# Patient Record
Sex: Male | Born: 1990 | Race: White | Hispanic: No | Marital: Single
Health system: Southern US, Community
[De-identification: ages and names within clinical notes are randomized; demographics above are authoritative.]

## PROBLEM LIST (undated history)

## (undated) DIAGNOSIS — Z915 Personal history of self-harm: Secondary | ICD-10-CM

## (undated) DIAGNOSIS — Z9151 Personal history of suicidal behavior: Secondary | ICD-10-CM

## (undated) DIAGNOSIS — F419 Anxiety disorder, unspecified: Secondary | ICD-10-CM

## (undated) DIAGNOSIS — F329 Major depressive disorder, single episode, unspecified: Secondary | ICD-10-CM

## (undated) DIAGNOSIS — F1910 Other psychoactive substance abuse, uncomplicated: Secondary | ICD-10-CM

## (undated) DIAGNOSIS — F32A Depression, unspecified: Secondary | ICD-10-CM

## (undated) DIAGNOSIS — Q860 Fetal alcohol syndrome (dysmorphic): Secondary | ICD-10-CM

---

## 2018-06-13 ENCOUNTER — Other Ambulatory Visit: Payer: Self-pay

## 2018-06-13 ENCOUNTER — Emergency Department (HOSPITAL_COMMUNITY): Payer: BLUE CROSS/BLUE SHIELD

## 2018-06-13 ENCOUNTER — Inpatient Hospital Stay (HOSPITAL_COMMUNITY): Payer: BLUE CROSS/BLUE SHIELD

## 2018-06-13 ENCOUNTER — Inpatient Hospital Stay (HOSPITAL_COMMUNITY)
Admission: EM | Admit: 2018-06-13 | Discharge: 2018-07-14 | DRG: 004 | Disposition: A | Discharge status: Other Acute Inpatient Hospital | Payer: BLUE CROSS/BLUE SHIELD | Attending: Family Medicine | Admitting: Family Medicine

## 2018-06-13 DIAGNOSIS — T50904A Poisoning by unspecified drugs, medicaments and biological substances, undetermined, initial encounter: Secondary | ICD-10-CM | POA: Diagnosis not present

## 2018-06-13 DIAGNOSIS — J150 Pneumonia due to Klebsiella pneumoniae: Secondary | ICD-10-CM | POA: Diagnosis present

## 2018-06-13 DIAGNOSIS — G931 Anoxic brain damage, not elsewhere classified: Secondary | ICD-10-CM | POA: Diagnosis present

## 2018-06-13 DIAGNOSIS — Q860 Fetal alcohol syndrome (dysmorphic): Secondary | ICD-10-CM

## 2018-06-13 DIAGNOSIS — Z7951 Long term (current) use of inhaled steroids: Secondary | ICD-10-CM

## 2018-06-13 DIAGNOSIS — F1010 Alcohol abuse, uncomplicated: Secondary | ICD-10-CM | POA: Diagnosis present

## 2018-06-13 DIAGNOSIS — Y95 Nosocomial condition: Secondary | ICD-10-CM | POA: Diagnosis not present

## 2018-06-13 DIAGNOSIS — F909 Attention-deficit hyperactivity disorder, unspecified type: Secondary | ICD-10-CM | POA: Diagnosis present

## 2018-06-13 DIAGNOSIS — Z79899 Other long term (current) drug therapy: Secondary | ICD-10-CM

## 2018-06-13 DIAGNOSIS — E46 Unspecified protein-calorie malnutrition: Secondary | ICD-10-CM | POA: Diagnosis not present

## 2018-06-13 DIAGNOSIS — Z930 Tracheostomy status: Secondary | ICD-10-CM

## 2018-06-13 DIAGNOSIS — E875 Hyperkalemia: Secondary | ICD-10-CM | POA: Diagnosis present

## 2018-06-13 DIAGNOSIS — F329 Major depressive disorder, single episode, unspecified: Secondary | ICD-10-CM | POA: Diagnosis present

## 2018-06-13 DIAGNOSIS — F1910 Other psychoactive substance abuse, uncomplicated: Secondary | ICD-10-CM | POA: Diagnosis present

## 2018-06-13 DIAGNOSIS — J9811 Atelectasis: Secondary | ICD-10-CM | POA: Diagnosis not present

## 2018-06-13 DIAGNOSIS — Z681 Body mass index (BMI) 19 or less, adult: Secondary | ICD-10-CM

## 2018-06-13 DIAGNOSIS — T5891XA Toxic effect of carbon monoxide from unspecified source, accidental (unintentional), initial encounter: Secondary | ICD-10-CM | POA: Diagnosis present

## 2018-06-13 DIAGNOSIS — E861 Hypovolemia: Secondary | ICD-10-CM | POA: Diagnosis present

## 2018-06-13 DIAGNOSIS — G92 Toxic encephalopathy: Secondary | ICD-10-CM | POA: Diagnosis present

## 2018-06-13 DIAGNOSIS — Z888 Allergy status to other drugs, medicaments and biological substances status: Secondary | ICD-10-CM

## 2018-06-13 DIAGNOSIS — A4159 Other Gram-negative sepsis: Secondary | ICD-10-CM | POA: Diagnosis not present

## 2018-06-13 DIAGNOSIS — M6282 Rhabdomyolysis: Secondary | ICD-10-CM | POA: Diagnosis present

## 2018-06-13 DIAGNOSIS — R569 Unspecified convulsions: Secondary | ICD-10-CM | POA: Diagnosis present

## 2018-06-13 DIAGNOSIS — E876 Hypokalemia: Secondary | ICD-10-CM | POA: Diagnosis not present

## 2018-06-13 DIAGNOSIS — R748 Abnormal levels of other serum enzymes: Secondary | ICD-10-CM | POA: Diagnosis not present

## 2018-06-13 DIAGNOSIS — Z7189 Other specified counseling: Secondary | ICD-10-CM

## 2018-06-13 DIAGNOSIS — F1210 Cannabis abuse, uncomplicated: Secondary | ICD-10-CM | POA: Diagnosis present

## 2018-06-13 DIAGNOSIS — R4182 Altered mental status, unspecified: Secondary | ICD-10-CM | POA: Diagnosis present

## 2018-06-13 DIAGNOSIS — Z9911 Dependence on respirator [ventilator] status: Secondary | ICD-10-CM

## 2018-06-13 DIAGNOSIS — J9621 Acute and chronic respiratory failure with hypoxia: Secondary | ICD-10-CM

## 2018-06-13 DIAGNOSIS — J9602 Acute respiratory failure with hypercapnia: Secondary | ICD-10-CM | POA: Diagnosis present

## 2018-06-13 DIAGNOSIS — F1920 Other psychoactive substance dependence, uncomplicated: Secondary | ICD-10-CM | POA: Diagnosis not present

## 2018-06-13 DIAGNOSIS — Z931 Gastrostomy status: Secondary | ICD-10-CM

## 2018-06-13 DIAGNOSIS — K7200 Acute and subacute hepatic failure without coma: Secondary | ICD-10-CM | POA: Diagnosis present

## 2018-06-13 DIAGNOSIS — N179 Acute kidney failure, unspecified: Secondary | ICD-10-CM | POA: Diagnosis present

## 2018-06-13 DIAGNOSIS — J189 Pneumonia, unspecified organism: Secondary | ICD-10-CM

## 2018-06-13 DIAGNOSIS — Z791 Long term (current) use of non-steroidal anti-inflammatories (NSAID): Secondary | ICD-10-CM

## 2018-06-13 DIAGNOSIS — Z7982 Long term (current) use of aspirin: Secondary | ICD-10-CM

## 2018-06-13 DIAGNOSIS — L8990 Pressure ulcer of unspecified site, unspecified stage: Secondary | ICD-10-CM

## 2018-06-13 DIAGNOSIS — T50902A Poisoning by unspecified drugs, medicaments and biological substances, intentional self-harm, initial encounter: Secondary | ICD-10-CM | POA: Diagnosis not present

## 2018-06-13 DIAGNOSIS — F419 Anxiety disorder, unspecified: Secondary | ICD-10-CM | POA: Diagnosis present

## 2018-06-13 DIAGNOSIS — R579 Shock, unspecified: Secondary | ICD-10-CM | POA: Diagnosis present

## 2018-06-13 DIAGNOSIS — R1319 Other dysphagia: Secondary | ICD-10-CM

## 2018-06-13 DIAGNOSIS — R0682 Tachypnea, not elsewhere classified: Secondary | ICD-10-CM

## 2018-06-13 DIAGNOSIS — R4020 Unspecified coma: Secondary | ICD-10-CM | POA: Diagnosis not present

## 2018-06-13 DIAGNOSIS — Z1159 Encounter for screening for other viral diseases: Secondary | ICD-10-CM

## 2018-06-13 DIAGNOSIS — J690 Pneumonitis due to inhalation of food and vomit: Secondary | ICD-10-CM | POA: Diagnosis present

## 2018-06-13 DIAGNOSIS — T50901A Poisoning by unspecified drugs, medicaments and biological substances, accidental (unintentional), initial encounter: Secondary | ICD-10-CM | POA: Diagnosis present

## 2018-06-13 DIAGNOSIS — J9601 Acute respiratory failure with hypoxia: Secondary | ICD-10-CM | POA: Diagnosis not present

## 2018-06-13 DIAGNOSIS — R339 Retention of urine, unspecified: Secondary | ICD-10-CM | POA: Diagnosis not present

## 2018-06-13 DIAGNOSIS — Z515 Encounter for palliative care: Secondary | ICD-10-CM

## 2018-06-13 DIAGNOSIS — R402431 Glasgow coma scale score 3-8, in the field [EMT or ambulance]: Secondary | ICD-10-CM | POA: Diagnosis not present

## 2018-06-13 DIAGNOSIS — F1410 Cocaine abuse, uncomplicated: Secondary | ICD-10-CM | POA: Diagnosis present

## 2018-06-13 DIAGNOSIS — Z915 Personal history of self-harm: Secondary | ICD-10-CM

## 2018-06-13 DIAGNOSIS — R0902 Hypoxemia: Secondary | ICD-10-CM

## 2018-06-13 DIAGNOSIS — R4702 Dysphasia: Secondary | ICD-10-CM | POA: Diagnosis not present

## 2018-06-13 DIAGNOSIS — R1310 Dysphagia, unspecified: Secondary | ICD-10-CM

## 2018-06-13 DIAGNOSIS — J9690 Respiratory failure, unspecified, unspecified whether with hypoxia or hypercapnia: Secondary | ICD-10-CM

## 2018-06-13 DIAGNOSIS — F1120 Opioid dependence, uncomplicated: Secondary | ICD-10-CM | POA: Diagnosis present

## 2018-06-13 DIAGNOSIS — Z9289 Personal history of other medical treatment: Secondary | ICD-10-CM

## 2018-06-13 DIAGNOSIS — D62 Acute posthemorrhagic anemia: Secondary | ICD-10-CM | POA: Diagnosis not present

## 2018-06-13 DIAGNOSIS — Z720 Tobacco use: Secondary | ICD-10-CM

## 2018-06-13 DIAGNOSIS — Z66 Do not resuscitate: Secondary | ICD-10-CM | POA: Diagnosis not present

## 2018-06-13 HISTORY — DX: Anxiety disorder, unspecified: F41.9

## 2018-06-13 HISTORY — DX: Fetal alcohol syndrome (dysmorphic): Q86.0

## 2018-06-13 HISTORY — DX: Major depressive disorder, single episode, unspecified: F32.9

## 2018-06-13 HISTORY — DX: Personal history of self-harm: Z91.5

## 2018-06-13 HISTORY — DX: Depression, unspecified: F32.A

## 2018-06-13 HISTORY — DX: Other psychoactive substance abuse, uncomplicated: F19.10

## 2018-06-13 HISTORY — DX: Personal history of suicidal behavior: Z91.51

## 2018-06-13 LAB — CBC WITH DIFFERENTIAL/PLATELET
Abs Immature Granulocytes: 0.9 10*3/uL — ABNORMAL HIGH (ref 0.00–0.07)
Basophils Absolute: 0 10*3/uL (ref 0.0–0.1)
Basophils Relative: 0 %
Eosinophils Absolute: 0 10*3/uL (ref 0.0–0.5)
Eosinophils Relative: 0 %
HCT: 43.9 % (ref 39.0–52.0)
Hemoglobin: 14.3 g/dL (ref 13.0–17.0)
Lymphocytes Relative: 3 %
Lymphs Abs: 0.9 10*3/uL (ref 0.7–4.0)
MCH: 30.8 pg (ref 26.0–34.0)
MCHC: 32.6 g/dL (ref 30.0–36.0)
MCV: 94.4 fL (ref 80.0–100.0)
Monocytes Absolute: 3.9 10*3/uL — ABNORMAL HIGH (ref 0.1–1.0)
Monocytes Relative: 13 %
Myelocytes: 1 %
Neutro Abs: 24.2 10*3/uL — ABNORMAL HIGH (ref 1.7–7.7)
Neutrophils Relative %: 81 %
Platelets: 264 10*3/uL (ref 150–400)
Promyelocytes Relative: 2 %
RBC: 4.65 MIL/uL (ref 4.22–5.81)
RDW: 12.5 % (ref 11.5–15.5)
WBC: 29.9 10*3/uL — ABNORMAL HIGH (ref 4.0–10.5)
nRBC: 0 % (ref 0.0–0.2)
nRBC: 0 /100 WBC

## 2018-06-13 LAB — POCT I-STAT 7, (LYTES, BLD GAS, ICA,H+H)
Acid-base deficit: 3 mmol/L — ABNORMAL HIGH (ref 0.0–2.0)
Bicarbonate: 22 mmol/L (ref 20.0–28.0)
Calcium, Ion: 1.12 mmol/L — ABNORMAL LOW (ref 1.15–1.40)
HCT: 33 % — ABNORMAL LOW (ref 39.0–52.0)
Hemoglobin: 11.2 g/dL — ABNORMAL LOW (ref 13.0–17.0)
O2 Saturation: 100 %
Potassium: 4.5 mmol/L (ref 3.5–5.1)
Sodium: 142 mmol/L (ref 135–145)
TCO2: 23 mmol/L (ref 22–32)
pCO2 arterial: 39.7 mmHg (ref 32.0–48.0)
pH, Arterial: 7.352 (ref 7.350–7.450)
pO2, Arterial: 261 mmHg — ABNORMAL HIGH (ref 83.0–108.0)

## 2018-06-13 LAB — COMPREHENSIVE METABOLIC PANEL
ALT: 1201 U/L — ABNORMAL HIGH (ref 0–44)
AST: 940 U/L — ABNORMAL HIGH (ref 15–41)
Albumin: 4 g/dL (ref 3.5–5.0)
Alkaline Phosphatase: 66 U/L (ref 38–126)
Anion gap: 14 (ref 5–15)
BUN: 18 mg/dL (ref 6–20)
CO2: 22 mmol/L (ref 22–32)
Calcium: 9.2 mg/dL (ref 8.9–10.3)
Chloride: 106 mmol/L (ref 98–111)
Creatinine, Ser: 1.81 mg/dL — ABNORMAL HIGH (ref 0.61–1.24)
GFR calc Af Amer: 59 mL/min — ABNORMAL LOW (ref >60)
GFR calc non Af Amer: 51 mL/min — ABNORMAL LOW (ref >60)
Glucose, Bld: 124 mg/dL — ABNORMAL HIGH (ref 70–99)
Potassium: 5.5 mmol/L — ABNORMAL HIGH (ref 3.5–5.1)
Sodium: 142 mmol/L (ref 135–145)
Total Bilirubin: 0.4 mg/dL (ref 0.3–1.2)
Total Protein: 7 g/dL (ref 6.5–8.1)

## 2018-06-13 LAB — RAPID URINE DRUG SCREEN, HOSP PERFORMED
Amphetamines: POSITIVE — AB
Barbiturates: NOT DETECTED
Benzodiazepines: POSITIVE — AB
Cocaine: POSITIVE — AB
Opiates: NOT DETECTED
Tetrahydrocannabinol: NOT DETECTED

## 2018-06-13 LAB — URINALYSIS, ROUTINE W REFLEX MICROSCOPIC
Bilirubin Urine: NEGATIVE
Glucose, UA: NEGATIVE mg/dL
Ketones, ur: NEGATIVE mg/dL
Leukocytes,Ua: NEGATIVE
Nitrite: NEGATIVE
Protein, ur: 100 mg/dL — AB
Specific Gravity, Urine: 1.011 (ref 1.005–1.030)
pH: 6 (ref 5.0–8.0)

## 2018-06-13 LAB — PROTIME-INR
INR: 1.2 (ref 0.8–1.2)
Prothrombin Time: 15.4 seconds — ABNORMAL HIGH (ref 11.4–15.2)

## 2018-06-13 LAB — SALICYLATE LEVEL: Salicylate Lvl: <7 mg/dL (ref 2.8–30.0)

## 2018-06-13 LAB — COOXEMETRY PANEL
Carboxyhemoglobin: 1.8 % — ABNORMAL HIGH (ref 0.5–1.5)
Methemoglobin: 1.8 % — ABNORMAL HIGH (ref 0.0–1.5)
O2 Saturation: 42.8 %
Total hemoglobin: 9.6 g/dL — ABNORMAL LOW (ref 12.0–16.0)

## 2018-06-13 LAB — PHOSPHORUS: Phosphorus: 4.8 mg/dL — ABNORMAL HIGH (ref 2.5–4.6)

## 2018-06-13 LAB — ACETAMINOPHEN LEVEL: Acetaminophen (Tylenol), Serum: <10 ug/mL — ABNORMAL LOW (ref 10–30)

## 2018-06-13 LAB — CK: Total CK: 511 U/L — ABNORMAL HIGH (ref 49–397)

## 2018-06-13 LAB — ETHANOL: Alcohol, Ethyl (B): <10 mg/dL (ref <10)

## 2018-06-13 LAB — PROCALCITONIN: Procalcitonin: 21.28 ng/mL

## 2018-06-13 LAB — LACTIC ACID, PLASMA
Lactic Acid, Venous: 7.2 mmol/L (ref 0.5–1.9)
Lactic Acid, Venous: 4.2 mmol/L (ref 0.5–1.9)

## 2018-06-13 LAB — MAGNESIUM: Magnesium: 1.8 mg/dL (ref 1.7–2.4)

## 2018-06-13 LAB — MRSA PCR SCREENING: MRSA by PCR: NEGATIVE

## 2018-06-13 LAB — SARS CORONAVIRUS 2 BY RT PCR (HOSPITAL ORDER, PERFORMED IN ~~LOC~~ HOSPITAL LAB): SARS Coronavirus 2: NEGATIVE

## 2018-06-13 MED ORDER — SODIUM CHLORIDE 0.9 % IV SOLN
INTRAVENOUS | Status: AC | PRN
  Administered 2018-06-13: 1000 mL via INTRAVENOUS

## 2018-06-13 MED ORDER — FENTANYL CITRATE (PF) 100 MCG/2ML IJ SOLN
50.0000 ug | INTRAMUSCULAR | Status: DC | PRN
  Administered 2018-06-13: 20:00:00 50 ug via INTRAVENOUS

## 2018-06-13 MED ORDER — SODIUM CHLORIDE 0.9% FLUSH
3.0000 mL | Freq: Once | INTRAVENOUS | Status: AC
  Administered 2018-06-13: 19:00:00 3 mL via INTRAVENOUS

## 2018-06-13 MED ORDER — ALBUTEROL SULFATE (2.5 MG/3ML) 0.083% IN NEBU: 2.5000 mg | INHALATION_SOLUTION | RESPIRATORY_TRACT | Status: DC | PRN

## 2018-06-13 MED ORDER — FENTANYL CITRATE (PF) 100 MCG/2ML IJ SOLN
INTRAMUSCULAR | Status: AC
  Filled 2018-06-13: qty 2

## 2018-06-13 MED ORDER — CHLORHEXIDINE GLUCONATE 0.12% ORAL RINSE (MEDLINE KIT)
15.0000 mL | Freq: Two times a day (BID) | OROMUCOSAL | Status: DC
  Administered 2018-06-13 – 2018-07-01 (×36): 15 mL via OROMUCOSAL
  Filled 2018-06-13: qty 15

## 2018-06-13 MED ORDER — PROPOFOL 1000 MG/100ML IV EMUL
5.0000 ug/kg/min | INTRAVENOUS | Status: DC
  Administered 2018-06-13: 5 ug/kg/min via INTRAVENOUS

## 2018-06-13 MED ORDER — BISACODYL 10 MG RE SUPP: 10.0000 mg | Freq: Every day | RECTAL | Status: DC | PRN

## 2018-06-13 MED ORDER — ONDANSETRON HCL 4 MG/2ML IJ SOLN: 4.0000 mg | Freq: Four times a day (QID) | INTRAMUSCULAR | Status: DC | PRN

## 2018-06-13 MED ORDER — MIDAZOLAM HCL 2 MG/2ML IJ SOLN
2.0000 mg | INTRAMUSCULAR | Status: DC | PRN
  Administered 2018-06-14: 03:00:00 2 mg via INTRAVENOUS
  Filled 2018-06-13: qty 2

## 2018-06-13 MED ORDER — SODIUM CHLORIDE 0.9 % IV SOLN
250.0000 mL | INTRAVENOUS | Status: DC
  Administered 2018-06-17 – 2018-07-06 (×5): 250 mL via INTRAVENOUS

## 2018-06-13 MED ORDER — PANTOPRAZOLE SODIUM 40 MG PO PACK
40.0000 mg | PACK | Freq: Every day | ORAL | Status: DC
  Administered 2018-06-13 – 2018-06-25 (×12): 40 mg
  Filled 2018-06-13 (×11): qty 20

## 2018-06-13 MED ORDER — LACTATED RINGERS IV BOLUS
1000.0000 mL | Freq: Once | INTRAVENOUS | Status: AC
  Administered 2018-06-13: 19:00:00 1000 mL via INTRAVENOUS

## 2018-06-13 MED ORDER — PHENYLEPHRINE 40 MCG/ML (10ML) SYRINGE FOR IV PUSH (FOR BLOOD PRESSURE SUPPORT)
PREFILLED_SYRINGE | INTRAVENOUS | Status: AC
  Filled 2018-06-13: qty 10

## 2018-06-13 MED ORDER — ETOMIDATE 2 MG/ML IV SOLN
INTRAVENOUS | Status: AC | PRN
  Administered 2018-06-13: 10 mg via INTRAVENOUS

## 2018-06-13 MED ORDER — LORAZEPAM 2 MG/ML IJ SOLN
INTRAMUSCULAR | Status: AC
  Filled 2018-06-13: qty 1

## 2018-06-13 MED ORDER — SODIUM CHLORIDE 0.9 % IV BOLUS
1000.0000 mL | Freq: Once | INTRAVENOUS | Status: AC
  Administered 2018-06-13: 16:00:00 1000 mL via INTRAVENOUS

## 2018-06-13 MED ORDER — LORAZEPAM 2 MG/ML IJ SOLN
2.0000 mg | Freq: Once | INTRAMUSCULAR | Status: AC
  Administered 2018-06-13: 16:00:00 2 mg via INTRAVENOUS

## 2018-06-13 MED ORDER — SODIUM CHLORIDE 0.9 % IV SOLN
2000.0000 mg | Freq: Once | INTRAVENOUS | Status: AC
  Administered 2018-06-13: 17:00:00 2000 mg via INTRAVENOUS
  Filled 2018-06-13: qty 20

## 2018-06-13 MED ORDER — SODIUM BICARBONATE 8.4 % IV SOLN
50.0000 meq | Freq: Once | INTRAVENOUS | Status: AC
  Administered 2018-06-13: 50 meq via INTRAVENOUS
  Filled 2018-06-13: qty 50

## 2018-06-13 MED ORDER — MIDAZOLAM HCL 2 MG/2ML IJ SOLN
2.0000 mg | Freq: Once | INTRAMUSCULAR | Status: AC
  Administered 2018-06-13: 20:00:00 2 mg via INTRAVENOUS

## 2018-06-13 MED ORDER — ROCURONIUM BROMIDE 50 MG/5ML IV SOLN
INTRAVENOUS | Status: AC | PRN
  Administered 2018-06-13: 80 mg via INTRAVENOUS

## 2018-06-13 MED ORDER — MIDAZOLAM HCL 2 MG/2ML IJ SOLN
INTRAMUSCULAR | Status: AC
  Filled 2018-06-13: qty 8

## 2018-06-13 MED ORDER — NOREPINEPHRINE 4 MG/250ML-% IV SOLN
0.0000 ug/min | INTRAVENOUS | Status: DC
  Filled 2018-06-13: qty 250

## 2018-06-13 MED ORDER — PANTOPRAZOLE SODIUM 40 MG IV SOLR
40.0000 mg | Freq: Every day | INTRAVENOUS | Status: DC
  Filled 2018-06-13: qty 40

## 2018-06-13 MED ORDER — MIDAZOLAM HCL 2 MG/2ML IJ SOLN: 2.0000 mg | INTRAMUSCULAR | Status: DC | PRN

## 2018-06-13 MED ORDER — DOCUSATE SODIUM 50 MG/5ML PO LIQD
100.0000 mg | Freq: Two times a day (BID) | ORAL | Status: DC | PRN
  Filled 2018-06-13: qty 10

## 2018-06-13 MED ORDER — ORAL CARE MOUTH RINSE
15.0000 mL | OROMUCOSAL | Status: DC
  Administered 2018-06-13 – 2018-07-09 (×240): 15 mL via OROMUCOSAL

## 2018-06-13 MED ORDER — FENTANYL CITRATE (PF) 100 MCG/2ML IJ SOLN: 50.0000 ug | INTRAMUSCULAR | Status: DC | PRN

## 2018-06-13 MED ORDER — NOREPINEPHRINE 4 MG/250ML-% IV SOLN
2.0000 ug/min | INTRAVENOUS | Status: DC
  Administered 2018-06-13: 18:00:00 4 ug/min via INTRAVENOUS

## 2018-06-13 NOTE — ED Notes (Signed)
Pt attempting to reach for tube with L hand.

## 2018-06-13 NOTE — Consult Note (Signed)
Neurology Consultation  Reason for Consult: found in car with AMS Referring Physician: Pilar PlateBero  CC: AMS  History is obtained from: Chart  HPI: Jesse [name] is a 28 y.o. male with no history in chart. HE was found in a parking lot of a hotel in his car, with engine running [facility], chills.  Person who worked at hotel he was noted at 7 am with lights and car running. When police arrived he was completely unresponsive.  EMS called - found white powder on arm rest and lots of pill medications in a bag.  [facility], and intubated.  Exhibited upward gaze and extensor posturing of both extremities along with shivering. Question of seizure versus drug OD.  Received Narcan with no improvement. Received Versed in the field and benzos in the emergency room also with no improvement in clinical status.  ED course: Unable to protect airway hence intubation, CT head   ROS:  Unable to obtain due to altered mental status.   No past medical history on file and unable to get from patient  No family history on file to review-patient unable to provide due to mentation  Social History: no history in file - patient unable to provide  Medications  Current Facility-Administered Medications:  .  LORazepam (ATIVAN) 2 MG/ML injection, , , ,  .  propofol (DIPRIVAN) 1000 MG/100ML infusion, 5-80 mcg/kg/min, Intravenous, Continuous, Sabas SousBero, Udell M, MD, 5 mcg/kg/min at 06/13/18 1623 .  sodium chloride flush (NS) 0.9 % injection 3 mL, 3 mL, Intravenous, Once, Bero, Elmer SowMichael M, MD No current outpatient medications on file.   Exam: Current vital signs: Ht 6' (1.829 m)   SpO2 100%  Vital signs in last 24 hours: SpO2:  [100 %] 100 % (04/20 1621) FiO2 (%):  [100 %] 100 % (04/20 1557) SBP 90s Intubated saturating at 100% Physical Exam  Constitutional: Appears well-developed and well-nourished.  Eyes: No scleral injection HENT: [place] AT intubated Cardiovascular: Normal  rate and regular rhythm.  Respiratory: Effort normal, non-labored breathing GI: Soft.  No distension. There is no tenderness.  Skin: WDI Neuro: Mental Status: Patient does not respond to verbal stimuli.   To nox stim, he near about localizes more with left than compared to right upper ext. To nox stim, on lower ext - he tries withdrawing followed by almost extensor posturing.  Cranial Nerves: II: patient does not respond confrontation bilaterally,  III,IV,VI: doll's response absent bilaterally. pupils right 3 mm, left 3 mm,and reactive bilaterally V,VII: corneal reflex present bilaterally  VIII: patient does not respond to verbal stimuli IX,X: gag reflex present, XI: trapezius strength unable to test bilaterally XII: tongue strength unable to test Motor: Extremities flaccid throughout.  No spontaneous movement noted.  No purposeful movements noted. Sensory: Does not respond to noxious stimuli in any extremity. Deep Tendon Reflexes:  Absent throughout. Plantars: upgoing bilaterally Cerebellar: Unable to perform  Labs I have reviewed labs in epic and the results pertinent to this consultation are: WBC 29.9, lactate 7.2, CK 511, creatinine 1.81, potassium 5.5, AST 940, ALT 1201, GFR 51, glucose 124, CBC    Component Value Date/Time   WBC 29.9 (H) 06/13/2018 1609   RBC 4.65 06/13/2018 1609   HGB 14.3 06/13/2018 1609   HCT 43.9 06/13/2018 1609   PLT 264 06/13/2018 1609   MCV 94.4 06/13/2018 1609   MCH 30.8 06/13/2018 1609   MCHC 32.6 06/13/2018 1609   RDW 12.5 06/13/2018 1609  LYMPHSABS 0.9 06/13/2018 1609   MONOABS 3.9 (H) 06/13/2018 1609   EOSABS 0.0 06/13/2018 1609   BASOSABS 0.0 06/13/2018 1609   CMP     Component Value Date/Time   NA 142 06/13/2018 1609   K 5.5 (H) 06/13/2018 1609   CL 106 06/13/2018 1609   CO2 22 06/13/2018 1609   GLUCOSE 124 (H) 06/13/2018 1609   BUN 18 06/13/2018 1609   CREATININE 1.81 (H) 06/13/2018 1609   CALCIUM 9.2 06/13/2018 1609    PROT 7.0 06/13/2018 1609   ALBUMIN 4.0 06/13/2018 1609   AST 940 (H) 06/13/2018 1609   ALT 1,201 (H) 06/13/2018 1609   ALKPHOS 66 06/13/2018 1609   BILITOT 0.4 06/13/2018 1609   GFRNONAA 51 (L) 06/13/2018 1609   GFRAA 59 (L) 06/13/2018 1609   Imaging I have reviewed the images obtained: CT-scan of the brain--no bleed. Possible hypodensity in bilateral basal ganglia. Official read negative for acute process.   M-F  (9:00 am- 5:00 PM)  06/13/2018, 5:14 PM   Attending neuro hospitalist addendum I have seen and examined the patient. I have independently reviewed imaging-my impression above. History has been obtained from the chart and the [facility] by his room familiar with his case. Unresponsiveness in the car with running engine and running [facility] for unknown duration of time with suspicious-looking powder found on the armrest and significant number of pills for medications found in the car. Intubated in the ED for airway protection. My examination that I performed has been documented above by me.  Has intact brainstem reflexes.  Some purposeful localization with left upper extremity and extensor posturing on both lower extremities. Patient was in isolation due to no history available and possible normal coronavirus and examination was done with PPE.   Assessment: 28 year old man with no history available and no past medical history available, found in a parking lot in his car unresponsive with running engine and running [facility] with unknown downtime.  No report of cardiac arrest. Brought in unable to protect his airway and had to be intubated. There is question of seizure activity with upward eye deviation versus whole body shivering. Neurological consultation for rule out status epilepticus. Noncontrast CT of the head is suspicious for either anoxic injury or carbon monoxide poisoning due to the nature of bilateral symmetric basal ganglia hypodensities. Further  imaging and electrographic studies would be helpful along with further lab studies.  Impression: Toxic metabolic encephalopathy Evaluate for carbon monoxide poisoning Evaluate for drug overdose  Recommendations: MRI of the brain stat when possible Stat EEG-technician notified Urinary toxicology screen Test blood for carbon monoxide - cooximetry panel Loaded with Keppra 2g IV in ER Will decide on AEDs based on clinical course, imaging and EEG results.  -- Milon Dikes, MD [facility] Pager: [phone] If 7pm to 7am, please call on call as listed on AMION.  CRITICAL CARE ATTESTATION Performed by: Milon Dikes, MD Total critical care time:  60 minutes Critical care time was exclusive of separately billable procedures and treating other patients and/or supervising APPs/Residents/Students Critical care was necessary to treat or prevent imminent or life-threatening deterioration due to toxic matabolic encephalopathy, possibly hypoxic/anoxic brain injury, possible drug overdose. This patient is critically ill and at significant risk for neurological worsening and/or death and care requires constant monitoring. Critical care was time spent personally by me on the following activities: development of treatment plan with patient and/or surrogate as well as nursing, discussions with consultants, evaluation of patient's response to  treatment, examination of patient, obtaining history from patient or surrogate, ordering and performing treatments and interventions, ordering and review of laboratory studies, ordering and review of radiographic studies, pulse oximetry, re-evaluation of patient's condition, participation in multidisciplinary rounds and medical decision making of high complexity in the care of this patient.    Addendum That EEG performed reviewed.  Formal read pending.  Preliminary review-EEG not concerning for seizures or status epilepticus. No need for  antiepileptics Obtain MRI of the brain and MRA of the head to rule out any basilar pathology although drug screen has come back positive now and is positive for cocaine and amphetamines along with benzodiazepines which might have been the benzos given in the field by EMS. Suspect toxic/anoxic/hypoxic ischemic encephalopathy due to the drugs as well as possible car monoxide exposure due to running car engine. Pulmonary critical care admitting the patient.  Had a detailed discussion with the PCCM providers at bedside. We will continue to follow with you.   -- Milon Dikes, MD [facility] Pager: [phone] If 7pm to 7am, please call on call as listed on AMION.

## 2018-06-13 NOTE — H&P (Signed)
NAME:  Jesse [name], MRN:  [id number], DOB:  July 28, 1990, LOS: 0 ADMISSION DATE:  06/13/2018, CONSULTATION DATE: June 13, 2018 REFERRING MD: Dr. Pilar Plate EDP, CHIEF COMPLAINT: Altered mental status  Brief History   28 year old male found down in car with suspected drug overdose versus status epilepticus.  Intubated in the emergency department.  History of present illness   28 year old male with no known medical history.  He was found to be unresponsive in his parked car in a hotel parking lot by an employee there.  Upon police arrival the patient was unresponsive in the car with a white powder on the armrest in the bag with several unmarked pills.  He was given Narcan at the scene which did not elicit a response.  He was given Versed in hopes to abate any potential seizure activity, which also did not elicit a response.  He was transported to the emergency department where he was intubated for airway protection.  CT scan of the head was done and revealed bilateral basal ganglia hypodensities.  Neurology was consulted and was concern for carbon monoxide poisoning or some other hypoxemic injury based on CT findings.  PCCM was called for admission.  Past Medical History  unknown  Significant Hospital Events   4/20 admit > intubated.   Consults:  Neurology 4/20 >  Procedures:  ETT 4/20 >  Significant Diagnostic Tests:  CT head 4/20 > official read as non-acute. Neurology feels there are bilateral basal ganglia hypodensities.   Micro Data:  Blood 4/20 > Trach aspirate 4/20 >  Antimicrobials:   Interim history/subjective:    Objective   Height 6' (1.829 m), SpO2 100 %.    Vent Mode: PRVC FiO2 (%):  [100 %] 100 % Vt Set:  [620 mL] 620 mL PEEP:  [5 cmH20] 5 cmH20 Plateau Pressure:  [13 cmH20] 13 cmH20   Intake/Output Summary (Last 24 hours) at 06/13/2018 1755 Last data filed at 06/13/2018 1645 Gross per 24 hour  Intake 112.64 ml  Output -  Net 112.64 ml   There were no  vitals filed for this visit.  Examination: General: Young adult male on vent HENT: Pupils 50mm and sluggish response to light.  Lungs: Clear bilateral breath sounds. Synchronous with vent.  Cardiovascular: RRR, no MRG Abdomen: Soft, non-distended Extremities: NO acute deformity. No edema.  Neuro: Localizes to pain.   Resolved Hospital Problem list     Assessment & Plan:   Acute encephalopathy: Felt to be secondary to metabolic derangement or toxic ingestion. He was loaded with Keppra, but EEG done in ED and read by Neurohospitalist felt to not represent seizure, but diffuse slowing. UDS on admit positive for amphetamines, cocaine, and benzos (versed given in field) - Admit to ICU  - MRIMRA en route to ICU - Minimize sedation after MRI done.  - Neurology following - DC Keppra  Circulatory shock: etiology unclear. No clear septic source. Has been adequately volume resuscitated with 3L in ED. No cardiac history, but is cocaine positive so cannot exclude cardiogenic cause. Possibly neurogenic given presentation and CT changes.  - Telemetry monitoring - Levophed to keep MAP > - Echo  - 1L LR bolus now - Ensure lactic clearing (7.2)  Inability to protect airway - Full vent support - VAP bundle - ABG reviewed and settings adjusted - CXR in AM  SIRS: Leukocytosis and hypothermia. No clear infectious focus.  - Monitor off ABX - Cultures pending - PCT - Low threshold to add ABX  AKI: secondary to hypovolemia and rhabdomyolysis (CK 511) Hyperkalemia - Hydrate (4L in ED) - Trend CK - Follow BMP   Elevated LFT - Repeat LFT   Best practice:  Diet: NPO Pain/Anxiety/Delirium protocol (if indicated): PRN sedation VAP protocol (if indicated): Per protocol DVT prophylaxis: SCDs GI prophylaxis: Protonix Glucose control: N/a Mobility: BR Code Status: Full Family Communication: No family known Disposition: ICU  Labs   CBC: Recent Labs  Lab 06/13/18 1609  WBC 29.9*   NEUTROABS 24.2*  HGB 14.3  HCT 43.9  MCV 94.4  PLT 264    Basic Metabolic Panel: Recent Labs  Lab 06/13/18 1609  NA 142  K 5.5*  CL 106  CO2 22  GLUCOSE 124*  BUN 18  CREATININE 1.81*  CALCIUM 9.2   GFR: CrCl cannot be calculated (Unknown ideal weight.). Recent Labs  Lab 06/13/18 1609  WBC 29.9*  LATICACIDVEN 7.2*    Liver Function Tests: Recent Labs  Lab 06/13/18 1609  AST 940*  ALT 1,201*  ALKPHOS 66  BILITOT 0.4  PROT 7.0  ALBUMIN 4.0   No results for input(s): LIPASE, AMYLASE in the last 168 hours. No results for input(s): AMMONIA in the last 168 hours.  ABG No results found for: PHART, PCO2ART, PO2ART, HCO3, TCO2, ACIDBASEDEF, O2SAT   Coagulation Profile: Recent Labs  Lab 06/13/18 1609  INR 1.2    Cardiac Enzymes: Recent Labs  Lab 06/13/18 1609  CKTOTAL 511*    HbA1C: No results found for: HGBA1C  CBG: No results for input(s): GLUCAP in the last 168 hours.  Review of Systems:   Unable as patient is encephalopathic and intubated  Past Medical History  He,  has no past medical history on file.   Surgical History     Social History      Family History   His family history is not on file.   Allergies Allergies not on file   Home Medications  Prior to Admission medications   Not on File     Critical care time: 45 mins     Joneen RoachPaul Josalynn Johndrow, AGACNP-BC [facility] Pager [phone] or [phone]  06/13/2018 6:39 PM

## 2018-06-13 NOTE — ED Triage Notes (Signed)
Pt here via [facility] after being found in his car, unresponsive, with car running the air conditioner on.  Given narcan x 1 with no response.  Given 1L NS for bp in 80's, hr 50's, placed on nrb for sats of 88%, rr 12.  Given 2.5 mg versed for seizure-like activity.

## 2018-06-13 NOTE — Progress Notes (Signed)
EEG Complete  Results Pending 

## 2018-06-13 NOTE — Progress Notes (Signed)
Transported patient to MRI while patient was on the vent. Patient remained stable during the transport.

## 2018-06-13 NOTE — ED Notes (Signed)
In MRI

## 2018-06-13 NOTE — ED Provider Notes (Signed)
[facility] Emergency Department Provider Note MRN:  [id number]  Arrival date & time: 06/13/18     Chief Complaint   Altered mental status History of Present Illness   Jesse [name] is a 28 y.o. year-old male with unknown past medical history presenting to the ED with chief complaint of altered mental status.  Patient was found in his car with the car running unresponsive.  Was parked outside in the parking lot of a hotel.  Noted to be rigid with EMS.  Given 2-1/2 mg IV midazolam without effect.  Review of Systems  Positive for altered mental status.  Patient's Health History   No past medical history on file.    No family history on file.  Social History   Socioeconomic History  . Marital status: Single    Spouse name: Not on file  . Number of children: Not on file  . Years of education: Not on file  . Highest education level: Not on file  Occupational History  . Not on file  Social Needs  . Financial resource strain: Not on file  . Food insecurity:    Worry: Not on file    Inability: Not on file  . Transportation needs:    Medical: Not on file    Non-medical: Not on file  Tobacco Use  . Smoking status: Not on file  Substance and Sexual Activity  . Alcohol use: Not on file  . Drug use: Not on file  . Sexual activity: Not on file  Lifestyle  . Physical activity:    Days per week: Not on file    Minutes per session: Not on file  . Stress: Not on file  Relationships  . Social connections:    Talks on phone: Not on file    Gets together: Not on file    Attends religious service: Not on file    Active member of club or organization: Not on file    Attends meetings of clubs or organizations: Not on file    Relationship status: Not on file  . Intimate partner violence:    Fear of current or ex partner: Not on file    Emotionally abused: Not on file    Physically abused: Not on file    Forced sexual activity: Not on file  Other Topics  Concern  . Not on file  Social History Narrative  . Not on file     Physical Exam  Vital Signs and Nursing Notes reviewed Vitals:   06/13/18 1827 06/13/18 1830  BP: (!) 148/87 (!) 146/88  Pulse: 75 76  Resp: 18 15  Temp: (!) 96 F (35.6 C) (!) 96.1 F (35.6 C)  SpO2: 99% 99%    CONSTITUTIONAL: Ill-appearing, rigid, cold extremities NEURO: Somnolent and unresponsive, increased tone, extensor posturing of the lower extremities, eye deviation upward EYES:  eyes equal and reactive ENT/NECK:  no LAD, no JVD CARDIO: Tachycardic rate, normal S1 and S2 PULM:  CTAB no wheezing or rhonchi GI/GU:  normal bowel sounds, non-distended, non-tender MSK/SPINE:  No gross deformities, no edema SKIN:  no rash, atraumatic PSYCH: Unable to assess  Diagnostic and Interventional Summary    EKG Interpretation  Date/Time:  Monday June 13 2018 16:10:08 EDT Ventricular Rate:  105 PR Interval:    QRS Duration: 116 QT Interval:  384 QTC Calculation: 508 R Axis:   74 Text Interpretation:  Sinus tachycardia Nonspecific intraventricular conduction delay no prior Confirmed by Kennis Carina [phone]) on 06/13/2018  4:24:14 PM      Labs Reviewed  COMPREHENSIVE METABOLIC PANEL - Abnormal; Notable for the following components:      Result Value   Potassium 5.5 (*)    Glucose, Bld 124 (*)    Creatinine, Ser 1.81 (*)    AST 940 (*)    ALT 1,201 (*)    GFR calc non Af Amer 51 (*)    GFR calc Af Amer 59 (*)    All other components within normal limits  LACTIC ACID, PLASMA - Abnormal; Notable for the following components:   Lactic Acid, Venous 7.2 (*)    All other components within normal limits  CBC WITH DIFFERENTIAL/PLATELET - Abnormal; Notable for the following components:   WBC 29.9 (*)    Neutro Abs 24.2 (*)    Monocytes Absolute 3.9 (*)    Abs Immature Granulocytes 0.90 (*)    All other components within normal limits  PROTIME-INR - Abnormal; Notable for the following components:    Prothrombin Time 15.4 (*)    All other components within normal limits  URINALYSIS, ROUTINE W REFLEX MICROSCOPIC - Abnormal; Notable for the following components:   Hgb urine dipstick LARGE (*)    Protein, ur 100 (*)    Bacteria, UA RARE (*)    All other components within normal limits  RAPID URINE DRUG SCREEN, HOSP PERFORMED - Abnormal; Notable for the following components:   Cocaine POSITIVE (*)    Benzodiazepines POSITIVE (*)    Amphetamines POSITIVE (*)    All other components within normal limits  CK - Abnormal; Notable for the following components:   Total CK 511 (*)    All other components within normal limits  ACETAMINOPHEN LEVEL - Abnormal; Notable for the following components:   Acetaminophen (Tylenol), Serum <10 (*)    All other components within normal limits  COOXEMETRY PANEL - Abnormal; Notable for the following components:   Total hemoglobin 9.6 (*)    Carboxyhemoglobin 1.8 (*)    Methemoglobin 1.8 (*)    All other components within normal limits  POCT I-STAT 7, (LYTES, BLD GAS, ICA,H+H) - Abnormal; Notable for the following components:   pO2, Arterial 261.0 (*)    Acid-base deficit 3.0 (*)    Calcium, Ion 1.12 (*)    HCT 33.0 (*)    Hemoglobin 11.2 (*)    All other components within normal limits  SARS CORONAVIRUS 2 (HOSPITAL ORDER, PERFORMED IN [facility])  CULTURE, BLOOD (ROUTINE X 2)  CULTURE, BLOOD (ROUTINE X 2)  URINE CULTURE  CULTURE, RESPIRATORY  ETHANOL  SALICYLATE LEVEL  BLOOD GAS, ARTERIAL  HIV ANTIBODY (ROUTINE TESTING W REFLEX)  CARBOXYHEMOGLOBIN  CBC  MAGNESIUM  PHOSPHORUS  CARBON MONOXIDE, BLOOD (PERFORMED AT REF LAB)  PROCALCITONIN  PROCALCITONIN  LACTIC ACID, PLASMA  LACTIC ACID, PLASMA  CK  COMPREHENSIVE METABOLIC PANEL  MAGNESIUM  PHOSPHORUS    CT Head Wo Contrast  Final Result    DG Chest Portable 1 View  Final Result    MR BRAIN WO CONTRAST    (Results Pending)  DG Chest Port 1 View    (Results Pending)  MR  MRA HEAD WO CONTRAST    (Results Pending)    Medications  LORazepam (ATIVAN) 2 MG/ML injection (has no administration in time range)  sodium chloride flush (NS) 0.9 % injection 3 mL (has no administration in time range)  pantoprazole (PROTONIX) injection 40 mg (has no administration in time range)  ondansetron (ZOFRAN) injection 4  mg (has no administration in time range)  albuterol (PROVENTIL) (2.5 MG/3ML) 0.083% nebulizer solution 2.5 mg (has no administration in time range)  fentaNYL (SUBLIMAZE) injection 50 mcg (has no administration in time range)  fentaNYL (SUBLIMAZE) injection 50-200 mcg (has no administration in time range)  midazolam (VERSED) injection 2 mg (has no administration in time range)  midazolam (VERSED) injection 2 mg (has no administration in time range)  docusate (COLACE) 50 MG/5ML liquid 100 mg (has no administration in time range)  bisacodyl (DULCOLAX) suppository 10 mg (has no administration in time range)  0.9 %  sodium chloride infusion (has no administration in time range)  norepinephrine (LEVOPHED)  in premix infusion (20 mcg/min Intravenous Rate/Dose Change 06/13/18 1815)  phenylephrine 0.4-0.9 MG/10ML-% injection (has no administration in time range)  0.9 %  sodium chloride infusion (1,000 mLs Intravenous New Bag/Given 06/13/18 1828)  lactated ringers bolus 1,000 mL (has no administration in time range)  midazolam (VERSED) injection 2 mg (has no administration in time range)  LORazepam (ATIVAN) injection 2 mg (2 mg Intravenous Given 06/13/18 1551)  etomidate (AMIDATE) injection (10 mg Intravenous Given 06/13/18 1554)  rocuronium (ZEMURON) injection (80 mg Intravenous Given 06/13/18 1555)  levETIRAcetam (KEPPRA) 2,000 mg in sodium chloride 0.9 % 100 mL IVPB (0 mg Intravenous Stopped 06/13/18 1645)  sodium chloride 0.9 % bolus 1,000 mL (1,000 mLs Intravenous New Bag/Given 06/13/18 1600)  sodium bicarbonate injection 50 mEq (50 mEq Intravenous Given 06/13/18 1625)   0.9 %  sodium chloride infusion (1,000 mLs Intravenous New Bag/Given 06/13/18 1730)     Procedure Name: Intubation Date/Time: 06/13/2018 4:13 PM Performed by: Sabas Sous, MD Pre-anesthesia Checklist: Patient identified, Emergency Drugs available, Suction available and Patient being monitored Oxygen Delivery Method: Non-rebreather mask Preoxygenation: Pre-oxygenation with 100% oxygen Induction Type: IV induction and Rapid sequence Laryngoscope Size: Glidescope and 3 Grade View: Grade I Tube size: 7.5 mm Number of attempts: 1 Placement Confirmation: ETT inserted through vocal cords under direct vision,  Positive ETCO2 and Breath sounds checked- equal and bilateral Secured at: 23 cm Comments: Indication for intubation: Status epilepticus RSI with 10 mg times a day and 80 mg rocuronium      Critical Care Critical Care Documentation Critical care time provided by me (excluding procedures): 44 minutes  Condition necessitating critical care: Concern for acute drug overdose, concern for status epilepticus, inadequate airway protection  Components of critical care management: reviewing of prior records, laboratory and imaging interpretation, frequent re-examination and reassessment of vital signs, administration of IV Ativan, ventilatory management, discussion with consulting services    ED Course and Medical Decision Making  I have reviewed the triage vital signs and the nursing notes.  Pertinent labs & imaging results that were available during my care of the patient were reviewed by me and considered in my medical decision making (see below for details).  Concern for status epilepticus in this 28 year old male, unknown past medical history but upon chart review no mention of seizure history.  Patient was given midazolam as well as Narcan in the field without effect.  Given additional 2 mg Ativan here in the emergency department without effect, continued sonorous breathing,  clenched jaw, increased tone to the entire body and lower extremities, eye deviation upward.  Concern for continued status epilepticus, need for imaging, need for airway protection.  Intubated as described above.  Will provide with propofol for sedation, will load with Keppra, obtain CT head, consult neurology.  Clinical Course as of Jun 13 1847  Mon Jun 13, 2018  1621 No prior EKG but today's EKG with nonspecific conduction delay, prolonged QRS, prolonged QT, raising some concern for overdose.  Provided with 1 amp sodium bicarb empirically and will monitor closely.   [MB]    Clinical Course User Index [MB] Sabas SousBero, Sirr M, MD     Evaluated by neurology with EEG monitoring that does not reveal seizure activity.  Neurology favoring more of an anoxic state causing his presentation in the setting of drug overdose.  Patient began exhibiting hypotension with systolics in the 50s to 60s requiring norepinephrine drip.  Suspect underlying benzodiazepine overdose.  Admitted to the intensivist service for further care.  Elmer SowMichael M. Pilar PlateBero, MD [facility] [email or web address]  Final Clinical Impressions(s) / ED Diagnoses     ICD-10-CM   1. Drug overdose, undetermined intent, initial encounter T50.904A   2. Respiratory failure (HCC) J96.90 [facility]    DG Chest Port 1 View  3. Shock (HCC) R57.9   4. Altered mental status, unspecified altered mental status type R41.82     ED Discharge Orders    None         Sabas SousBero, Iktan M, MD 06/13/18 [phone]

## 2018-06-13 NOTE — Progress Notes (Addendum)
CRITICAL VALUE ALERT  Critical Value: Lactic Acid 4.2   Date & Time Notied:  06/13/2018 & 2300   Provider Notified: Pola Corn RN - Jody   Orders Received/Actions taken: No new orders

## 2018-06-13 NOTE — ED Notes (Signed)
Pressures dropping.  Propofol stopped, bolus started.

## 2018-06-14 ENCOUNTER — Inpatient Hospital Stay (HOSPITAL_COMMUNITY): Payer: BLUE CROSS/BLUE SHIELD

## 2018-06-14 DIAGNOSIS — J9601 Acute respiratory failure with hypoxia: Secondary | ICD-10-CM

## 2018-06-14 LAB — CK: Total CK: 13371 U/L — ABNORMAL HIGH (ref 49–397)

## 2018-06-14 LAB — GLUCOSE, CAPILLARY
Glucose-Capillary: 101 mg/dL — ABNORMAL HIGH (ref 70–99)
Glucose-Capillary: 110 mg/dL — ABNORMAL HIGH (ref 70–99)
Glucose-Capillary: 123 mg/dL — ABNORMAL HIGH (ref 70–99)
Glucose-Capillary: 142 mg/dL — ABNORMAL HIGH (ref 70–99)

## 2018-06-14 LAB — CBC
HCT: 42.1 % (ref 39.0–52.0)
Hemoglobin: 14.8 g/dL (ref 13.0–17.0)
MCH: 31.1 pg (ref 26.0–34.0)
MCHC: 35.2 g/dL (ref 30.0–36.0)
MCV: 88.4 fL (ref 80.0–100.0)
Platelets: 226 10*3/uL (ref 150–400)
RBC: 4.76 MIL/uL (ref 4.22–5.81)
RDW: 12.4 % (ref 11.5–15.5)
WBC: 16.8 10*3/uL — ABNORMAL HIGH (ref 4.0–10.5)
nRBC: 0 % (ref 0.0–0.2)

## 2018-06-14 LAB — HIV ANTIBODY (ROUTINE TESTING W REFLEX): HIV Screen 4th Generation wRfx: NONREACTIVE

## 2018-06-14 LAB — ECHOCARDIOGRAM LIMITED
Height: 72 in
Weight: 2857.16 oz

## 2018-06-14 LAB — TRIGLYCERIDES: Triglycerides: 69 mg/dL (ref <150)

## 2018-06-14 LAB — PHOSPHORUS: Phosphorus: 3.5 mg/dL (ref 2.5–4.6)

## 2018-06-14 LAB — MAGNESIUM: Magnesium: 1.5 mg/dL — ABNORMAL LOW (ref 1.7–2.4)

## 2018-06-14 LAB — COMPREHENSIVE METABOLIC PANEL
ALT: 1382 U/L — ABNORMAL HIGH (ref 0–44)
AST: 1516 U/L — ABNORMAL HIGH (ref 15–41)
Albumin: 3.6 g/dL (ref 3.5–5.0)
Alkaline Phosphatase: 63 U/L (ref 38–126)
Anion gap: 10 (ref 5–15)
BUN: 17 mg/dL (ref 6–20)
CO2: 24 mmol/L (ref 22–32)
Calcium: 9 mg/dL (ref 8.9–10.3)
Chloride: 107 mmol/L (ref 98–111)
Creatinine, Ser: 1.09 mg/dL (ref 0.61–1.24)
GFR calc Af Amer: >60 mL/min (ref >60)
GFR calc non Af Amer: >60 mL/min (ref >60)
Glucose, Bld: 100 mg/dL — ABNORMAL HIGH (ref 70–99)
Potassium: 4 mmol/L (ref 3.5–5.1)
Sodium: 141 mmol/L (ref 135–145)
Total Bilirubin: 0.8 mg/dL (ref 0.3–1.2)
Total Protein: 6.2 g/dL — ABNORMAL LOW (ref 6.5–8.1)

## 2018-06-14 LAB — PROCALCITONIN: Procalcitonin: 24.42 ng/mL

## 2018-06-14 MED ORDER — PRO-STAT SUGAR FREE PO LIQD
30.0000 mL | Freq: Every day | ORAL | Status: DC
  Administered 2018-06-15 – 2018-07-12 (×27): 30 mL
  Filled 2018-06-14 (×27): qty 30

## 2018-06-14 MED ORDER — SODIUM CHLORIDE 0.9 % IV SOLN
3.0000 g | Freq: Four times a day (QID) | INTRAVENOUS | Status: AC
  Administered 2018-06-14 – 2018-06-20 (×26): 3 g via INTRAVENOUS
  Filled 2018-06-14 (×26): qty 3

## 2018-06-14 MED ORDER — PRO-STAT SUGAR FREE PO LIQD
30.0000 mL | Freq: Two times a day (BID) | ORAL | Status: DC
  Administered 2018-06-14: 11:00:00 30 mL
  Filled 2018-06-14: qty 30

## 2018-06-14 MED ORDER — PROPOFOL 1000 MG/100ML IV EMUL
5.0000 ug/kg/min | INTRAVENOUS | Status: DC
  Administered 2018-06-14: 5 ug/kg/min via INTRAVENOUS
  Filled 2018-06-14 (×2): qty 100

## 2018-06-14 MED ORDER — MIDAZOLAM HCL 2 MG/2ML IJ SOLN
1.0000 mg | INTRAMUSCULAR | Status: DC | PRN
  Administered 2018-06-18 – 2018-06-23 (×7): 1 mg via INTRAVENOUS
  Filled 2018-06-14 (×8): qty 2

## 2018-06-14 MED ORDER — ACETAMINOPHEN 160 MG/5ML PO SOLN
650.0000 mg | Freq: Four times a day (QID) | ORAL | Status: DC | PRN
  Administered 2018-06-14 – 2018-07-12 (×22): 650 mg
  Filled 2018-06-14 (×21): qty 20.3

## 2018-06-14 MED ORDER — SODIUM CHLORIDE 0.9 % IV SOLN
INTRAVENOUS | Status: DC
  Administered 2018-06-14 – 2018-06-19 (×12): via INTRAVENOUS

## 2018-06-14 MED ORDER — VITAL HIGH PROTEIN PO LIQD: 1000.0000 mL | ORAL | Status: DC

## 2018-06-14 MED ORDER — VITAL 1.5 CAL PO LIQD
1000.0000 mL | ORAL | Status: DC
  Administered 2018-06-15 – 2018-07-01 (×17): 1000 mL
  Filled 2018-06-14 (×31): qty 1000

## 2018-06-14 MED ORDER — MAGNESIUM SULFATE 2 GM/50ML IV SOLN
2.0000 g | Freq: Once | INTRAVENOUS | Status: AC
  Administered 2018-06-14: 2 g via INTRAVENOUS
  Filled 2018-06-14: qty 50

## 2018-06-14 MED ORDER — MAGNESIUM SULFATE 2 GM/50ML IV SOLN: 2.0000 g | Freq: Once | INTRAVENOUS | Status: DC

## 2018-06-14 MED ORDER — FENTANYL CITRATE (PF) 100 MCG/2ML IJ SOLN
50.0000 ug | INTRAMUSCULAR | Status: DC | PRN
  Administered 2018-06-14 – 2018-07-07 (×45): 100 ug via INTRAVENOUS
  Filled 2018-06-14 (×47): qty 2

## 2018-06-14 MED ORDER — VITAL HIGH PROTEIN PO LIQD: 1000.0000 mL | ORAL | Status: AC

## 2018-06-14 MED ORDER — VITAL HIGH PROTEIN PO LIQD
1000.0000 mL | ORAL | Status: DC
  Administered 2018-06-14: 11:00:00 1000 mL

## 2018-06-14 NOTE — Procedures (Signed)
ELECTROENCEPHALOGRAM REPORT   Patient: Jesse [name]       Room #: 5Y09X EEG No. ID: [id number] Age: 28 y.o.        Sex: male Referring Physician: Agarwala Report Date:  06/14/2018        Interpreting Physician: Thana Farr  History: Jesse [name] is an 28 y.o. male found unresponsive in car with seizure-like activity  Medications:  Magnesium sulfate, Diprovan, Protonix  Conditions of Recording:  This is a 21 channel routine scalp EEG performed with bipolar and monopolar montages arranged in accordance to the international 10/20 system of electrode placement. One channel was dedicated to EKG recording.  The patient is in the intubated and sedated state.  Description:  The background activity is dominated by low voltage, beta activity that is continuous and diffusely distributed.  This beta activity is superimposed on an underlying background that consists of a low voltage polymorphic delta rhythm that is continuous and diffusely distributed as well. No epileptiform activity is noted.   Hyperventilation and intermittent photic stimulation were not performed.  IMPRESSION: This is an abnormal electroencephalogram with general background slowing and superimposed beta activity.  This finding is consistent with current medications.  No epileptiform activity is noted.     Thana Farr, MD Neurology [phone] 06/14/2018, 9:39 AM

## 2018-06-14 NOTE — Progress Notes (Signed)
Due to no emergency contacts, family had yet to be updated about his condition and whereabouts. Patient's phone was charged and opened to find next of kin. Mother's phone number was found and spoke to [facility] nurse and physician. I was instructed to notify mother of patient's location and that a physician could contact about patient's medical condition.  Mother was upset but realived of whereabouts, she was wanting to know more of status - referred to physician to answer those questions.   Sherrie George, RN BSN

## 2018-06-14 NOTE — Consult Note (Signed)
Neurology Progress Note   S:// Few episodes of tachycardia overnight with some posturing movements. MRI completed overnight and reviewed by me personally and discussed with neuroradiology. See interpretation below.    O:// Current vital signs: BP (!) 144/93   Pulse (!) 118   Temp 99.5 F (37.5 C) (Axillary)   Resp 20   Ht 6' (1.829 m)   Wt 81 kg   SpO2 97%   BMI 24.22 kg/m  Vital signs in last 24 hours: Temp:  [93.3 F (34.1 C)-99.5 F (37.5 C)] 99.5 F (37.5 C) (04/21 0400) Pulse Rate:  [60-143] 118 (04/21 0700) Resp:  [0-21] 20 (04/21 0700) BP: (62-163)/(36-110) 144/93 (04/21 0700) SpO2:  [95 %-100 %] 97 % (04/21 0700) FiO2 (%):  [50 %-100 %] 50 % (04/21 0341) Weight:  [80 kg-81 kg] 81 kg (04/21 0415) General: Sedated on propofol, intubated HEENT: Normocephalic atraumatic intubated Cardiovascular: Regular rate rhythm, normal sounds Respiratory: Vented, no distress Abdomen: Soft nondistended Neurological exam He does not respond to verbal stimuli or open eyes. Is sedated and intubated Pupils are 3 mm equal round reactive to light. Corneal reflexes are present Breathing over the ventilator Cough and gag are present Cranial nerves: Pupils 3 mm round reactive light, no forced gaze, difficult ascertain facial symmetry, does not blink to threat from either side. Motor exam: No spontaneous movement noted during this encounter.  On noxious stimulation has quick flexion followed by extension of the upper extremities and triple flexion in the lower extremities. Sensory exam: As above Coordination cannot be assessed. Medications  Current Facility-Administered Medications:  .  0.9 %  sodium chloride infusion, 250 mL, Intravenous, Continuous, Bowser, Grace E, NP .  albuterol (PROVENTIL) (2.5 MG/3ML) 0.083% nebulizer solution 2.5 mg, 2.5 mg, Nebulization, Q2H PRN, Bowser, Laurel Dimmer, NP .  bisacodyl (DULCOLAX) suppository 10 mg, 10 mg, Rectal, Daily PRN, Bowser, Laurel Dimmer, NP .   chlorhexidine gluconate (MEDLINE KIT) (PERIDEX) 0.12 % solution 15 mL, 15 mL, Mouth Rinse, BID, Agarwala, Ravi, MD, 15 mL at 06/13/18 2219 .  docusate (COLACE) 50 MG/5ML liquid 100 mg, 100 mg, Per Tube, BID PRN, Bowser, Laurel Dimmer, NP .  fentaNYL (SUBLIMAZE) injection 50 mcg, 50 mcg, Intravenous, Q15 min PRN, Bowser, Laurel Dimmer, NP, 50 mcg at 06/13/18 2008 .  fentaNYL (SUBLIMAZE) injection 50-200 mcg, 50-200 mcg, Intravenous, Q30 min PRN, Bowser, Grace E, NP .  MEDLINE mouth rinse, 15 mL, Mouth Rinse, 10 times per day, Kipp Brood, MD, 15 mL at 06/14/18 0536 .  midazolam (VERSED) injection 2 mg, 2 mg, Intravenous, Q15 min PRN, Bowser, Grace E, NP .  midazolam (VERSED) injection 2 mg, 2 mg, Intravenous, Q2H PRN, Bowser, Grace E, NP, 2 mg at 06/14/18 0245 .  norepinephrine (LEVOPHED) '4mg'$  in 274m premix infusion, 2-10 mcg/min, Intravenous, Titrated, Bowser, GLaurel Dimmer NP, Stopped at 06/14/18 0400 .  ondansetron (ZOFRAN) injection 4 mg, 4 mg, Intravenous, Q6H PRN, Bowser, Grace E, NP .  pantoprazole sodium (PROTONIX) 40 mg/20 mL oral suspension 40 mg, 40 mg, Per Tube, Daily, Agarwala, Ravi, MD, 40 mg at 06/13/18 2219 .  propofol (DIPRIVAN) 1000 MG/100ML infusion, 5-80 mcg/kg/min, Intravenous, Titrated, Aventura, Emily T, MD, Last Rate: 2.4 mL/hr at 06/14/18 0700, 5 mcg/kg/min at 06/14/18 0700 Labs CBC    Component Value Date/Time   WBC 16.8 (H) 06/14/2018 0408   RBC 4.76 06/14/2018 0408   HGB 14.8 06/14/2018 0408   HCT 42.1 06/14/2018 0408   PLT 226 06/14/2018 0408   MCV 88.4 06/14/2018  0408   MCH 31.1 06/14/2018 0408   MCHC 35.2 06/14/2018 0408   RDW 12.4 06/14/2018 0408   LYMPHSABS 0.9 06/13/2018 1609   MONOABS 3.9 (H) 06/13/2018 1609   EOSABS 0.0 06/13/2018 1609   BASOSABS 0.0 06/13/2018 1609    CMP     Component Value Date/Time   NA 141 06/14/2018 0408   K 4.0 06/14/2018 0408   CL 107 06/14/2018 0408   CO2 24 06/14/2018 0408   GLUCOSE 100 (H) 06/14/2018 0408   BUN 17 06/14/2018 0408    CREATININE 1.09 06/14/2018 0408   CALCIUM 9.0 06/14/2018 0408   PROT 6.2 (L) 06/14/2018 0408   ALBUMIN 3.6 06/14/2018 0408   AST 1,516 (H) 06/14/2018 0408   ALT 1,382 (H) 06/14/2018 0408   ALKPHOS 63 06/14/2018 0408   BILITOT 0.8 06/14/2018 0408   GFRNONAA >60 06/14/2018 0408   GFRAA >60 06/14/2018 0408   Carboxyhemoglobin hemoglobin are elevated above normal range.  Imaging I have reviewed images in epic and the results pertinent to this consultation are: CT scan of the brain with hypodensity in the lentiform nuclei bilaterally. MRI of the brain shows multiple areas of restricted diffusion, predominantly in the cerebellar hemispheres which might be representative of hypoxic/anoxic injury but specifically in bilateral globus pallidus and lentiform nuclei on the right indicating possible carbon monoxide poisoning as etiology.  There are also other small areas of restricted diffusion in a pattern that appears possibly watershed looking as well.  Unifying Link Snuffer is it is a combination of car monoxide poisoning, hypoxia/anoxia as well as hypotension leading to some watershed damage.  There is also some petechial hemorrhage on the susceptibility weighted imaging in the deep gray matter nuclei which could be related to either car monoxide or hypoxia. Intracranial MRA was negative for any acute occlusion.        Assessment: 28 year old man with no past medical history, found in a parking lot in his car unresponsive with his car running in [facility] running with unknown downtime with no reported cardiac arrest, with reported upward gaze and some generalized body twitching.  Neurological consultation obtained for concern for seizure. Lab findings were concerning for hypoxia and acidosis on arrival. Urinary toxicology screen later came positive for cocaine and amphetamines. Imaging finding in the form of CT done on presentation were concerning for bilateral basal ganglia hypodensities raising concern  for car monoxide poisoning.  MRI of the brain followed, officially read as hypoxic anoxic injury but I had a detailed discussion with the neuroradiologist over the phone who agrees that there might be a component of a mixture of hypoxic/anoxic damage, watershed damage as well as current monoxide poisoning due to selective involvement of the bilateral globus pallidus. His exam is essentially unchanged from yesterday. I would imagine that the changes related to current monoxide poisoning, with supportive care would show improvement over time. Given the fact that there was more than just car monoxide poisoning might complicate his clinical picture. He remains in critical condition at this time with a guarded prognosis.  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  Recommendations: He was loaded with Keppra in the emergency room.  No need to continue antiepileptics. EEG was negative for any seizure-like activity. Supportive care and management per primary team. We will continue to follow him along clinically with you. I do not see any indication for any further imaging at this time.  --  Amie Portland, MD [facility] Pager: [phone] If 7pm to 7am, please call on call as listed on AMION.  CRITICAL CARE ATTESTATION Performed by: Amie Portland, MD Total critical care time: 35 minutes Critical care time was exclusive of separately billable procedures and treating other patients and/or supervising APPs/Residents/Students Critical care was necessary to treat or prevent imminent or life-threatening deterioration due to toxic metabolic encephalopathy, hypoxic anoxic brain injury, carbon monoxide poisoning and drug overdose. This patient is critically ill and at significant risk for neurological worsening and/or death and care requires constant monitoring.  Critical care was time spent personally by me on the following activities: development of treatment plan with patient and/or surrogate as well as nursing, discussions with consultants, evaluation of patient's response to treatment, examination of patient, obtaining history from patient or surrogate, ordering and performing treatments and interventions, ordering and review of laboratory studies, ordering and review of radiographic studies, pulse oximetry, re-evaluation of patient's condition, participation in multidisciplinary rounds and medical decision making of high complexity in the care of this patient.

## 2018-06-14 NOTE — Progress Notes (Signed)
  Echocardiogram 2D Echocardiogram has been performed.  Jesse [name] 06/14/2018, 11:22 AM

## 2018-06-14 NOTE — Progress Notes (Signed)
Assisted tele visit to patient with family member.  Mordechai Matuszak P, RN  

## 2018-06-14 NOTE — Progress Notes (Signed)
Initial Nutrition Assessment  DOCUMENTATION CODES:   Not applicable  INTERVENTION:   D/C Vital High Protein after liter is finished  Initiate Vital 1.5 @ 65 ml/hr via OG tube 30 ml Prostat daily  Provides: 2440 kcal, 120 grams protein, and 1191 ml free water.    NUTRITION DIAGNOSIS:   Inadequate oral intake related to inability to eat as evidenced by NPO status.  GOAL:   Patient will meet greater than or equal to 90% of their needs  MONITOR:   Vent status, TF tolerance  REASON FOR ASSESSMENT:   Consult, Ventilator Enteral/tube feeding initiation and management  ASSESSMENT:   Pt with PMH of substance abuse and prior suicide attempt admitted 4/20 with suspected drug overdose (UDS positive on admission for amphetamines, cocaine, and benzos), suspected RLL aspiration PNA, and AKI after being found down in his car outside of a hotel.    Patient is currently intubated on ventilator support MV: 17.9 L/min Temp (24hrs), Avg:97 F (36.1 C), Min:93.3 F (34.1 C), Max:101.4 F (38.6 C)   Medications reviewed and include: 2 g mag sulfate x 1 Labs reviewed    NUTRITION - FOCUSED PHYSICAL EXAM:  Deferred   Diet Order:   Diet Order            Diet NPO time specified  Diet effective now              EDUCATION NEEDS:   No education needs have been identified at this time  Skin:  Skin Assessment: Reviewed RN Assessment  Last BM:  unknown  Height:   Ht Readings from Last 1 Encounters:  06/13/18 6' (1.829 m)    Weight:   Wt Readings from Last 1 Encounters:  06/14/18 81 kg    Ideal Body Weight:  80.9 kg  BMI:  Body mass index is 24.22 kg/m.  Estimated Nutritional Needs:   Kcal:  2550  Protein:  110-120 grams  Fluid:  > 2 L/day  Kendell Bane RD, LDN, CNSC [phone] Pager [phone] After Hours Pager

## 2018-06-14 NOTE — Progress Notes (Signed)
Called Dr. Violet Baldy d/t increased heart rate and increased posturing - HR in 130-140's. Attempted Versed without success.  New orders for propofol. Will continue to monitor.   Sherrie George, RN BSN

## 2018-06-14 NOTE — Progress Notes (Signed)
eLink Physician-Brief Progress Note Patient Name: Jesse [name] DOB: [birth year] MRN: [id number]   Date of Service  06/14/2018  HPI/Events of Note  Patient noted to have more myoclonus and tachycardia on stimulation. Versed pushes given. Family now aware of patient's location.  eICU Interventions  Will start propofol drip to avoid giving longer acting sedatives so as to be able to assess neurologic status for prognostication.     Intervention Category Minor Interventions: Agitation / anxiety - evaluation and management  Darl Pikes 06/14/2018, 4:01 AM

## 2018-06-14 NOTE — Progress Notes (Signed)
Pharmacy Antibiotic Note  Jesse [name] is a 28 y.o. male admitted on 06/13/2018 after found down in parked car.  Pharmacy has been consulted for Unasyn dosing for aspiration pneumonia coverage.  Cultures sent, temp to 101.4.  Plan:  Unasyn 3 gm IV q6hrs.  Follow culture data, clinical progress.  Height: 6' (182.9 cm) Weight: 178 lb 9.2 oz (81 kg) IBW/kg (Calculated) : 77.6  Temp (24hrs), Avg:96.7 F (35.9 C), Min:93.3 F (34.1 C), Max:101.4 F (38.6 C)  Recent Labs  Lab 06/13/18 1609 06/13/18 2148 06/14/18 0408  WBC 29.9*  --  16.8*  CREATININE 1.81*  --  1.09  LATICACIDVEN 7.2* 4.2*  --     Estimated Creatinine Clearance: 113.7 mL/min (by C-G formula based on SCr of 1.09 mg/dL).    No Known Allergies  Antimicrobials this admission:  Unasyn 4/21>>  Dose adjustments this admission:  n/a  Microbiology results: 4/20 COVID - negative 4/20 MRSA PCR - negative 4/20 blood x 2 - 4/20 urine - 4/20 trach aspirate -  Thank you for allowing pharmacy to be a part of this patient's care.  Dennie Fetters, [place] Pager: [phone] or phone: [phone] 06/14/2018 10:15 AM

## 2018-06-14 NOTE — Progress Notes (Addendum)
NAME:  Jesse [name], MRN:  [id number], DOB:  06/17/1990, LOS: 1 ADMISSION DATE:  06/13/2018, CONSULTATION DATE: June 13, 2018 REFERRING MD: Dr. Pilar PlateBero EDP, CHIEF COMPLAINT: Altered mental status  Brief History   28 year old male found down in car with suspected drug overdose versus status epilepticus.  Intubated in the emergency department.  CT Head with concern by Neurology for bilateral basal ganglia hypodensities. Admitted to ICU on vent.    Past Medical History  Substance abuse Prior suicide attempt  Significant Hospital Events   4/20 Admit with AMS, intubated.   Consults:  Neurology 4/20 >  Procedures:  ETT 4/20 >  Significant Diagnostic Tests:  CT head 4/20 > official read as non-acute. Neurology feels there are bilateral basal ganglia hypodensities.   Micro Data:  Blood 4/20 > Trach aspirate 4/20 > HIV 4/20 >  Antimicrobials:  Unasyn 4/21 >>  Interim history/subjective:  RN reports parents notified of admit overnight.  Pt febrile to 101.4, tachycardic.  Propofol started for tachycardia per RN overnight. I/O - UOP 1.2L in last 24 hours / 2.7L positive for last 24.    Mother reports car was found at [employer] parking lot, found by an employee of the hotel.  They think car had been there most of the day.  Car was running when found.  [place] before she woke.  Car was found around 2 pm.  PMH of addiction / substance abuse > [age] went through active addiction phase, has attempted suicide during that time.  [facility] and was clean since then > few episodes of ETOH but no drug abuse.  Was in good spirits this weekend with Mom > she had no indication that he was in a bad place.  She thinks something happened Sunday night.  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  Recently on Vyvanse, med for RLS.   Objective    Blood pressure 138/88, pulse (!) 105, temperature (!) 101.4 F (38.6 C), temperature source Axillary, resp. rate 16, height 6' (1.829 m), weight 81 kg, SpO2 100 %.    Vent Mode: PRVC FiO2 (%):  [40 %-100 %] 40 % Set Rate:  [16 bmp] 16 bmp Vt Set:  [620 mL] 620 mL PEEP:  [5 cmH20] 5 cmH20 Plateau Pressure:  [13 cmH20-17 cmH20] 14 cmH20   Intake/Output Summary (Last 24 hours) at 06/14/2018 0849 Last data filed at 06/14/2018 0800 Gross per 24 hour  Intake 3974.34 ml  Output 1450 ml  Net 2524.34 ml   Filed Weights   06/13/18 1844 06/14/18 0415  Weight: 80 kg 81 kg    Examination: General: young adult male lying in bed on vent, critically ill appearing  HEENT: MM pink/moist, ETT, thick drainage from nares Neuro: sedate on vent, pupils 3mm sluggish  CV: s1s2 rrr, no m/r/g PULM: even/non-labored, lungs bilaterally coarse WU:JWJXGI:soft, non-tender, bsx4 active  Extremities: warm/dry, no edema  Skin: no rashes or lesions  Resolved Hospital Problem list   Circulatory shock   Assessment & Plan:   Acute Encephalopathy -DDx includes metabolic derangement, hypoxic or toxic ingestion. He was loaded with Keppra, but EEG done in ED and read by Neurohospitalist felt to not represent seizure, but diffuse slowing. UDS on admit positive for amphetamines, cocaine, and benzos (versed given in field) P: Supportive care  Minimize sedation as  able  Neurology following Follow frequent neuro exam   Acute Respiratory Insufficiency  -in setting of AMS P: PRVC 8cc/kg  Wean PEEP / fiO2 for sats >90% VAP prevention measures  SBT/WUA daily  Follow intermittent CXR   RLL Airspace Disease / Suspected Aspiration  -in setting of AMS P: Add unasyn  Follow RLL on CXR  SIRS -leukocytosis and hypothermia on admit, suspected RLL aspiration PNA  P: ABX as above  Follow cultures  Trend PCT   AKI -secondary to hypovolemia and rhabdomyolysis (CK 511) Rhabdomyolysis  Hyperkalemia P: Increase IVF  to 179ml/hr  Trend CK  Trend BMP / urinary output Replace electrolytes as indicated Avoid nephrotoxic agents, ensure adequate renal perfusion  Shock Liver  P: Trend LFT's   At Risk Malnutrition  P: Begin TF per Nutrition  Best practice:  Diet: NPO, begin TF Pain/Anxiety/Delirium protocol (if indicated): PRN sedation VAP protocol (if indicated): Per protocol DVT prophylaxis: SCDs GI prophylaxis: Protonix Glucose control: N/a Mobility: BR Code Status: Full Family Communication:   Disposition: ICU  Labs   CBC: Recent Labs  Lab 06/13/18 1609 06/13/18 1811 06/14/18 0408  WBC 29.9*  --  16.8*  NEUTROABS 24.2*  --   --   HGB 14.3 11.2* 14.8  HCT 43.9 33.0* 42.1  MCV 94.4  --  88.4  PLT 264  --  226    Basic Metabolic Panel: Recent Labs  Lab 06/13/18 1609 06/13/18 1811 06/13/18 2148 06/14/18 0408  NA 142 142  --  141  K 5.5* 4.5  --  4.0  CL 106  --   --  107  CO2 22  --   --  24  GLUCOSE 124*  --   --  100*  BUN 18  --   --  17  CREATININE 1.81*  --   --  1.09  CALCIUM 9.2  --   --  9.0  MG  --   --  1.8 1.5*  PHOS  --   --  4.8* 3.5   GFR: Estimated Creatinine Clearance: 113.7 mL/min (by C-G formula based on SCr of 1.09 mg/dL). Recent Labs  Lab 06/13/18 1609 06/13/18 2148 06/14/18 0408  PROCALCITON  --  21.28 24.42  WBC 29.9*  --  16.8*  LATICACIDVEN 7.2* 4.2*  --     Liver Function Tests: Recent Labs  Lab 06/13/18 1609 06/14/18 0408  AST 940* 1,516*  ALT 1,201* 1,382*  ALKPHOS 66 63  BILITOT 0.4 0.8  PROT 7.0 6.2*  ALBUMIN 4.0 3.6   No results for input(s): LIPASE, AMYLASE in the last 168 hours. No results for input(s): AMMONIA in the last 168 hours.  ABG    Component Value Date/Time   PHART 7.352 06/13/2018 1811   PCO2ART 39.7 06/13/2018 1811   PO2ART 261.0 (H) 06/13/2018 1811   HCO3 22.0 06/13/2018 1811   TCO2 23 06/13/2018 1811   ACIDBASEDEF 3.0 (H) 06/13/2018 1811   O2SAT 100.0 06/13/2018 1811     Coagulation Profile:  Recent Labs  Lab 06/13/18 1609  INR 1.2    Cardiac Enzymes: Recent Labs  Lab 06/13/18 1609 06/14/18 0408  CKTOTAL 511* 13,371*    HbA1C: No results found for: HGBA1C  CBG: No results for input(s): GLUCAP in the last 168 hours.   Critical care time: 35 minutes    Canary Brim, NP-C [facility] Pgr: [phone] or if no answer [phone] 06/14/2018, 8:49 AM

## 2018-06-15 ENCOUNTER — Inpatient Hospital Stay (HOSPITAL_COMMUNITY): Payer: BLUE CROSS/BLUE SHIELD

## 2018-06-15 DIAGNOSIS — R4182 Altered mental status, unspecified: Secondary | ICD-10-CM

## 2018-06-15 DIAGNOSIS — R569 Unspecified convulsions: Secondary | ICD-10-CM

## 2018-06-15 LAB — CBC
HCT: 41.8 % (ref 39.0–52.0)
Hemoglobin: 14.6 g/dL (ref 13.0–17.0)
MCH: 31.3 pg (ref 26.0–34.0)
MCHC: 34.9 g/dL (ref 30.0–36.0)
MCV: 89.7 fL (ref 80.0–100.0)
Platelets: 163 10*3/uL (ref 150–400)
RBC: 4.66 MIL/uL (ref 4.22–5.81)
RDW: 12.7 % (ref 11.5–15.5)
WBC: 21.1 10*3/uL — ABNORMAL HIGH (ref 4.0–10.5)
nRBC: 0 % (ref 0.0–0.2)

## 2018-06-15 LAB — CK
Total CK: 7844 U/L — ABNORMAL HIGH (ref 49–397)
Total CK: 5159 U/L — ABNORMAL HIGH (ref 49–397)

## 2018-06-15 LAB — PROCALCITONIN: Procalcitonin: 11.91 ng/mL

## 2018-06-15 LAB — GLUCOSE, CAPILLARY
Glucose-Capillary: 130 mg/dL — ABNORMAL HIGH (ref 70–99)
Glucose-Capillary: 116 mg/dL — ABNORMAL HIGH (ref 70–99)
Glucose-Capillary: 100 mg/dL — ABNORMAL HIGH (ref 70–99)
Glucose-Capillary: 112 mg/dL — ABNORMAL HIGH (ref 70–99)
Glucose-Capillary: 148 mg/dL — ABNORMAL HIGH (ref 70–99)
Glucose-Capillary: 68 mg/dL — ABNORMAL LOW (ref 70–99)

## 2018-06-15 LAB — COMPREHENSIVE METABOLIC PANEL WITH GFR
ALT: 940 U/L — ABNORMAL HIGH (ref 0–44)
AST: 569 U/L — ABNORMAL HIGH (ref 15–41)
Albumin: 2.8 g/dL — ABNORMAL LOW (ref 3.5–5.0)
Alkaline Phosphatase: 59 U/L (ref 38–126)
Anion gap: 10 (ref 5–15)
BUN: 15 mg/dL (ref 6–20)
CO2: 25 mmol/L (ref 22–32)
Calcium: 8.6 mg/dL — ABNORMAL LOW (ref 8.9–10.3)
Chloride: 105 mmol/L (ref 98–111)
Creatinine, Ser: 0.86 mg/dL (ref 0.61–1.24)
GFR calc Af Amer: >60 mL/min
GFR calc non Af Amer: >60 mL/min
Glucose, Bld: 119 mg/dL — ABNORMAL HIGH (ref 70–99)
Potassium: 3.6 mmol/L (ref 3.5–5.1)
Sodium: 140 mmol/L (ref 135–145)
Total Bilirubin: 0.9 mg/dL (ref 0.3–1.2)
Total Protein: 5.7 g/dL — ABNORMAL LOW (ref 6.5–8.1)

## 2018-06-15 LAB — URINE CULTURE: Culture: NO GROWTH

## 2018-06-15 LAB — CARBON MONOXIDE, BLOOD (PERFORMED AT REF LAB): Carbon Monoxide, Blood: 4.6 % — ABNORMAL HIGH (ref 0.0–3.6)

## 2018-06-15 MED ORDER — LEVETIRACETAM 500 MG PO TABS: 500.0000 mg | ORAL_TABLET | Freq: Two times a day (BID) | ORAL | Status: DC

## 2018-06-15 MED ORDER — DEXTROSE 50 % IV SOLN
12.5000 g | Freq: Once | INTRAVENOUS | Status: AC
  Administered 2018-06-15: 12.5 g via INTRAVENOUS
  Filled 2018-06-15: qty 50

## 2018-06-15 MED ORDER — VITAL HIGH PROTEIN PO LIQD: 1000.0000 mL | ORAL | Status: DC

## 2018-06-15 MED ORDER — SODIUM CHLORIDE 0.9 % IV SOLN
1500.0000 mg | Freq: Once | INTRAVENOUS | Status: AC
  Administered 2018-06-15: 1500 mg via INTRAVENOUS
  Filled 2018-06-15: qty 30

## 2018-06-15 MED ORDER — LORAZEPAM 2 MG/ML IJ SOLN
INTRAMUSCULAR | Status: AC
  Filled 2018-06-15: qty 1

## 2018-06-15 MED ORDER — LORAZEPAM 2 MG/ML IJ SOLN
INTRAMUSCULAR | Status: AC
  Administered 2018-06-15: 4 mg
  Filled 2018-06-15: qty 1

## 2018-06-15 MED ORDER — LEVETIRACETAM IN NACL 500 MG/100ML IV SOLN
500.0000 mg | Freq: Two times a day (BID) | INTRAVENOUS | Status: DC
  Administered 2018-06-15 – 2018-06-20 (×11): 500 mg via INTRAVENOUS
  Filled 2018-06-15 (×11): qty 100

## 2018-06-15 MED ORDER — PROPOFOL 1000 MG/100ML IV EMUL
0.0000 ug/kg/min | INTRAVENOUS | Status: DC
  Administered 2018-06-15 (×2): 20 ug/kg/min via INTRAVENOUS
  Administered 2018-06-16: 16:00:00 15 ug/kg/min via INTRAVENOUS
  Administered 2018-06-16: 03:00:00 20 ug/kg/min via INTRAVENOUS
  Administered 2018-06-17: 04:00:00 15 ug/kg/min via INTRAVENOUS
  Filled 2018-06-15 (×5): qty 100

## 2018-06-15 MED ORDER — PROPOFOL BOLUS VIA INFUSION
1.0000 mg/kg | Freq: Once | INTRAVENOUS | Status: AC
  Administered 2018-06-15: 89.8 mg via INTRAVENOUS
  Filled 2018-06-15: qty 90

## 2018-06-15 NOTE — Evaluation (Signed)
Physical Therapy Evaluation Patient Details Name: Jesse [name] MRN: [id number] DOB: [birth year] Today's Date: 06/15/2018   History of Present Illness  28 year old male found down in car with suspected drug overdose versus status epilepticus. Intubated 4/20. MRI showing multiple areas of restricted diffusion, predominantly in the cerebellar hemispheres which might be representative of hypoxic/anoxic injury but specifically in bilateral globus pallidus and lentiform nuclei on the right. No significant medical past history but history of alcohol and drug abuse.   Clinical Impression  Orders received for PT evaluation. PT evaluated patient for ROM, coma stim, and positioning. Patient tolerated ROM session well with VSS. Then assisted nsg with bathing and positioning of patient, when concluding bathing, patient with urinary incontinence and breath stacking and bite reflex followed by subsequent desaturation with rapid decline from 100% to 30s%. Nsg emergently to room to assist. HR elevation to 120s.     Follow Up Recommendations (tbd)    Equipment Recommendations  (tbd)    Recommendations for Other Services (tbd)     Precautions / Restrictions Precautions Precautions: Fall;Other (comment)(Decreased arousal)      Mobility  Bed Mobility               General bed mobility comments: Defered  Transfers                 General transfer comment: Defered             Pertinent Vitals/Pain Pain Assessment: Faces Faces Pain Scale: No hurt Pain Intervention(s): Monitored during session    Home Living Family/patient expects to be discharged to:: Private residence Living Arrangements: Spouse/significant other Available Help at Discharge: Family Type of Home: House Home Access: Stairs to enter   Secretary/administrator of Steps: 2-3 Home Layout: One level Home Equipment: None Additional Comments: Called mom to collect home information    Prior Function Level of  Independence: Independent         Comments: RN reporting that pt was independent, 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      Hand Dominance   Dominant Hand: Left(eats and writes  with L; plays hockey with R)    Extremity/Trunk Assessment   Upper Extremity Assessment Upper Extremity Assessment: Defer to OT evaluation RUE Deficits / Details: Active movement to flex fingers into fist. No active extenion of fingers. No AROM for wrist, elbow, or shoulder. WFL for PROM RUE Coordination: decreased fine motor;decreased gross motor LUE Deficits / Details: Active movement to flex fingers into fist. No active extenion of fingers. No AROM for wrist, elbow, or shoulder. WFL for PROM LUE Coordination: decreased fine motor;decreased gross motor    Lower Extremity Assessment Lower Extremity Assessment: RLE deficits/detail;LLE deficits/detail RLE Deficits / Details: withdrawl to pain, Active movement/posturing noted with noxious stimuli. PROM WFL but noted increased tone bilaterally LLE Deficits / Details: withdrawl to pain, Active movement/posturing noted with noxious stimuli. PROM WFL but noted increased tone bilaterally       Communication   Communication: Other (comment)(Intubated)  Cognition Arousal/Alertness: Lethargic Behavior During Therapy: Flat affect Overall Cognitive Status: Difficult to assess       General Comments: Withdrawls to pain all extremities, Eyes open x2 during session but with eyes partially rolled back, no specific gaze indication noted. Posturing on ocassion      General Comments General comments (skin integrity, edema, etc.): VSS during ROM, subsequently PT assisting nsg with hygiene and pericare/patient with urination/teeth clenching, and desaturation (emergent response by nursing)  Exercises General Exercises - Upper Extremity Shoulder Flexion: PROM;Both;10 reps;Supine Shoulder Extension: PROM;Both;10 reps;Supine Elbow  Flexion: PROM;Both;10 reps;Supine Elbow Extension: PROM;Both;10 reps;Supine Wrist Flexion: PROM;Both;10 reps;Supine Wrist Extension: PROM;Both;10 reps;Supine Digit Composite Flexion: (Automatic flexion of fingers) Composite Extension: PROM;Both;10 reps;Supine Other Exercises Other Exercises: PROM bilateral LEs Other Exercises: Heel cord stretch Bilateral LE x5 10 second holds Other Exercises: Knee flexion (with tone inhibition) bilaterally x5 Other Exercises: Hip flexion/knee flexion/IR-ER x5 with tone management   Assessment/Plan    PT Assessment Patient needs continued PT services  PT Problem List Decreased activity tolerance;Decreased mobility;Cardiopulmonary status limiting activity       PT Treatment Interventions Therapeutic activities;Functional mobility training;Therapeutic exercise;Balance training;DME instruction;Neuromuscular re-education;Cognitive remediation;Patient/family education;Modalities;Manual techniques    PT Goals (Current goals can be found in the Care Plan section)  Acute Rehab PT Goals Patient Stated Goal: Unstated    Frequency Min 2X/week(trial PT for ROM/Coma Stim and positioning)   Barriers to discharge           AM-PAC PT "6 Clicks" Mobility  Outcome Measure Help needed turning from your back to your side while in a flat bed without using bedrails?: Total Help needed moving from lying on your back to sitting on the side of a flat bed without using bedrails?: Total Help needed moving to and from a bed to a chair (including a wheelchair)?: Total Help needed standing up from a chair using your arms (e.g., wheelchair or bedside chair)?: Total Help needed to walk in hospital room?: Total Help needed climbing 3-5 steps with a railing? : Total 6 Click Score: 6    End of Session         PT Visit Diagnosis: Other symptoms and signs involving the nervous system (R29.898)    Time: 1610-9604: 1104-1128 PT Time Calculation (min) (ACUTE ONLY): 24  min   Charges:   PT Evaluation $PT Eval High Complexity: 1 High          Charlotte Crumbevon Oney Folz, PT DPT  Board Certified Neurologic Specialist Acute Rehabilitation Services Pager [phone] Office [phone]   Fabio AsaDevon J Ridge Lafond 06/15/2018, 11:50 AM

## 2018-06-15 NOTE — Progress Notes (Signed)
Per RN, pt desat to 33% during seizure episode, and pt was reported to be biting on ETT, an oral airway was placed by RN &  pt was placed on 100% fio2 during event.  Sat slowly came up to 97%.  Sat now between 90-95% on 70% fio2.  RN admin meds.  MD was at bedside during event.

## 2018-06-15 NOTE — Progress Notes (Signed)
STAT EEG completed, results pending, Dr Wilford Corner notified.

## 2018-06-15 NOTE — Evaluation (Signed)
Occupational Therapy Evaluation Patient Details Name: Jesse [name] MRN: [id number] DOB: 1990/09/24 Today's Date: 06/15/2018    History of Present Illness 28 year old male found down in car with suspected drug overdose versus status epilepticus. Intubated 4/20. MRI showing multiple areas of restricted diffusion, predominantly in the cerebellar hemispheres which might be representative of hypoxic/anoxic injury but specifically in bilateral globus pallidus and lentiform nuclei on the right. No significant medical past history but history of alcohol and drug abuse.      Clinical Impression   Upon arrival, pt supine in bed with decreased arousal. Pt currently requiring Total A for ADLs. WFL for PROM of BUEs. Noting pt with active flexion of digits (bilaterally) and preference for fisted position of hands. Discussed with RN use of resting hand splints for during day as well as PROM at bed level. 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 Will continue to follow acutely to monitor splint and facilitate increased occupational performance and participation. Pending pt progress, recommend post-acute rehab to optimize return to PLOF and decrease caregiver burden.     Follow Up Recommendations  Other (comment)(Pending pt progress)    Equipment Recommendations  Other (comment)(Defer to next venue)    Recommendations for Other Services PT consult     Precautions / Restrictions Precautions Precautions: Fall;Other (comment)(Decreased arousal)      Mobility Bed Mobility               General bed mobility comments: Defered  Transfers                 General transfer comment: Defered    Balance                                           ADL either performed or assessed with clinical judgement   ADL Overall ADL's : Needs assistance/impaired                                        General ADL Comments: Total A for ADLs. Educating RN on splint wear schedule and ROM.      Vision         Perception     Praxis      Pertinent Vitals/Pain Pain Assessment: Faces Faces Pain Scale: No hurt Pain Intervention(s): Monitored during session     Hand Dominance Left(eats and writes  with L; plays hockey with R)   Extremity/Trunk Assessment Upper Extremity Assessment Upper Extremity Assessment: RUE deficits/detail;LUE deficits/detail RUE Deficits / Details: Active movement to flex fingers into fist. No active extenion of fingers. No AROM for wrist, elbow, or shoulder. WFL for PROM RUE Coordination: decreased fine motor;decreased gross motor LUE Deficits / Details: Active movement to flex fingers into fist. No active extenion of fingers. No AROM for wrist, elbow, or shoulder. WFL for PROM LUE Coordination: decreased fine motor;decreased gross motor   Lower Extremity Assessment Lower Extremity Assessment: Defer to PT evaluation       Communication Communication Communication: Other (comment)(Intubated)   Cognition Arousal/Alertness: Lethargic Behavior During Therapy: Flat affect(Keeping eyes closed) Overall Cognitive Status: Difficult to assess  General Comments: Pt keeping his eye closed. Only responding to turning of neck for painful stimuli   General Comments  VSS during ROM    Exercises Exercises: General Upper Extremity General Exercises - Upper Extremity Shoulder Flexion: PROM;Both;10 reps;Supine Shoulder Extension: PROM;Both;10 reps;Supine Elbow Flexion: PROM;Both;10 reps;Supine Elbow Extension: PROM;Both;10 reps;Supine Wrist Flexion: PROM;Both;10 reps;Supine Wrist Extension: PROM;Both;10 reps;Supine Digit Composite Flexion: AROM;Both;10 reps;Supine(Automatic flexion of fingers) Composite Extension: PROM;Both;10 reps;Supine   Shoulder Instructions      Home Living Family/patient expects  to be discharged to:: Private residence Living Arrangements: Spouse/significant other Available Help at Discharge: Family Type of Home: House Home Access: Stairs to enter Secretary/administratorntrance Stairs-Number of Steps: 2-3   Home Layout: One level     Bathroom Shower/Tub: Chief Strategy OfficerTub/shower unit   Bathroom Toilet: Standard     Home Equipment: None   Additional Comments: Called mom to collect home information      Prior Functioning/Environment Level of Independence: Independent        Comments: RN reporting that pt was independent, 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         OT Problem List: Decreased strength;Decreased range of motion;Decreased activity tolerance;Impaired balance (sitting and/or standing);Decreased knowledge of use of DME or AE;Decreased knowledge of precautions;Impaired UE functional use      OT Treatment/Interventions: Self-care/ADL training;Therapeutic exercise;Energy conservation;DME and/or AE instruction;Splinting;Therapeutic activities;Cognitive remediation/compensation;Patient/family education    OT Goals(Current goals can be found in the care plan section) Acute Rehab OT Goals Patient Stated Goal: Unstated OT Goal Formulation: Patient unable to participate in goal setting Time For Goal Achievement: 06/29/18 Potential to Achieve Goals: Good  OT Frequency: Min 3X/week   Barriers to D/C:            Co-evaluation              AM-PAC OT "6 Clicks" Daily Activity     Outcome Measure Help from another person eating meals?: Total Help from another person taking care of personal grooming?: Total Help from another person toileting, which includes using toliet, bedpan, or urinal?: Total Help from another person bathing (including washing, rinsing, drying)?: Total Help from another person to put on and taking off regular upper body clothing?: Total Help from another person to put on and taking off regular lower body clothing?:  Total 6 Click Score: 6   End of Session Nurse Communication: Mobility status;Other (comment)(Splint wear and ROM)  Activity Tolerance: Patient limited by fatigue;Patient limited by lethargy Patient left: in bed;with call bell/phone within reach  OT Visit Diagnosis: Muscle weakness (generalized) (M62.81);Other symptoms and signs involving cognitive function                Time: [id number] OT Time Calculation (min): 9 min Charges:  OT General Charges $OT Visit: 1 Visit OT Evaluation $OT Eval High Complexity: 1 High  Valyncia Wiens MSOT, OTR/L Acute Rehab Pager: [phone] Office: [phone]  Theodoro GristCharis M Amaree Leeper 06/15/2018, 9:41 AM

## 2018-06-15 NOTE — Progress Notes (Signed)
RN notified me of possible neuro storm currently.  Sat 90%, HR 130s.  Increased fio2 to 100%.

## 2018-06-15 NOTE — Progress Notes (Signed)
OT NOTE  RN STAFF  Please check bilateral resting hand splint every 4 hours during day shift (remove splint, remove stockinette/ dressing present) to assess for: * pain * redness *swelling  If any symptoms above present remove splint for 15 minutes. If symptoms continue - keep the splint removed and notify OT staff [phone] immediately.   Keep the UE elevated at all times on pillows / towels.  Splint can be cleaned with warm soapy water and alcohol swab. Splint should not be placed in heat of any kind because the splint with mold into a new shape.

## 2018-06-15 NOTE — Procedures (Signed)
ELECTROENCEPHALOGRAM REPORT   Patient: Jesse [name]       Room #: 6O03O EEG No. ID: [id number] Age: 28 y.o.        Sex: male Referring Physician: Wynona Neat Report Date:  06/15/2018        Interpreting Physician: Thana Farr  History: Duel Guderian is an 28 y.o. male found unresponsive in car with seizure-like activity  Medications:  Diprovan, Unasyn, Protonix  Conditions of Recording:  This is a 21 channel routine scalp EEG performed with bipolar and monopolar montages arranged in accordance to the international 10/20 system of electrode placement. One channel was dedicated to EKG recording.  The patient is in the intubated and sedated state.  Description:  The background activity is slow and poorly organized.  It consists of a low voltage, slow, polymorphic delta activity.  This activity is diffusely distributed and continuous.  No epileptiform activity is noted.   Hyperventilation and intermittent photic stimulation were not performed.  IMPRESSION: This is an abnormal EEG secondary to general background slowing.  This finding may be seen with a diffuse disturbance that is etiologically nonspecific, but may include a metabolic encephalopathy or medication effect, among other possibilities.  No epileptiform activity was noted.     Thana Farr, MD Neurology [phone] 06/15/2018, 12:19 PM

## 2018-06-15 NOTE — Progress Notes (Signed)
Assisted tele visit to patient with girlfriend.   Analiya Porco P, RN   

## 2018-06-15 NOTE — Progress Notes (Signed)
Hypoglycemic Event  CBG: 68  Treatment: 12.5g D50  Symptoms: None   Follow-up CBG: Time: 2026 CBG Result:148  Possible Reasons for Event: No intake until today via tube feedings   Comments/MD notified: Paged with new results   Sherrie George, RN BSN

## 2018-06-15 NOTE — Progress Notes (Signed)
Called to bedside > patient experiencing active seizure.  On arrival, Dr. Wynona Neat at bedside.  Patient experiencing tonic seizure.  Transient desaturation. 4mg  IV ativan given, oral airway placed.  Saturations improved.  Propofol gtt started. EEG pending.  Neurology at bedside during episode.   Mother called and updated on patients status.   Canary Brim, NP-C [facility] Pgr: [phone] or if no answer [phone] 06/15/2018, 11:30 AM

## 2018-06-15 NOTE — Progress Notes (Signed)
Assisted tele visit to patient with mother.  Jesse Bribiesca Anderson, RN   

## 2018-06-15 NOTE — Progress Notes (Signed)
NAME:  Jesse [name], MRN:  [id number], DOB:  Jul 02, 1990, LOS: 2 ADMISSION DATE:  06/13/2018, CONSULTATION DATE: June 13, 2018 REFERRING MD: Dr. Pilar PlateBero EDP, CHIEF COMPLAINT: Altered mental status  Brief History   28 year old male found down in car with suspected drug overdose versus status epilepticus.  Intubated in the emergency department.  CT Head with concern by Neurology for bilateral basal ganglia hypodensities. Admitted to ICU on vent.    Past Medical History  Substance abuse Prior suicide attempt  Significant Hospital Events   4/20 Admit with AMS, intubated.   Consults:  Neurology 4/20 >  Procedures:  ETT 4/20 >  Significant Diagnostic Tests:  CT head 4/20 > official read as non-acute. Neurology feels there are bilateral basal ganglia hypodensities.   Micro Data:  Blood 4/20 > Trach aspirate 4/20 > HIV 4/20 > non reactive  Antimicrobials:  Unasyn 4/21 >>  Interim history/subjective:  Received fentanyl x1 overnight due to tachypnea.  Tmax 100.   Objective   Blood pressure 137/70, pulse (!) 103, temperature 100 F (37.8 C), resp. rate (!) 21, height 6' (1.829 m), weight 89.8 kg, SpO2 97 %.    Vent Mode: PSV;CPAP FiO2 (%):  [40 %] 40 % Set Rate:  [16 bmp] 16 bmp Vt Set:  [620 mL] 620 mL PEEP:  [5 cmH20] 5 cmH20 Pressure Support:  [5 cmH20] 5 cmH20 Plateau Pressure:  [13 cmH20-15 cmH20] 15 cmH20   Intake/Output Summary (Last 24 hours) at 06/15/2018 0934 Last data filed at 06/15/2018 0744 Gross per 24 hour  Intake 3631.8 ml  Output 1375 ml  Net 2256.8 ml   Filed Weights   06/13/18 1844 06/14/18 0415 06/15/18 0500  Weight: 80 kg 81 kg 89.8 kg    Examination: General: young adult male lying in bed, critically ill appearing HEENT: MM pink/moist, ETT Neuro: no response to verbal, pupils 613mm/R, on PSV, pt pulled away from examiner when eyes were opened CV: s1s2 rrr, no m/r/g PULM: even/non-labored, lungs bilaterally clear WU:JWJXGI:soft, non-tender, bsx4  active  Extremities: warm/dry, noedema  Skin: no rashes or lesions  Resolved Hospital Problem list   Circulatory shock   Assessment & Plan:   Acute Encephalopathy -DDx includes metabolic derangement, hypoxic or toxic ingestion. He was loaded with Keppra, but EEG done in ED and read by Neurohospitalist felt to not represent seizure, but diffuse slowing. UDS on admit positive for amphetamines, cocaine, and benzos (versed given in field) P: Supportive care Minimize sedation to allow for neuro exam  Appreciate Neurology Follow frequent neuro exams PT / OT efforts   Acute Respiratory Insufficiency  -in setting of AMS P: PRVC 8cc/kg as rest mode  Wean PEEP/O2 for sats >90% VAP Prevention measures Daily PSV as tolerated   RLL Aspiration PNA -in setting of AMS P: Continue unasyn  Follow CXR   SIRS -leukocytosis and hypothermia on admit, suspected RLL aspiration PNA  P: ABX as above  Follow cultures  Trend PCT  AKI -secondary to hypovolemia and rhabdomyolysis (CK 511) Rhabdomyolysis  Hyperkalemia P: Continue IVF to 17225ml/hr  Follow CK Trend BMP / urinary output Replace electrolytes as indicated Avoid nephrotoxic agents, ensure adequate renal perfusion  Shock Liver  P: Trend LFT's   At Risk Malnutrition  P: TF per Nutrition   Best practice:  Diet: NPO, begin TF Pain/Anxiety/Delirium protocol (if indicated): PRN sedation VAP protocol (if indicated): Per protocol DVT prophylaxis: SCDs GI prophylaxis: Protonix Glucose control: N/a Mobility: BR Code Status: Full Family Communication: Will call  parents 4/23.  Mother updated 4/22 via phone.  Disposition: ICU  Labs   CBC: Recent Labs  Lab 06/13/18 1609 06/13/18 1811 06/14/18 0408 06/15/18 0143  WBC 29.9*  --  16.8* 21.1*  NEUTROABS 24.2*  --   --   --   HGB 14.3 11.2* 14.8 14.6  HCT 43.9 33.0* 42.1 41.8  MCV 94.4  --  88.4 89.7  PLT 264  --  226 163    Basic Metabolic Panel: Recent Labs  Lab  06/13/18 1609 06/13/18 1811 06/13/18 2148 06/14/18 0408 06/15/18 0143  NA 142 142  --  141 140  K 5.5* 4.5  --  4.0 3.6  CL 106  --   --  107 105  CO2 22  --   --  24 25  GLUCOSE 124*  --   --  100* 119*  BUN 18  --   --  17 15  CREATININE 1.81*  --   --  1.09 0.86  CALCIUM 9.2  --   --  9.0 8.6*  MG  --   --  1.8 1.5*  --   PHOS  --   --  4.8* 3.5  --    GFR: Estimated Creatinine Clearance: 144.1 mL/min (by C-G formula based on SCr of 0.86 mg/dL). Recent Labs  Lab 06/13/18 1609 06/13/18 2148 06/14/18 0408 06/15/18 0143  PROCALCITON  --  21.28 24.42 11.91  WBC 29.9*  --  16.8* 21.1*  LATICACIDVEN 7.2* 4.2*  --   --     Liver Function Tests: Recent Labs  Lab 06/13/18 1609 06/14/18 0408 06/15/18 0143  AST 940* 1,516* 569*  ALT 1,201* 1,382* 940*  ALKPHOS 66 63 59  BILITOT 0.4 0.8 0.9  PROT 7.0 6.2* 5.7*  ALBUMIN 4.0 3.6 2.8*   No results for input(s): LIPASE, AMYLASE in the last 168 hours. No results for input(s): AMMONIA in the last 168 hours.  ABG    Component Value Date/Time   PHART 7.352 06/13/2018 1811   PCO2ART 39.7 06/13/2018 1811   PO2ART 261.0 (H) 06/13/2018 1811   HCO3 22.0 06/13/2018 1811   TCO2 23 06/13/2018 1811   ACIDBASEDEF 3.0 (H) 06/13/2018 1811   O2SAT 100.0 06/13/2018 1811     Coagulation Profile: Recent Labs  Lab 06/13/18 1609  INR 1.2    Cardiac Enzymes: Recent Labs  Lab 06/13/18 1609 06/14/18 0408 06/15/18 0143  CKTOTAL 511* 13,371* 7,844*    HbA1C: No results found for: HGBA1C  CBG: Recent Labs  Lab 06/14/18 1529 06/14/18 1952 06/14/18 2336 06/15/18 0333 06/15/18 0822  GLUCAP 142* 101* 123* 130* 116*     Critical care time: 36 minutes    Canary Brim, NP-C [facility] Pgr: [phone] or if no answer [phone] 06/15/2018, 9:34 AM

## 2018-06-15 NOTE — Progress Notes (Signed)
Neurology Progress Note   S:// No acute changes overnight. Not much change in terms of his examination for the RN.  O:// Current vital signs: BP 136/78   Pulse 100   Temp (!) 100.4 F (38 C)   Resp 16   Ht 6' (1.829 m)   Wt 89.8 kg   SpO2 97%   BMI 26.85 kg/m  Vital signs in last 24 hours: Temp:  [100.4 F (38 C)-101.8 F (38.8 C)] 100.4 F (38 C) (04/22 0600) Pulse Rate:  [95-159] 100 (04/22 0600) Resp:  [12-53] 16 (04/22 0600) BP: (125-152)/(69-106) 136/78 (04/22 0600) SpO2:  [94 %-100 %] 97 % (04/22 0600) FiO2 (%):  [40 %] 40 % (04/22 0420) Weight:  [89.8 kg] 89.8 kg (04/22 0500) General: Intubated, off propofol. HEENT: Normocephalic atraumatic intubated Cardiovascular: Regular rate rhythm normal sounds Respiratory: Vented, no distress Abdomen: Soft nondistended Neurological examination Does not open eyes to voice. He is not sedated today. He remains to be intubated Pupils are 3 mm round reactive to light Corneal reflexes are present He is breathing over the ventilator Cough and gag reflexes are present Cranial nerves: Pupils 3 mm round reactive to light, no forced gaze, he did attempt to shut his eyes closed when I try to open them to examine, difficult ascertain facial symmetry due to the endotracheal tube, does not blink from either side to threat. Motor exam: No spontaneous movements noted.  Normal for stimulation to the upper and lower extremity-to the upper extremity noxious stimulation he has nearly extensor posturing, to noxious stimulation on the lower extremity he adducts his hips bilaterally and extends on his knees and plantar flexes on the ankles. Sensory exam: As above Gait and coordination cannot be tested at this time due to mentation. Medications  Current Facility-Administered Medications:  .  0.9 %  sodium chloride infusion, 250 mL, Intravenous, Continuous, Bowser, Grace E, NP .  0.9 %  sodium chloride infusion, , Intravenous, Continuous, Ollis,  Brandi L, NP, Last Rate: 125 mL/hr at 06/15/18 0236 .  acetaminophen (TYLENOL) solution 650 mg, 650 mg, Per Tube, Q6H PRN, Brand Males, MD, 650 mg at 06/14/18 2120 .  albuterol (PROVENTIL) (2.5 MG/3ML) 0.083% nebulizer solution 2.5 mg, 2.5 mg, Nebulization, Q2H PRN, Bowser, Laurel Dimmer, NP .  Ampicillin-Sulbactam (UNASYN) 3 g in sodium chloride 0.9 % 100 mL IVPB, 3 g, Intravenous, Q6H, Skeet Simmer, RPH, Last Rate: 200 mL/hr at 06/15/18 0530, 3 g at 06/15/18 0530 .  bisacodyl (DULCOLAX) suppository 10 mg, 10 mg, Rectal, Daily PRN, Bowser, Laurel Dimmer, NP .  chlorhexidine gluconate (MEDLINE KIT) (PERIDEX) 0.12 % solution 15 mL, 15 mL, Mouth Rinse, BID, Agarwala, Ravi, MD, 15 mL at 06/14/18 2004 .  docusate (COLACE) 50 MG/5ML liquid 100 mg, 100 mg, Per Tube, BID PRN, Bowser, Laurel Dimmer, NP .  feeding supplement (PRO-STAT SUGAR FREE 64) liquid 30 mL, 30 mL, Per Tube, Daily, Ollis, Brandi L, NP .  feeding supplement (VITAL 1.5 CAL) liquid 1,000 mL, 1,000 mL, Per Tube, Continuous, Ramaswamy, Murali, MD, Last Rate: 65 mL/hr at 06/15/18 0100, 1,000 mL at 06/15/18 0100 .  fentaNYL (SUBLIMAZE) injection 50-100 mcg, 50-100 mcg, Intravenous, Q30 min PRN, Ollis, Brandi L, NP, 100 mcg at 06/15/18 0418 .  MEDLINE mouth rinse, 15 mL, Mouth Rinse, 10 times per day, Agarwala, Einar Grad, MD, 15 mL at 06/15/18 0616 .  midazolam (VERSED) injection 1 mg, 1 mg, Intravenous, Q2H PRN, Ollis, Brandi L, NP .  ondansetron (ZOFRAN) injection 4 mg, 4  mg, Intravenous, Q6H PRN, Bowser, Laurel Dimmer, NP .  pantoprazole sodium (PROTONIX) 40 mg/20 mL oral suspension 40 mg, 40 mg, Per Tube, Daily, Agarwala, Ravi, MD, 40 mg at 06/14/18 2126 .  propofol (DIPRIVAN) 1000 MG/100ML infusion, 5-80 mcg/kg/min, Intravenous, Titrated, Judd Lien, MD, Stopped at 06/14/18 0830 Labs CBC    Component Value Date/Time   WBC 21.1 (H) 06/15/2018 0143   RBC 4.66 06/15/2018 0143   HGB 14.6 06/15/2018 0143   HCT 41.8 06/15/2018 0143   PLT 163 06/15/2018  0143   MCV 89.7 06/15/2018 0143   MCH 31.3 06/15/2018 0143   MCHC 34.9 06/15/2018 0143   RDW 12.7 06/15/2018 0143   LYMPHSABS 0.9 06/13/2018 1609   MONOABS 3.9 (H) 06/13/2018 1609   EOSABS 0.0 06/13/2018 1609   BASOSABS 0.0 06/13/2018 1609    CMP     Component Value Date/Time   NA 140 06/15/2018 0143   K 3.6 06/15/2018 0143   CL 105 06/15/2018 0143   CO2 25 06/15/2018 0143   GLUCOSE 119 (H) 06/15/2018 0143   BUN 15 06/15/2018 0143   CREATININE 0.86 06/15/2018 0143   CALCIUM 8.6 (L) 06/15/2018 0143   PROT 5.7 (L) 06/15/2018 0143   ALBUMIN 2.8 (L) 06/15/2018 0143   AST 569 (H) 06/15/2018 0143   ALT 940 (H) 06/15/2018 0143   ALKPHOS 59 06/15/2018 0143   BILITOT 0.9 06/15/2018 0143   GFRNONAA >60 06/15/2018 0143   GFRAA >60 06/15/2018 0143    glycosylated hemoglobin  Lipid Panel     Component Value Date/Time   TRIG 69 06/14/2018 0408     Imaging I have reviewed images in epic and the results pertinent to this consultation are: No new brain imaging.   Prior review: CT scan of the brain with hypodensity in the lentiform nuclei bilaterally. MRI of the brain shows multiple areas of restricted diffusion, predominantly in the cerebellar hemispheres which might be representative of hypoxic/anoxic injury but specifically in bilateral globus pallidus and lentiform nuclei on the right indicating possible carbon monoxide poisoning as etiology.  There are also other small areas of restricted diffusion in a pattern that appears possibly watershed looking as well.  Unifying Link Snuffer is it is a combination of car monoxide poisoning, hypoxia/anoxia as well as hypotension leading to some watershed damage.  There is also some petechial hemorrhage on the susceptibility weighted imaging in the deep gray matter nuclei which could be related to either car monoxide or hypoxia. Intracranial MRA was negative for any acute occlusion.  Chest imaging reveals a right lower lobe infiltrate-likely  aspiration pneumonia  Assessment: 28 year old man with no significant medical past history but history of alcohol and drug abuse as found later upon information by family, found unresponsive in his car in a motel parking lot with the car engine and [employer] running, unknown downtime, brought in with concern for unresponsiveness and possible twitching and upward gaze. Laboratory finding on arrival were concerning for hypoxia and acidosis.  Urine toxicology screen positive for cocaine and amphetamines-although possibly has prescribed Vyvanse per new information from family. Intubated because of depressed mental status and inability to protect airway. Head CT concerning for bilateral basal ganglia hypodensities, followed by the MRI that confirmed multiple areas of restricted diffusion in cerebellar hemispheres as well as levels palate is a 95 nuclei bilaterally indicating hypoxic/anoxic injury as well as possibly car monoxide poisoning and potentially some watershed infarction due to hypoxia around the time after intubation. His exam remains unchanged although he  has not been agitated and not required sedation overnight and is off of sedation right now.   Impression: Toxic metabolic encephalopathy in the setting of hypoxic/anoxic brain injury, possible carbon monoxide poisoning and drug overdose along with possible bilateral watershed infarcts from hypotension.  Recommendations: -I do not see a need for keeping him on antiepileptics at this time as his EEG was negative for any seizure-like activity or focality. -Supportive care per primary team. -He remains critically ill,-continue frequent neurochecks. -If the cause of injury is monoxide poisoning or drug overdose, as he metabolizes the drugs and gets oxygenated well, we should expect to see some improvement in his examination. At this time, I am unable to clearly layout any prognostication's- he would need time prior to any long-term prognostication can be  made.  -- Amie Portland, MD [facility] Pager: [phone] If 7pm to 7am, please call on call as listed on AMION.  CRITICAL CARE ATTESTATION Performed by: Amie Portland, MD Total critical care time: 30 minutes Critical care time was exclusive of separately billable procedures and treating other patients and/or supervising APPs/Residents/Students Critical care was necessary to treat or prevent imminent or life-threatening deterioration due to Toxic metabolic encephalopathy in the setting of hypoxic/anoxic brain injury, possible carbon monoxide poisoning and drug overdose along with possible bilateral watershed infarcts from hypotension. This patient is critically ill and at significant risk for neurological worsening and/or death and care requires constant monitoring. Critical care was time spent personally by me on the following activities: development of treatment plan with patient and/or surrogate as well as nursing, discussions with consultants, evaluation of patient's response to treatment, examination of patient, obtaining history from patient or surrogate, ordering and performing treatments and interventions, ordering and review of laboratory studies, ordering and review of radiographic studies, pulse oximetry, re-evaluation of patient's condition, participation in multidisciplinary rounds and medical decision making of high complexity in the care of this patient.

## 2018-06-15 NOTE — Progress Notes (Signed)
Neurology recalled to evaluate the patient for possible seizure. Brief episode of eye deviation upwards, full body stiffness and possible shaking that broke with 4 mg of Ativan. Stat EEG ordered At the time of this episode, patient completely unresponsive to any noxious stimulation. Stat EEG preliminary review-slowing only-no seizures. Loaded with fosphenytoin 20 mg/kg We will start Keppra 500 twice daily although not entirely sure if this was a seizure versus abnormal movement secondary to bilateral basal ganglia involvement.  We will continue to follow with you  -- Milon Dikes, MD [facility] Pager: [phone] If 7pm to 7am, please call on call as listed on AMION.

## 2018-06-16 DIAGNOSIS — J690 Pneumonitis due to inhalation of food and vomit: Secondary | ICD-10-CM

## 2018-06-16 LAB — GLUCOSE, CAPILLARY
Glucose-Capillary: 103 mg/dL — ABNORMAL HIGH (ref 70–99)
Glucose-Capillary: 112 mg/dL — ABNORMAL HIGH (ref 70–99)
Glucose-Capillary: 117 mg/dL — ABNORMAL HIGH (ref 70–99)
Glucose-Capillary: 138 mg/dL — ABNORMAL HIGH (ref 70–99)
Glucose-Capillary: 97 mg/dL (ref 70–99)
Glucose-Capillary: 97 mg/dL (ref 70–99)
Glucose-Capillary: 98 mg/dL (ref 70–99)

## 2018-06-16 LAB — COMPREHENSIVE METABOLIC PANEL
ALT: 518 U/L — ABNORMAL HIGH (ref 0–44)
AST: 280 U/L — ABNORMAL HIGH (ref 15–41)
Albumin: 2.4 g/dL — ABNORMAL LOW (ref 3.5–5.0)
Alkaline Phosphatase: 51 U/L (ref 38–126)
Anion gap: 8 (ref 5–15)
BUN: 10 mg/dL (ref 6–20)
CO2: 24 mmol/L (ref 22–32)
Calcium: 8.1 mg/dL — ABNORMAL LOW (ref 8.9–10.3)
Chloride: 110 mmol/L (ref 98–111)
Creatinine, Ser: 0.73 mg/dL (ref 0.61–1.24)
GFR calc Af Amer: >60 mL/min (ref >60)
GFR calc non Af Amer: >60 mL/min (ref >60)
Glucose, Bld: 151 mg/dL — ABNORMAL HIGH (ref 70–99)
Potassium: 3.6 mmol/L (ref 3.5–5.1)
Sodium: 142 mmol/L (ref 135–145)
Total Bilirubin: 0.8 mg/dL (ref 0.3–1.2)
Total Protein: 5.2 g/dL — ABNORMAL LOW (ref 6.5–8.1)

## 2018-06-16 LAB — CBC
HCT: 35.1 % — ABNORMAL LOW (ref 39.0–52.0)
Hemoglobin: 12 g/dL — ABNORMAL LOW (ref 13.0–17.0)
MCH: 30.9 pg (ref 26.0–34.0)
MCHC: 34.2 g/dL (ref 30.0–36.0)
MCV: 90.5 fL (ref 80.0–100.0)
Platelets: 136 10*3/uL — ABNORMAL LOW (ref 150–400)
RBC: 3.88 MIL/uL — ABNORMAL LOW (ref 4.22–5.81)
RDW: 12.6 % (ref 11.5–15.5)
WBC: 15.4 10*3/uL — ABNORMAL HIGH (ref 4.0–10.5)
nRBC: 0 % (ref 0.0–0.2)

## 2018-06-16 LAB — PHENYTOIN LEVEL, TOTAL: Phenytoin Lvl: 8.7 ug/mL — ABNORMAL LOW (ref 10.0–20.0)

## 2018-06-16 NOTE — Progress Notes (Addendum)
Occupational Therapy Progress Note  Splints removed with redness noted bil. Palms that dissipated within 10 mins.   He appears to be tolerating the current splint schedule.  PROM performed bil. LEs.    06/16/18 1800  OT Visit Information  Last OT Received On 06/16/18  Assistance Needed +2  History of Present Illness 28 year old male found down in car with suspected drug overdose versus status epilepticus. Intubated 4/20. MRI showing multiple areas of restricted diffusion, predominantly in the cerebellar hemispheres which might be representative of hypoxic/anoxic injury but specifically in bilateral globus pallidus and lentiform nuclei on the right. No significant medical past history but history of alcohol and drug abuse.   Precautions  Precautions Fall;Other (comment)  Pain Assessment  Pain Assessment Faces  Faces Pain Scale 0  Pain Intervention(s) Monitored during session  Cognition  Arousal/Alertness Lethargic  Behavior During Therapy Flat affect  Overall Cognitive Status Impaired/Different from baseline  General Comments Pt unresponsive, pt with decorticate posturing with stimulation   Upper Extremity Assessment  RUE Deficits / Details Pt with decererbrate posturing, but able to achieve full PROM bil. UEs   RUE Coordination decreased gross motor;decreased fine motor  LUE Deficits / Details Pt with decererbrate posturing, but able to achieve full PROM bil. UEs  LUE Coordination decreased fine motor;decreased gross motor  Lower Extremity Assessment  RLE Deficits / Details decerebrate posturing noted intermittently   LLE Deficits / Details decereberate posturing noted intermittently   ADL  General ADL Comments Total A for ADLs. Educating RN on splint wear schedule and ROM.   Exercises  Exercises General Lower Extremity  General Exercises - Lower Extremity  Ankle Circles/Pumps PROM;Left;Right;5 reps;Supine  Heel Slides PROM;Right;Left;5 reps;Supine  Hip ABduction/ADduction  PROM;Right;Left;5 reps;Supine  Hip Flexion/Marching PROM;Right;Left;5 reps;Supine  Other Exercises  Other Exercises splints removed.  Redness noted bil. palm, but blanches easily and dissipated within 10 mins.  PROM performed bil. LEs.  Spoke with RN to continue established splint schedule   Other Exercises Pt positioned in dorsiflexion with folded towels at bottom of bed.  Instructed RN to monitor and change position in 30-60 mins   OT - End of Session  Activity Tolerance Patient limited by lethargy  Patient left in bed  OT Assessment/Plan  OT Plan Discharge plan remains appropriate  OT Visit Diagnosis Muscle weakness (generalized) (M62.81);Other symptoms and signs involving cognitive function  OT Frequency (ACUTE ONLY) Min 3X/week  Follow Up Recommendations Other (comment) (to be determined )  OT Equipment None recommended by OT  AM-PAC OT "6 Clicks" Daily Activity Outcome Measure (Version 2)  Help from another person eating meals? 1  Help from another person taking care of personal grooming? 1  Help from another person toileting, which includes using toliet, bedpan, or urinal? 1  Help from another person bathing (including washing, rinsing, drying)? 1  Help from another person to put on and taking off regular upper body clothing? 1  Help from another person to put on and taking off regular lower body clothing? 1  6 Click Score 6  OT Goal Progression  Progress towards OT goals Progressing toward goals  OT Time Calculation  OT Start Time (ACUTE ONLY) 1426  OT Stop Time (ACUTE ONLY) 1436  OT Time Calculation (min) 10 min  OT General Charges  $OT Visit 1 Visit  OT Treatments  $Therapeutic Activity 8-22 mins  [name], OTR/L [facility] Pager [phone] Office [phone]

## 2018-06-16 NOTE — Progress Notes (Signed)
Assisted tele visit to patient with family member.  Jesse [name] P, RN  

## 2018-06-16 NOTE — Progress Notes (Addendum)
Neurology Progress Note   S:// Patient had a concerning seizure-like spell yesterday.  Unclear if that was a true seizure.  Stat EEG was negative for any acute process of focal findings. Continues to have posturing on stimulation and no other activities noted overnight.  O:// Current vital signs: BP (!) 145/76   Pulse (!) 107   Temp (!) 100.4 F (38 C) (Axillary)   Resp 19   Ht 6' (1.829 m)   Wt 91 kg   SpO2 98%   BMI 27.21 kg/m  Vital signs in last 24 hours: Temp:  [99.5 F (37.5 C)-100.5 F (38.1 C)] 100.4 F (38 C) (04/23 0800) Pulse Rate:  [86-138] 107 (04/23 0900) Resp:  [9-30] 19 (04/23 0900) BP: (96-146)/(62-80) 145/76 (04/23 0900) SpO2:  [21 %-100 %] 98 % (04/23 0900) FiO2 (%):  [40 %-100 %] 50 % (04/23 0835) Weight:  [91 kg] 91 kg (04/23 0500) Exam remains unchanged General: Intubated, off propofol. HEENT: Normocephalic atraumatic intubated Cardiovascular: Regular rate rhythm normal sounds Respiratory: Vented, no distress Abdomen: Soft nondistended Neurological examination Does not open eyes to voice. He is not sedated today. He remains to be intubated Pupils are 3 mm round reactive to light Corneal reflexes are present He is breathing over the ventilator Cough and gag reflexes are present Cranial nerves: Pupils 3 mm round reactive to light, no forced gaze, he did attempt to shut his eyes closed when I try to open them to examine, difficult ascertain facial symmetry due to the endotracheal tube, does not blink from either side to threat. Motor exam: No spontaneous movements noted.  Normal for stimulation to the upper and lower extremity-to the upper extremity noxious stimulation he has nearly extensor posturing, to noxious stimulation on the lower extremity he adducts his hips bilaterally and extends on his knees and plantar flexes on the ankles. Sensory exam: As above Gait and coordination cannot be tested at this time due to  mentation.  Medications  Current Facility-Administered Medications:  .  0.9 %  sodium chloride infusion, 250 mL, Intravenous, Continuous, Bowser, Grace E, NP .  0.9 %  sodium chloride infusion, , Intravenous, Continuous, Ollis, Brandi L, NP, Last Rate: 125 mL/hr at 06/16/18 0900 .  acetaminophen (TYLENOL) solution 650 mg, 650 mg, Per Tube, Q6H PRN, Brand Males, MD, 650 mg at 06/16/18 0525 .  albuterol (PROVENTIL) (2.5 MG/3ML) 0.083% nebulizer solution 2.5 mg, 2.5 mg, Nebulization, Q2H PRN, Bowser, Laurel Dimmer, NP .  Ampicillin-Sulbactam (UNASYN) 3 g in sodium chloride 0.9 % 100 mL IVPB, 3 g, Intravenous, Q6H, Skeet Simmer, RPH, Stopped at 06/16/18 0600 .  bisacodyl (DULCOLAX) suppository 10 mg, 10 mg, Rectal, Daily PRN, Bowser, Laurel Dimmer, NP .  chlorhexidine gluconate (MEDLINE KIT) (PERIDEX) 0.12 % solution 15 mL, 15 mL, Mouth Rinse, BID, Agarwala, Ravi, MD, 15 mL at 06/16/18 0800 .  docusate (COLACE) 50 MG/5ML liquid 100 mg, 100 mg, Per Tube, BID PRN, Bowser, Laurel Dimmer, NP .  feeding supplement (PRO-STAT SUGAR FREE 64) liquid 30 mL, 30 mL, Per Tube, Daily, Ollis, Brandi L, NP, 30 mL at 06/16/18 0947 .  feeding supplement (VITAL 1.5 CAL) liquid 1,000 mL, 1,000 mL, Per Tube, Continuous, Ramaswamy, Murali, MD, Last Rate: 65 mL/hr at 06/15/18 2245, 1,000 mL at 06/15/18 2245 .  fentaNYL (SUBLIMAZE) injection 50-100 mcg, 50-100 mcg, Intravenous, Q30 min PRN, Ollis, Brandi L, NP, 100 mcg at 06/16/18 0451 .  levETIRAcetam (KEPPRA) IVPB 500 mg/100 mL premix, 500 mg, Intravenous, Q12H, Amie Portland, MD,  Last Rate: 400 mL/hr at 06/16/18 0947, 500 mg at 06/16/18 0947 .  MEDLINE mouth rinse, 15 mL, Mouth Rinse, 10 times per day, Kipp Brood, MD, 15 mL at 06/16/18 0947 .  midazolam (VERSED) injection 1 mg, 1 mg, Intravenous, Q2H PRN, Ollis, Brandi L, NP .  ondansetron (ZOFRAN) injection 4 mg, 4 mg, Intravenous, Q6H PRN, Bowser, Grace E, NP .  pantoprazole sodium (PROTONIX) 40 mg/20 mL oral suspension 40  mg, 40 mg, Per Tube, Daily, Agarwala, Ravi, MD, 40 mg at 06/15/18 2150 .  [COMPLETED] propofol (DIPRIVAN) bolus via infusion 89.8 mg, 1 mg/kg, Intravenous, Once, 89.8 mg at 06/15/18 1133 **AND** propofol (DIPRIVAN) 1000 MG/100ML infusion, 0-20 mcg/kg/min, Intravenous, Continuous, Amie Portland, MD, Last Rate: 8.08 mL/hr at 06/16/18 0900, 15 mcg/kg/min at 06/16/18 0900 Labs CBC    Component Value Date/Time   WBC 15.4 (H) 06/16/2018 0239   RBC 3.88 (L) 06/16/2018 0239   HGB 12.0 (L) 06/16/2018 0239   HCT 35.1 (L) 06/16/2018 0239   PLT 136 (L) 06/16/2018 0239   MCV 90.5 06/16/2018 0239   MCH 30.9 06/16/2018 0239   MCHC 34.2 06/16/2018 0239   RDW 12.6 06/16/2018 0239   LYMPHSABS 0.9 06/13/2018 1609   MONOABS 3.9 (H) 06/13/2018 1609   EOSABS 0.0 06/13/2018 1609   BASOSABS 0.0 06/13/2018 1609    CMP     Component Value Date/Time   NA 142 06/16/2018 0239   K 3.6 06/16/2018 0239   CL 110 06/16/2018 0239   CO2 24 06/16/2018 0239   GLUCOSE 151 (H) 06/16/2018 0239   BUN 10 06/16/2018 0239   CREATININE 0.73 06/16/2018 0239   CALCIUM 8.1 (L) 06/16/2018 0239   PROT 5.2 (L) 06/16/2018 0239   ALBUMIN 2.4 (L) 06/16/2018 0239   AST 280 (H) 06/16/2018 0239   ALT 518 (H) 06/16/2018 0239   ALKPHOS 51 06/16/2018 0239   BILITOT 0.8 06/16/2018 0239   GFRNONAA >60 06/16/2018 0239   GFRAA >60 06/16/2018 0239   Imaging No new imaging to review.  EEG with slowing only Repeat EEG yesterday, when there was a clinical concern for seizure, also only slowing-no focality.  Assessment: 28 year old man with no significant past medical history other than of drug and alcohol abuse, found down in his car in a parking lot unknown downtime, concern for seizures, intubated for airway protection with imaging concerning for hypoxic/anoxic injury as well as possible carbon monoxide toxicity. Yesterday had an episode of questionable jerking/stiffening of his body which was concerning for seizures but the EEG done  stat did not reveal any underlying electrographic correlation. 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 I have started him on an antiepileptic and would continue that although I am not thoroughly convinced that he does have a seizure disorder but he does have a high risk of developing them.  Exam remains positive for all brainstem reflexes being intact but no purposeful cortical function is elicitable yet.  Impression: Toxic metabolic encephalopathy -secondary to drug overdose as well as possible bilateral watershed infarcts. 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  Recommendations: I would continue Keppra for now Supportive treatment per primary team as you are I will continue to follow him with frequent neurological exams with the hope that given his young age, he would  turn relief and start showing some signs of recovery in terms of higher cortical function, which still date has not happened. His prognosis remains very guarded at this time.  -- Amie Portland, MD [facility] Pager: [phone] If 7pm to 7am, please call on call as listed on [facility].  CRITICAL CARE ATTESTATION Performed by: Amie Portland, MD Total critical care time: 30 minutes Critical care time was exclusive of separately billable procedures and treating other patients and/or supervising APPs/Residents/Students Critical care was necessary to treat or prevent imminent or life-threatening deterioration due to hypoxic anoxic brain injury This patient is critically ill and at significant risk for neurological worsening and/or death and care requires constant monitoring. Critical care was time spent personally by me on the following  activities: development of treatment plan with patient and/or surrogate as well as nursing, discussions with consultants, evaluation of patient's response to treatment, examination of patient, obtaining history from patient or surrogate, ordering and performing treatments and interventions, ordering and review of laboratory studies, ordering and review of radiographic studies, pulse oximetry, re-evaluation of patient's condition, participation in multidisciplinary rounds and medical decision making of high complexity in the care of this patient.

## 2018-06-16 NOTE — Progress Notes (Signed)
NAME:  Jesse [name], MRN:  [id number], DOB:  10-Jan-1991, LOS: 3 ADMISSION DATE:  06/13/2018, CONSULTATION DATE: June 13, 2018 REFERRING MD: Dr. Pilar Plate EDP, CHIEF COMPLAINT: Altered mental status  Brief History   28 year old male found down in car with suspected drug overdose versus status epilepticus.  Intubated in the emergency department.  CT Head with concern by Neurology for bilateral basal ganglia hypodensities. Admitted to ICU on vent.    Past Medical History  Substance abuse Prior suicide attempt  Significant Hospital Events   4/20 Admit with AMS, intubated.   Consults:  Neurology 4/20 >  Procedures:  ETT 4/20 >  Significant Diagnostic Tests:  CT head 4/20 > official read as non-acute. Neurology feels there are bilateral basal ganglia hypodensities.   Micro Data:  Blood 4/20 > Trach aspirate 4/20 > HIV 4/20 > non reactive  Antimicrobials:  Unasyn 4/21 >>day 3  Interim history/subjective:  Received fentanyl x1 overnight due to tachypnea.  Tmax 100.4   Objective   Blood pressure (!) 145/76, pulse (!) 107, temperature (!) 100.4 F (38 C), temperature source Axillary, resp. rate 19, height 6' (1.829 m), weight 91 kg, SpO2 98 %.    Vent Mode: PRVC FiO2 (%):  [40 %-100 %] 50 % Set Rate:  [16 bmp] 16 bmp Vt Set:  [620 mL] 620 mL PEEP:  [5 cmH20] 5 cmH20 Plateau Pressure:  [16 cmH20-18 cmH20] 16 cmH20   Intake/Output Summary (Last 24 hours) at 06/16/2018 1045 Last data filed at 06/16/2018 0900 Gross per 24 hour  Intake 4283 ml  Output 2010 ml  Net 2273 ml   Filed Weights   06/14/18 0415 06/15/18 0500 06/16/18 0500  Weight: 81 kg 89.8 kg 91 kg    Examination: Young gentleman, critically ill Moist oral mucosa No response to verbal stimuli pupils about 3 mm, sluggish S1-S2 appreciated Lungs clear bilaterally Bowel sounds appreciated Skin is warm and dry  Resolved Hospital Problem list   Circulatory shock   Assessment & Plan:   Acute  encephalopathy -Metabolic derangement, hypoxemic or toxic ingestion -Loaded with Keppra -EEG did not reveal seizures -UDS on admit positive for amphetamines, cocaine, benzodiazepines -Continue supportive measures -Neurology following -Frequent neuro checks  Acute respiratory sufficiency -In the context of altered mentation -Unable to protect airway -Continue ventilator support -Not an extubation candidate at present based on his mental status -Continue PRVC mode of ventilation  Right lower lobe aspiration pneumonia -Continue Unasyn -Follow radiological changes   SIRS -leukocytosis and hypothermia on admit, suspected RLL aspiration PNA  P: Follow cultures Trend CBC  Acute kidney injury -Secondary to hypovolemia, rhabdomyolysis -Continue IV fluids at 125 cc an hour -Follow CK-improving -Trend BMP -Avoid nephrotoxic's  Shock liver -Trend LFTs-improving  At Risk Malnutrition  P: TF per Nutrition   Arterial blood gases noted  Best practice:  Diet: Tube feeds Pain/Anxiety/Delirium protocol (if indicated): PRN sedation VAP protocol (if indicated): Per protocol DVT prophylaxis: SCDs GI prophylaxis: Protonix Glucose control: N/a Mobility: BR Code Status: Full Family Communication: Will call parents 4/23.  Mother updated 4/22 via phone.  Disposition: ICU  Labs   CBC: Recent Labs  Lab 06/13/18 1609 06/13/18 1811 06/14/18 0408 06/15/18 0143 06/16/18 0239  WBC 29.9*  --  16.8* 21.1* 15.4*  NEUTROABS 24.2*  --   --   --   --   HGB 14.3 11.2* 14.8 14.6 12.0*  HCT 43.9 33.0* 42.1 41.8 35.1*  MCV 94.4  --  88.4 89.7 90.5  PLT  264  --  226 163 136*    Basic Metabolic Panel: Recent Labs  Lab 06/13/18 1609 06/13/18 1811 06/13/18 2148 06/14/18 0408 06/15/18 0143 06/16/18 0239  NA 142 142  --  141 140 142  K 5.5* 4.5  --  4.0 3.6 3.6  CL 106  --   --  107 105 110  CO2 22  --   --  24 25 24   GLUCOSE 124*  --   --  100* 119* 151*  BUN 18  --   --  17 15  10   CREATININE 1.81*  --   --  1.09 0.86 0.73  CALCIUM 9.2  --   --  9.0 8.6* 8.1*  MG  --   --  1.8 1.5*  --   --   PHOS  --   --  4.8* 3.5  --   --    GFR: Estimated Creatinine Clearance: 154.9 mL/min (by C-G formula based on SCr of 0.73 mg/dL). Recent Labs  Lab 06/13/18 1609 06/13/18 2148 06/14/18 0408 06/15/18 0143 06/16/18 0239  PROCALCITON  --  21.28 24.42 11.91  --   WBC 29.9*  --  16.8* 21.1* 15.4*  LATICACIDVEN 7.2* 4.2*  --   --   --     Liver Function Tests: Recent Labs  Lab 06/13/18 1609 06/14/18 0408 06/15/18 0143 06/16/18 0239  AST 940* 1,516* 569* 280*  ALT 1,201* 1,382* 940* 518*  ALKPHOS 66 63 59 51  BILITOT 0.4 0.8 0.9 0.8  PROT 7.0 6.2* 5.7* 5.2*  ALBUMIN 4.0 3.6 2.8* 2.4*   ABG    Component Value Date/Time   PHART 7.352 06/13/2018 1811   PCO2ART 39.7 06/13/2018 1811   PO2ART 261.0 (H) 06/13/2018 1811   HCO3 22.0 06/13/2018 1811   TCO2 23 06/13/2018 1811   ACIDBASEDEF 3.0 (H) 06/13/2018 1811   O2SAT 100.0 06/13/2018 1811     Coagulation Profile: Recent Labs  Lab 06/13/18 1609  INR 1.2    Cardiac Enzymes: Recent Labs  Lab 06/13/18 1609 06/14/18 0408 06/15/18 0143 06/15/18 1920  CKTOTAL 511* 13,371* 7,844* 5,159*    HbA1C: No results found for: HGBA1C  CBG: Recent Labs  Lab 06/15/18 1939 06/15/18 2025 06/15/18 2359 06/16/18 0351 06/16/18 0739  GLUCAP 68* 148* 97 103* 138*     Critical care time: 30 minutes of critical care time spent evaluating patient, reviewing records, formulating plan of care   Virl DiamondAdewale Graesyn Schreifels, MD

## 2018-06-16 NOTE — Progress Notes (Signed)
Assisted tele visit to patient with mother.  Kyrel Leighton R, RN  

## 2018-06-16 NOTE — Progress Notes (Addendum)
Occupational Therapy Treatment Patient Details Name: Jesse [name] MRN: [id number] DOB: 21-Jan-1991 Today's Date: 06/16/2018    History of present illness 28 year old male found down in car with suspected drug overdose versus status epilepticus. Intubated 4/20. MRI showing multiple areas of restricted diffusion, predominantly in the cerebellar hemispheres which might be representative of hypoxic/anoxic injury but specifically in bilateral globus pallidus and lentiform nuclei on the right. No significant medical past history but history of alcohol and drug abuse.    OT comments  PROM performed bil. UEs -decererbrate posturing noted with stimulation, but full PROM achieved.  Bil. Resting hand splints applied.   Follow Up Recommendations  Other (comment)(to be determined )    Equipment Recommendations  None recommended by OT    Recommendations for Other Services PT consult    Precautions / Restrictions Precautions Precautions: Fall;Other (comment)       Mobility Bed Mobility                  Transfers                      Balance                                           ADL either performed or assessed with clinical judgement   ADL                                               Vision       Perception     Praxis      Cognition Arousal/Alertness: Lethargic Behavior During Therapy: Flat affect Overall Cognitive Status: Impaired/Different from baseline                                 General Comments: Pt unresponsive, pt with decererbrate posturing with stimulation         Exercises General Exercises - Upper Extremity Shoulder Flexion: PROM;Both;10 reps;Supine Shoulder Extension: PROM;Both;10 reps;Supine Elbow Flexion: PROM;Both;10 reps;Supine Elbow Extension: PROM;Both;10 reps;Supine Wrist Flexion: PROM;Both;10 reps;Supine Wrist Extension: PROM;Both;10 reps;Supine Digit Composite  Flexion: AROM;Both;10 reps;Supine Composite Extension: PROM;Both;10 reps;Supine Other Exercises Other Exercises: Splint applied to bil. hands.    Shoulder Instructions       General Comments      Pertinent Vitals/ Pain       Pain Assessment: Faces Faces Pain Scale: No hurt Pain Intervention(s): Monitored during session  Home Living                                          Prior Functioning/Environment              Frequency  Min 3X/week        Progress Toward Goals  OT Goals(current goals can now be found in the care plan section)  Progress towards OT goals: Progressing toward goals     Plan Discharge plan remains appropriate    Co-evaluation                 AM-PAC OT "6 Clicks" Daily Activity     Outcome Measure  Help from another person eating meals?: Total Help from another person taking care of personal grooming?: Total Help from another person toileting, which includes using toliet, bedpan, or urinal?: Total Help from another person bathing (including washing, rinsing, drying)?: Total Help from another person to put on and taking off regular upper body clothing?: Total Help from another person to put on and taking off regular lower body clothing?: Total 6 Click Score: 6    End of Session    OT Visit Diagnosis: Muscle weakness (generalized) (M62.81);Other symptoms and signs involving cognitive function   Activity Tolerance Patient limited by lethargy   Patient Left in bed   Nurse Communication          Time: [id number] OT Time Calculation (min): 15 min  Charges: OT General Charges $OT Visit: 1 Visit OT Treatments $Therapeutic Activity: 8-22 mins  Jeani HawkingWendi Raymie Giammarco, OTR/L Acute Rehabilitation Services Pager [phone] Office [phone]    [name], [name] 06/16/2018, 6:08 PM

## 2018-06-17 DIAGNOSIS — G931 Anoxic brain damage, not elsewhere classified: Secondary | ICD-10-CM

## 2018-06-17 DIAGNOSIS — E876 Hypokalemia: Secondary | ICD-10-CM

## 2018-06-17 LAB — GLUCOSE, CAPILLARY
Glucose-Capillary: 105 mg/dL — ABNORMAL HIGH (ref 70–99)
Glucose-Capillary: 109 mg/dL — ABNORMAL HIGH (ref 70–99)
Glucose-Capillary: 119 mg/dL — ABNORMAL HIGH (ref 70–99)
Glucose-Capillary: 121 mg/dL — ABNORMAL HIGH (ref 70–99)
Glucose-Capillary: 131 mg/dL — ABNORMAL HIGH (ref 70–99)
Glucose-Capillary: 64 mg/dL — ABNORMAL LOW (ref 70–99)

## 2018-06-17 LAB — TRIGLYCERIDES: Triglycerides: 47 mg/dL (ref <150)

## 2018-06-17 MED ORDER — DEXTROSE 50 % IV SOLN
INTRAVENOUS | Status: AC
  Administered 2018-06-17: 25 mL via INTRAVENOUS
  Filled 2018-06-17: qty 50

## 2018-06-17 MED ORDER — DEXTROSE 50 % IV SOLN
12.5000 g | INTRAVENOUS | Status: AC
  Administered 2018-06-17: 25 mL via INTRAVENOUS

## 2018-06-17 NOTE — Progress Notes (Signed)
PT Cancellation Note  Patient Details Name: Jesse [name] MRN: [id number] DOB: 30-Sep-1990   Cancelled Treatment:    Reason Eval/Treat Not Completed: Medical issues which prohibited therapy;Patient not medically ready Holding PT treatment today. Per RN, pt with neuro storming and not stable for activity at this time. Will follow.   [name] 06/17/2018, 10:26 AM Mylo Red, PT, DPT Acute Rehabilitation Services Pager [phone] Office [phone]

## 2018-06-17 NOTE — Progress Notes (Signed)
Neurology Progress Note   S:// No acute overnight events.  Vitals have remained relatively stable overnight.  No seizure-like activity noted. I was asked to speak with the patient's mother which I did after the examination. Please see my detailed note below   O:// Current vital signs: BP (!) 156/96   Pulse (!) 103   Temp 99 F (37.2 C) (Axillary)   Resp (!) 22   Ht 6' (1.829 m)   Wt 93.6 kg   SpO2 97%   BMI 27.99 kg/m  Vital signs in last 24 hours: Temp:  [99 F (37.2 C)-101.3 F (38.5 C)] 99 F (37.2 C) (04/24 0800) Pulse Rate:  [84-147] 103 (04/24 0900) Resp:  [16-35] 22 (04/24 0900) BP: (129-176)/(68-100) 156/96 (04/24 0900) SpO2:  [94 %-100 %] 97 % (04/24 0900) FiO2 (%):  [40 %-50 %] 40 % (04/24 0747) Weight:  [93.6 kg] 93.6 kg (04/24 0500) General: Sedated on propofol, intubated HEENT: Cephalic atraumatic CVs: J2-I7 heard regular rate rhythm Chest: Clear to auscultation Extremities: Warm well perfused Neurological exam Patient sedated intubated To loud voice, there was some eye opening- possibly purposeful. Remains nonverbal and does not follow any commands Cranial nerves: Pupils are equal round react light, extraocular movements difficult to assess but has preserved oculocephalic reflex, is breathing over the ventilator, facial symmetry difficult to ascertain.  Corneal reflexes present.  Cough and gag present. Motor exam: On noxious simulation to all 4 extremities has extensor posturing. Sensory exam: As above Coordination and gait cannot be tested   Medications  Current Facility-Administered Medications:  .  0.9 %  sodium chloride infusion, 250 mL, Intravenous, Continuous, Bowser, Grace E, NP, Last Rate: 10 mL/hr at 06/17/18 0900 .  0.9 %  sodium chloride infusion, , Intravenous, Continuous, Ollis, Brandi L, NP, Last Rate: 125 mL/hr at 06/17/18 0900 .  acetaminophen (TYLENOL) solution 650 mg, 650 mg, Per Tube, Q6H PRN, Brand Males, MD, 650 mg at  06/16/18 2007 .  albuterol (PROVENTIL) (2.5 MG/3ML) 0.083% nebulizer solution 2.5 mg, 2.5 mg, Nebulization, Q2H PRN, Bowser, Laurel Dimmer, NP .  Ampicillin-Sulbactam (UNASYN) 3 g in sodium chloride 0.9 % 100 mL IVPB, 3 g, Intravenous, Q6H, Skeet Simmer, RPH, Stopped at 06/17/18 0630 .  bisacodyl (DULCOLAX) suppository 10 mg, 10 mg, Rectal, Daily PRN, Bowser, Laurel Dimmer, NP .  chlorhexidine gluconate (MEDLINE KIT) (PERIDEX) 0.12 % solution 15 mL, 15 mL, Mouth Rinse, BID, Agarwala, Ravi, MD, 15 mL at 06/17/18 0828 .  docusate (COLACE) 50 MG/5ML liquid 100 mg, 100 mg, Per Tube, BID PRN, Bowser, Laurel Dimmer, NP .  feeding supplement (PRO-STAT SUGAR FREE 64) liquid 30 mL, 30 mL, Per Tube, Daily, Ollis, Brandi L, NP, 30 mL at 06/16/18 0947 .  feeding supplement (VITAL 1.5 CAL) liquid 1,000 mL, 1,000 mL, Per Tube, Continuous, Ramaswamy, Murali, MD, Last Rate: 65 mL/hr at 06/16/18 1100 .  fentaNYL (SUBLIMAZE) injection 50-100 mcg, 50-100 mcg, Intravenous, Q30 min PRN, Ollis, Brandi L, NP, 100 mcg at 06/17/18 0342 .  levETIRAcetam (KEPPRA) IVPB 500 mg/100 mL premix, 500 mg, Intravenous, Q12H, Amie Portland, MD, Stopped at 06/16/18 2202 .  MEDLINE mouth rinse, 15 mL, Mouth Rinse, 10 times per day, Kipp Brood, MD, 15 mL at 06/17/18 0605 .  midazolam (VERSED) injection 1 mg, 1 mg, Intravenous, Q2H PRN, Ollis, Brandi L, NP .  ondansetron (ZOFRAN) injection 4 mg, 4 mg, Intravenous, Q6H PRN, Bowser, Grace E, NP .  pantoprazole sodium (PROTONIX) 40 mg/20 mL oral suspension 40 mg, 40  mg, Per Tube, Daily, Agarwala, Ravi, MD, 40 mg at 06/16/18 2147 .  [COMPLETED] propofol (DIPRIVAN) bolus via infusion 89.8 mg, 1 mg/kg, Intravenous, Once, 89.8 mg at 06/15/18 1133 **AND** propofol (DIPRIVAN) 1000 MG/100ML infusion, 0-20 mcg/kg/min, Intravenous, Continuous, Amie Portland, MD, Last Rate: 8.08 mL/hr at 06/17/18 0900, 15 mcg/kg/min at 06/17/18 0900 Labs CBC    Component Value Date/Time   WBC 15.4 (H) 06/16/2018 0239   RBC  3.88 (L) 06/16/2018 0239   HGB 12.0 (L) 06/16/2018 0239   HCT 35.1 (L) 06/16/2018 0239   PLT 136 (L) 06/16/2018 0239   MCV 90.5 06/16/2018 0239   MCH 30.9 06/16/2018 0239   MCHC 34.2 06/16/2018 0239   RDW 12.6 06/16/2018 0239   LYMPHSABS 0.9 06/13/2018 1609   MONOABS 3.9 (H) 06/13/2018 1609   EOSABS 0.0 06/13/2018 1609   BASOSABS 0.0 06/13/2018 1609    CMP     Component Value Date/Time   NA 142 06/16/2018 0239   K 3.6 06/16/2018 0239   CL 110 06/16/2018 0239   CO2 24 06/16/2018 0239   GLUCOSE 151 (H) 06/16/2018 0239   BUN 10 06/16/2018 0239   CREATININE 0.73 06/16/2018 0239   CALCIUM 8.1 (L) 06/16/2018 0239   PROT 5.2 (L) 06/16/2018 0239   ALBUMIN 2.4 (L) 06/16/2018 0239   AST 280 (H) 06/16/2018 0239   ALT 518 (H) 06/16/2018 0239   ALKPHOS 51 06/16/2018 0239   BILITOT 0.8 06/16/2018 0239   GFRNONAA >60 06/16/2018 0239   GFRAA >60 06/16/2018 0239     Imaging I have reviewed images in epic and the results pertinent to this consultation are: No new imaging or MRIs, no new EEG is reviewed. Please see prior detailed review of imaging and prior notes  Assessment: 28 year old man with no significant past medical history other than that of drug abuse and alcohol abuse found down in his car in a parking lot with unknown downtime in a running car and required intubation for airway protection and depressed mental status. There was concern for seizure-like activity due to posturing. Imaging concerning for hypoxic/anoxic injury as well as prior monoxide toxicity.  I spoke to his mother in detail, she provided some more history.  He is adopted and his biological parents have a history of addictions.  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  Mother was surprised to learn that he had cocaine in his system. 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  He  was in drug rehabilitation 4 years ago and had been clean at least 3-1/2 years or so prior to relapsing again.  He had spent the Saturday prior to presentation with his mother at her house, and she said he appeared in good spirits and normal. She also notified me that he has a living will that clearly states that he does not want to be kept alive on machines if he would be in vegetative state of any sort.   Impression: Toxic metabolic encephalopathy -secondary to drug overdose as well as possible bilateral watershed infarcts. 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  Recommendations: -Continue Keppra for now -Supportive treatment per perimary team as you as you are. Leukocytosis improving. -I would recommend following him with serial neurological examinations and maintaining a normal internal  body milieu. If his examination does not show any meaningful changes and he does not have any purposeful movement or response in his exam by about 7 days from his presentation, at that time the prognostication likely would be that he would not have a good chance of a meaningful neurological recovery and might also going to a persistent vegetative state, but that will have to be ascertained over a time period. I relayed my plan in detail to the mother.  Answered all the questions that she had to the best of my ability over a detailed call that I had with her. Patient's RN Hocutt was also appraised of the plan and questions answered. I will continue to follow this patient with you.  -- Amie Portland, MD [facility] Pager: [phone] If 7pm to 7am, please call on call as listed on AMION.  CRITICAL CARE ATTESTATION Performed by: Amie Portland, MD Total critical care time: 55 minutes Critical care time was exclusive of separately  billable procedures and treating other patients and/or supervising APPs/Residents/Students Critical care was necessary to treat or prevent imminent or life-threatening deterioration due to Toxic metabolic encephalopathy -secondary to drug overdose as well as possible bilateral watershed infarcts, Hypoxic ischemic encephalopathy- secondary to drug overdose as well as possible carbon monoxide toxicity and Concern for seizure. This patient is critically ill and at significant risk for neurological worsening and/or death and care requires constant monitoring. Critical care was time spent personally by me on the following activities: development of treatment plan with patient and/or surrogate as well as nursing, discussions with consultants, evaluation of patient's response to treatment, examination of patient, obtaining history from patient or surrogate, ordering and performing treatments and interventions, ordering and review of laboratory studies, ordering and review of radiographic studies, pulse oximetry, re-evaluation of patient's condition, participation in multidisciplinary rounds and medical decision making of high complexity in the care of this patient.

## 2018-06-17 NOTE — Progress Notes (Signed)
NAME:  Jesse [name], MRN:  [id number], DOB:  09/02/1990, LOS: 4 ADMISSION DATE:  06/13/2018, CONSULTATION DATE: June 13, 2018 REFERRING MD: Dr. Pilar PlateBero EDP, CHIEF COMPLAINT: Altered mental status  Brief History   28 year old male found down in car with suspected drug overdose versus status epilepticus.  Intubated in the emergency department.  CT Head with concern by Neurology for bilateral basal ganglia hypodensities. Admitted to ICU on vent.    Past Medical History  Substance abuse Prior suicide attempt  Significant Hospital Events   4/20 Admit with AMS, intubated.   Consults:  Neurology 4/20 >  Procedures:  ETT 4/20 >  Significant Diagnostic Tests:  CT head 4/20 > official read as non-acute. Neurology feels there are bilateral basal ganglia hypodensities.   Micro Data:  Blood 4/20 > Trach aspirate 4/20 > HIV 4/20 > non reactive  Antimicrobials:  Unasyn 4/21 >>day 3  Interim history/subjective:  No events overnight, remains unresponsive  Objective   Blood pressure (!) 143/88, pulse (!) 107, temperature 99 F (37.2 C), temperature source Axillary, resp. rate (!) 22, height 6' (1.829 m), weight 93.6 kg, SpO2 97 %.    Vent Mode: PSV;CPAP FiO2 (%):  [40 %-50 %] 40 % Set Rate:  [16 bmp] 16 bmp Vt Set:  [620 mL] 620 mL PEEP:  [5 cmH20] 5 cmH20 Pressure Support:  [8 cmH20] 8 cmH20 Plateau Pressure:  [15 cmH20-18 cmH20] 16 cmH20   Intake/Output Summary (Last 24 hours) at 06/17/2018 1036 Last data filed at 06/17/2018 1000 Gross per 24 hour  Intake 5942.23 ml  Output 1925 ml  Net 4017.23 ml   Filed Weights   06/15/18 0500 06/16/18 0500 06/17/18 0500  Weight: 89.8 kg 91 kg 93.6 kg    Examination: General: Acutely ill appearing male, NAD, sedate Neuro: Comatose, not following commands HEENT: [place]/AT, PERRL, EOM-I and MMM Heart: RRR, Nl S1/S2 and -M/R/G Lung: CTA bilaterally Abdomen: Soft, NT, ND and +BS Ext: -edema and -tenderness Skin: intact  Resolved  Hospital Problem list   Circulatory shock   Assessment & Plan:   Acute encephalopathy - Continue keppra - EEG per neuro - UDS on admit positive for amphetamines, cocaine, benzodiazepines - Supportive care - Minimize sedation as able, d/c propofol, PRN fentanyl and versed - Neurology following  Acute respiratory sufficiency - Begin PS trials but no extubation given mental status - Spoke with father, he questioned what the plan is, will let neurology lead the prognostication and decide based on that - No extubation today - Continue ventilator support  Right lower lobe aspiration pneumonia - Unasyn for aspiration pneumonia  - Follow radiological changes  SIRS -leukocytosis and hypothermia on admit, suspected RLL aspiration PNA  P: Follow cultures Trend CBC  Acute kidney injury - Secondary to hypovolemia, rhabdomyolysis - Continue IV fluids at 125 cc an hour - Follow CK-improving - Trend BMP - Avoid nephrotoxic's  Shock liver - Trend LFTs-improving  At Risk Malnutrition  P: TF per Nutrition   Pending prognostication will determine if comfort vs trach/peg, will follow neuro's lead  Father updated over the phone and all questions answered  Best practice:  Diet: Tube feeds Pain/Anxiety/Delirium protocol (if indicated): PRN sedation VAP protocol (if indicated): Per protocol DVT prophylaxis: SCDs GI prophylaxis: Protonix Glucose control: N/a Mobility: BR Code Status: Full Family Communication: Will call parents 4/23.  Mother updated 4/22 via phone.  Disposition: ICU  Labs   CBC: Recent Labs  Lab 06/13/18 1609 06/13/18 1811 06/14/18 0408 06/15/18 0143  06/16/18 0239  WBC 29.9*  --  16.8* 21.1* 15.4*  NEUTROABS 24.2*  --   --   --   --   HGB 14.3 11.2* 14.8 14.6 12.0*  HCT 43.9 33.0* 42.1 41.8 35.1*  MCV 94.4  --  88.4 89.7 90.5  PLT 264  --  226 163 136*    Basic Metabolic Panel: Recent Labs  Lab 06/13/18 1609 06/13/18 1811 06/13/18 2148 06/14/18  0408 06/15/18 0143 06/16/18 0239  NA 142 142  --  141 140 142  K 5.5* 4.5  --  4.0 3.6 3.6  CL 106  --   --  107 105 110  CO2 22  --   --  24 25 24   GLUCOSE 124*  --   --  100* 119* 151*  BUN 18  --   --  17 15 10   CREATININE 1.81*  --   --  1.09 0.86 0.73  CALCIUM 9.2  --   --  9.0 8.6* 8.1*  MG  --   --  1.8 1.5*  --   --   PHOS  --   --  4.8* 3.5  --   --    GFR: Estimated Creatinine Clearance: 167.7 mL/min (by C-G formula based on SCr of 0.73 mg/dL). Recent Labs  Lab 06/13/18 1609 06/13/18 2148 06/14/18 0408 06/15/18 0143 06/16/18 0239  PROCALCITON  --  21.28 24.42 11.91  --   WBC 29.9*  --  16.8* 21.1* 15.4*  LATICACIDVEN 7.2* 4.2*  --   --   --     Liver Function Tests: Recent Labs  Lab 06/13/18 1609 06/14/18 0408 06/15/18 0143 06/16/18 0239  AST 940* 1,516* 569* 280*  ALT 1,201* 1,382* 940* 518*  ALKPHOS 66 63 59 51  BILITOT 0.4 0.8 0.9 0.8  PROT 7.0 6.2* 5.7* 5.2*  ALBUMIN 4.0 3.6 2.8* 2.4*   ABG    Component Value Date/Time   PHART 7.352 06/13/2018 1811   PCO2ART 39.7 06/13/2018 1811   PO2ART 261.0 (H) 06/13/2018 1811   HCO3 22.0 06/13/2018 1811   TCO2 23 06/13/2018 1811   ACIDBASEDEF 3.0 (H) 06/13/2018 1811   O2SAT 100.0 06/13/2018 1811     Coagulation Profile: Recent Labs  Lab 06/13/18 1609  INR 1.2    Cardiac Enzymes: Recent Labs  Lab 06/13/18 1609 06/14/18 0408 06/15/18 0143 06/15/18 1920  CKTOTAL 511* 13,371* 7,844* 5,159*    HbA1C: No results found for: HGBA1C  CBG: Recent Labs  Lab 06/16/18 1550 06/16/18 1932 06/16/18 2321 06/17/18 0321 06/17/18 0732  GLUCAP 112* 97 98 121* 105*   The patient is critically ill with multiple organ systems failure and requires high complexity decision making for assessment and support, frequent evaluation and titration of therapies, application of advanced monitoring technologies and extensive interpretation of multiple databases.   Critical Care Time devoted to patient care services  described in this note is  32  Minutes. This time reflects time of care of this signee Dr Koren Bound. This critical care time does not reflect procedure time, or teaching time or supervisory time of PA/NP/Med student/Med Resident etc but could involve care discussion time.  Alyson Reedy, M.D. [facility] Pulmonary/Critical Care Medicine. Pager: [phone]. After hours pager: [phone].

## 2018-06-17 NOTE — Progress Notes (Signed)
Spoke with patient's mom on the phone this am with updates. She requested to speak with a neurologist. Dr. Wilford Corner is on the phone with her now answering all questions. We have an [place] video chat set up for 1300 today. Elisabella Hacker, [place] Scrape, RN

## 2018-06-17 NOTE — TOC Initial Note (Signed)
Transition of Care [facility]) - Initial/Assessment Note    Patient Details  Name: Jesse [name] MRN: [id number] Date of Birth: 11/24/1990  Transition of [facility]) CM/SW Contact:    Glennon Mac, RN Phone Number: 06/17/2018, 4:04 PM  Clinical Narrative:   Pt admitted on 06/13/2018 after being found down unresponsive in his vehicle.  Pt with suspected drug overdose versus epilepticus.  He was intubated in the ED for airway protection.  PTA, pt independent and living with his girlfriend.  Parents have been calling to get updates on patient condition.  Currently pt remains intubated and unresponsive.  Will continue to follow as pt progresses.                    Barriers to Discharge: Continued Medical Work up          Expected Discharge Plan and Services     Discharge Planning Services: CM Consult   Living arrangements for the past 2 months: Single Family Home                                      Prior Living Arrangements/Services Living arrangements for the past 2 months: Single Family Home Lives with:: Significant Other(Girlfriend) Patient language and need for interpreter reviewed:: Yes        Need for Family Participation in Patient Care: Yes (Comment) Care giver support system in place?: No (comment)   Criminal Activity/Legal Involvement Pertinent to Current Situation/Hospitalization: No - Comment as needed  Activities of [name] Living Home Assistive Devices/Equipment: None ADL Screening (condition at time of admission) Patient's cognitive ability adequate to safely complete [name] activities?: No Is the patient deaf or have difficulty hearing?: No Does the patient have difficulty seeing, even when wearing glasses/contacts?: No Does the patient have difficulty concentrating, remembering, or making decisions?: Yes Patient able to express need for assistance with ADLs?: No Does the patient have difficulty dressing or bathing?: Yes Independently performs  ADLs?: No Does the patient have difficulty walking or climbing stairs?: Yes Weakness of Legs: Both Weakness of Arms/Hands: Both                   Emotional Assessment Appearance:: Appears stated age Attitude/Demeanor/Rapport: Intubated (Following Commands or Not Following Commands) Affect (typically observed): Unable to Assess   Alcohol / Substance Use: Illicit Drugs    Admission diagnosis:  Respiratory failure (HCC) [J96.90] Shock (HCC) [R57.9] Drug overdose, undetermined intent, initial encounter [T50.904A] Altered mental status, unspecified altered mental status type [R41.82] Patient Active Problem List   Diagnosis Date Noted  . Acute respiratory failure with hypoxia (HCC)   . Altered mental status 06/13/2018   PCP:  No primary care provider on file. Pharmacy:   [facility] - [place], [place] - [address] [place] [address] Phone: [phone] Fax: [phone]  [id number] - [place], [place] - [address] AT [facility] [address] [place] [address] Phone: [phone] Fax: [phone]         Readmission Risk Interventions No flowsheet data found.   Quintella Baton, RN, BSN  Trauma/Neuro ICU Case Manager [phone]

## 2018-06-17 NOTE — Progress Notes (Signed)
Assisted tele visit to patient with family member.  Unique Searfoss Anderson, RN   

## 2018-06-17 NOTE — Progress Notes (Addendum)
Initial [facility] referral completed due to GCS 4  Referral # [id number], spoke with Merlene Pulling [place] returned phone call for further details. CDS will follow patient status via phone. Please call with any change in patient condition, brain death testing or plan for withdrawal.

## 2018-06-17 NOTE — Progress Notes (Signed)
RT note: patient placed on CPAP/PSV of 8/5 at 0747.  Currently tolerating well.  Will continue to monitor.

## 2018-06-17 NOTE — Progress Notes (Signed)
Assisted tele visit to patient with family member.  Inna Tisdell William, RN   

## 2018-06-18 ENCOUNTER — Inpatient Hospital Stay (HOSPITAL_COMMUNITY): Payer: BLUE CROSS/BLUE SHIELD

## 2018-06-18 LAB — GLUCOSE, CAPILLARY
Glucose-Capillary: 107 mg/dL — ABNORMAL HIGH (ref 70–99)
Glucose-Capillary: 112 mg/dL — ABNORMAL HIGH (ref 70–99)
Glucose-Capillary: 115 mg/dL — ABNORMAL HIGH (ref 70–99)
Glucose-Capillary: 125 mg/dL — ABNORMAL HIGH (ref 70–99)
Glucose-Capillary: 126 mg/dL — ABNORMAL HIGH (ref 70–99)
Glucose-Capillary: 128 mg/dL — ABNORMAL HIGH (ref 70–99)
Glucose-Capillary: 133 mg/dL — ABNORMAL HIGH (ref 70–99)

## 2018-06-18 LAB — BASIC METABOLIC PANEL
Anion gap: 8 (ref 5–15)
BUN: 9 mg/dL (ref 6–20)
CO2: 25 mmol/L (ref 22–32)
Calcium: 8.6 mg/dL — ABNORMAL LOW (ref 8.9–10.3)
Chloride: 110 mmol/L (ref 98–111)
Creatinine, Ser: 0.51 mg/dL — ABNORMAL LOW (ref 0.61–1.24)
GFR calc Af Amer: >60 mL/min (ref >60)
GFR calc non Af Amer: >60 mL/min (ref >60)
Glucose, Bld: 126 mg/dL — ABNORMAL HIGH (ref 70–99)
Potassium: 3.3 mmol/L — ABNORMAL LOW (ref 3.5–5.1)
Sodium: 143 mmol/L (ref 135–145)

## 2018-06-18 LAB — CBC
HCT: 36.1 % — ABNORMAL LOW (ref 39.0–52.0)
Hemoglobin: 12.4 g/dL — ABNORMAL LOW (ref 13.0–17.0)
MCH: 31.2 pg (ref 26.0–34.0)
MCHC: 34.3 g/dL (ref 30.0–36.0)
MCV: 90.7 fL (ref 80.0–100.0)
Platelets: 194 10*3/uL (ref 150–400)
RBC: 3.98 MIL/uL — ABNORMAL LOW (ref 4.22–5.81)
RDW: 12.3 % (ref 11.5–15.5)
WBC: 9.1 10*3/uL (ref 4.0–10.5)
nRBC: 0 % (ref 0.0–0.2)

## 2018-06-18 LAB — BLOOD GAS, ARTERIAL
Acid-Base Excess: 3 mmol/L — ABNORMAL HIGH (ref 0.0–2.0)
Bicarbonate: 27.1 mmol/L (ref 20.0–28.0)
Drawn by: 330991
FIO2: 50
MECHVT: 620 mL
O2 Saturation: 99.3 %
PEEP: 5 cmH2O
Patient temperature: 99.1
RATE: 16 resp/min
pCO2 arterial: 42.7 mmHg (ref 32.0–48.0)
pH, Arterial: 7.42 (ref 7.350–7.450)
pO2, Arterial: 173 mmHg — ABNORMAL HIGH (ref 83.0–108.0)

## 2018-06-18 LAB — CULTURE, BLOOD (ROUTINE X 2)
Culture: NO GROWTH
Culture: NO GROWTH
Special Requests: ADEQUATE

## 2018-06-18 LAB — PHOSPHORUS: Phosphorus: 4.2 mg/dL (ref 2.5–4.6)

## 2018-06-18 LAB — MAGNESIUM: Magnesium: 2 mg/dL (ref 1.7–2.4)

## 2018-06-18 MED ORDER — POTASSIUM CHLORIDE 20 MEQ/15ML (10%) PO SOLN
40.0000 meq | ORAL | Status: AC
  Administered 2018-06-18 (×2): 40 meq
  Filled 2018-06-18 (×2): qty 30

## 2018-06-18 MED ORDER — BETHANECHOL CHLORIDE 10 MG PO TABS
10.0000 mg | ORAL_TABLET | Freq: Three times a day (TID) | ORAL | Status: DC
  Administered 2018-06-18 – 2018-06-20 (×7): 10 mg via ORAL
  Filled 2018-06-18 (×6): qty 1

## 2018-06-18 NOTE — Progress Notes (Signed)
Assisted tele visit to patient with mother.  Jesse Howerton Ann, RN  

## 2018-06-18 NOTE — Progress Notes (Signed)
NAME:  Jesse [name], MRN:  [id number], DOB:  June 20, 1990, LOS: 5 ADMISSION DATE:  06/13/2018, CONSULTATION DATE: June 13, 2018 REFERRING MD: Dr. Pilar Plate EDP, CHIEF COMPLAINT: Altered mental status  Brief History   28 year old male found down in car with suspected drug overdose versus status epilepticus.  Intubated in the emergency department.  CT Head with concern by Neurology for bilateral basal ganglia hypodensities. Admitted to ICU on vent.    Past Medical History  Substance abuse Prior suicide attempt  Significant Hospital Events   4/20 Admit with AMS, intubated.   Consults:  Neurology 4/20 >  Procedures:  ETT 4/20 >  Significant Diagnostic Tests:  CT head 4/20 > official read as non-acute. Neurology feels there are bilateral basal ganglia hypodensities.   Micro Data:  Blood 4/20 > Trach aspirate 4/25 > HIV 4/20 > non reactive Covid 19 4/20>> Negative Urine 4/10<< No growth  Antimicrobials:  Unasyn 4/21 >>day 4  Interim history/subjective:  No events overnight, remains unresponsive Neuro storm with tachypnea with weaning attempts  Objective   Blood pressure (!) 160/85, pulse 73, temperature 99.7 F (37.6 C), temperature source Axillary, resp. rate 14, height 6' (1.829 m), weight 94 kg, SpO2 98 %.    Vent Mode: PRVC FiO2 (%):  [40 %-50 %] 40 % Set Rate:  [16 bmp] 16 bmp Vt Set:  [620 mL] 620 mL PEEP:  [5 cmH20] 5 cmH20 Plateau Pressure:  [11 cmH20-22 cmH20] 16 cmH20   Intake/Output Summary (Last 24 hours) at 06/18/2018 0950 Last data filed at 06/18/2018 0900 Gross per 24 hour  Intake 4782.48 ml  Output 2525 ml  Net 2257.48 ml   Filed Weights   06/16/18 0500 06/17/18 0500 06/18/18 0600  Weight: 91 kg 93.6 kg 94 kg    Examination: General: Acutely ill appearing male, NAD, minimally responsive, full vent support Neuro: Comatose, not following commands, ? Movement R arm 4/25 HEENT: [place]/AT, PERRL, EOM-I and MMM, ETT secure and intact, OG tube Heart:  S1/S2 , RRR, no M/R/G Lung: Bilateral chest excursion, Coarse rhonchi bilaterally Abdomen: Soft, NT, ND and +BS Ext: -edema and -tenderness, moon boots in place, brisk capillary refill Skin:warm and dry,  intact  Resolved Hospital Problem list   Circulatory shock   Assessment & Plan:   Acute encephalopathy - Continue keppra - EEG per neuro - UDS on admit positive for amphetamines, cocaine, benzodiazepines - Supportive care - Minimize sedation as able, d/c propofol, PRN fentanyl and versed - Neurology following  Acute respiratory sufficiency Net + 15 L - Begin PS trials but no extubation given mental status - neurology to  lead the prognostication and decide plan/ goals of care based on that - Failed wean 4/25 am >> will try again this afternoon - Continue ventilator support  Right lower lobe aspiration pneumonia CXR 4/25>>RIGHT lower lobe pneumonia / RIGHT basilar atelectasis slightly improved  - Unasyn for aspiration pneumonia  - Follow radiological changes - Trend fever and WBC - Send sputum culture - Follow Micro, narrow  ABX coverage as able - Will check PCT 4/26 - Albuterol prn ( ? Remote smoking history)  SIRS -leukocytosis and hypothermia on admit, suspected RLL aspiration PNA  P: Follow cultures Trend CBC Trend CXR  Acute kidney injury>> creatinine has normalized - Secondary to hypovolemia, rhabdomyolysis - Continue IV fluids at 125 cc an hour for now - Trend CK>> if improving decrease maintenance IVF 4/26 - Trend BMP/ CMET - Avoid nephrotoxic medications - Maintain renal perfussion  Shock liver - Trend LFTs-improving  Urinary Retention Foley re-inserted 4/22 Plan Start Bethanechol 10 mg TID 4/25 DC foley 4/26 Bladder scan Q 4 x 3 after foley removed to ensure resolution of retention  At Risk Malnutrition  P: TF per Nutrition   Pending prognostication will determine if comfort vs trach/peg, will follow neuro's lead  Will re-check CK 4/26>>  if improved consider decreasing maintenance IVF  Best practice:  Diet: Tube feeds Pain/Anxiety/Delirium protocol (if indicated): PRN sedation VAP protocol (if indicated): Per protocol DVT prophylaxis: SCDs GI prophylaxis: Protonix Glucose control: N/a Mobility: BR Code Status: Full Family Communication: Will call parents 4/23.  Mother updated 4/22 via phone.  Disposition: ICU  Labs   CBC: Recent Labs  Lab 06/13/18 1609 06/13/18 1811 06/14/18 0408 06/15/18 0143 06/16/18 0239 06/18/18 0400  WBC 29.9*  --  16.8* 21.1* 15.4* 9.1  NEUTROABS 24.2*  --   --   --   --   --   HGB 14.3 11.2* 14.8 14.6 12.0* 12.4*  HCT 43.9 33.0* 42.1 41.8 35.1* 36.1*  MCV 94.4  --  88.4 89.7 90.5 90.7  PLT 264  --  226 163 136* 194    Basic Metabolic Panel: Recent Labs  Lab 06/13/18 1609 06/13/18 1811 06/13/18 2148 06/14/18 0408 06/15/18 0143 06/16/18 0239 06/18/18 0400  NA 142 142  --  141 140 142 143  K 5.5* 4.5  --  4.0 3.6 3.6 3.3*  CL 106  --   --  107 105 110 110  CO2 22  --   --  24 25 24 25   GLUCOSE 124*  --   --  100* 119* 151* 126*  BUN 18  --   --  17 15 10 9   CREATININE 1.81*  --   --  1.09 0.86 0.73 0.51*  CALCIUM 9.2  --   --  9.0 8.6* 8.1* 8.6*  MG  --   --  1.8 1.5*  --   --  2.0  PHOS  --   --  4.8* 3.5  --   --  4.2   GFR: Estimated Creatinine Clearance: 168.1 mL/min (A) (by C-G formula based on SCr of 0.51 mg/dL (L)). Recent Labs  Lab 06/13/18 1609 06/13/18 2148 06/14/18 0408 06/15/18 0143 06/16/18 0239 06/18/18 0400  PROCALCITON  --  21.28 24.42 11.91  --   --   WBC 29.9*  --  16.8* 21.1* 15.4* 9.1  LATICACIDVEN 7.2* 4.2*  --   --   --   --     Liver Function Tests: Recent Labs  Lab 06/13/18 1609 06/14/18 0408 06/15/18 0143 06/16/18 0239  AST 940* 1,516* 569* 280*  ALT 1,201* 1,382* 940* 518*  ALKPHOS 66 63 59 51  BILITOT 0.4 0.8 0.9 0.8  PROT 7.0 6.2* 5.7* 5.2*  ALBUMIN 4.0 3.6 2.8* 2.4*   ABG    Component Value Date/Time   PHART 7.420  06/18/2018 0455   PCO2ART 42.7 06/18/2018 0455   PO2ART 173 (H) 06/18/2018 0455   HCO3 27.1 06/18/2018 0455   TCO2 23 06/13/2018 1811   ACIDBASEDEF 3.0 (H) 06/13/2018 1811   O2SAT 99.3 06/18/2018 0455     Coagulation Profile: Recent Labs  Lab 06/13/18 1609  INR 1.2    Cardiac Enzymes: Recent Labs  Lab 06/13/18 1609 06/14/18 0408 06/15/18 0143 06/15/18 1920  CKTOTAL 511* 13,371* 7,844* 5,159*    HbA1C: No results found for: HGBA1C  CBG: Recent Labs  Lab 06/17/18 2016 06/17/18  2331 06/18/18 0002 06/18/18 0339 06/18/18 0811  GLUCAP 119* 64* 133* 112* 128*    Bevelyn Ngo, AGACNP-BC [facility] Pulmonary/Critical Care Medicine. Pager: [phone] After hours pager: [phone]. 06/18/2018 9:51 AM

## 2018-06-18 NOTE — Progress Notes (Addendum)
Occupational Therapy Progress Note  Splints continue to fit well and pt is tolerating them for for hour periods of time.  Pt continues to posture with noxious stimuli.  PROM performed    06/17/18 1500  OT Visit Information  Last OT Received On 06/17/18  Assistance Needed +2  History of Present Illness 28 year old male found down in car with suspected drug overdose versus status epilepticus. Intubated 4/20. MRI showing multiple areas of restricted diffusion, predominantly in the cerebellar hemispheres which might be representative of hypoxic/anoxic injury but specifically in bilateral globus pallidus and lentiform nuclei on the right. No significant medical past history but history of alcohol and drug abuse.   Precautions  Precautions Fall;Other (comment)  Pain Assessment  Faces Pain Scale 0  Cognition  Arousal/Alertness Lethargic  Overall Cognitive Status Impaired/Different from baseline  General Comments Pt unresponsive.  His eyes with flutter partially open, but not to command.   Continues to decererbrate posture in response to noxious stimulation   Difficult to assess due to Level of arousal  Upper Extremity Assessment  RUE Deficits / Details Pt with decererbrateposturing, but able to achieve full PROM bil. UEs   RUE Coordination decreased gross motor;decreased fine motor  LUE Deficits / Details Pt with decererbrate posturing, but able to achieve full PROM bil. UEs  LUE Coordination decreased fine motor;decreased gross motor  General Exercises - Upper Extremity  Shoulder Flexion PROM;Both;10 reps;Supine  Shoulder Extension PROM;Both;10 reps;Supine  Elbow Flexion PROM;Both;10 reps;Supine  Elbow Extension PROM;Both;10 reps;Supine  Wrist Flexion PROM;Both;10 reps;Supine  Wrist Extension PROM;Both;10 reps;Supine  Digit Composite Flexion AROM;Both;10 reps;Supine  Composite Extension PROM;Both;10 reps;Supine  Other Exercises  Other Exercises splints applied.  Spoke with RN and no  problems/issues from splints   OT - End of Session  Activity Tolerance Patient limited by lethargy  Patient left in bed  Nurse Communication Mobility status;Other (comment)  OT Assessment/Plan  OT Plan Discharge plan remains appropriate  OT Visit Diagnosis Muscle weakness (generalized) (M62.81);Other symptoms and signs involving cognitive function  OT Frequency (ACUTE ONLY) Min 3X/week  Follow Up Recommendations Other (comment)  OT Equipment None recommended by OT  AM-PAC OT "6 Clicks" Daily Activity Outcome Measure (Version 2)  Help from another person eating meals? 1  Help from another person taking care of personal grooming? 1  Help from another person toileting, which includes using toliet, bedpan, or urinal? 1  Help from another person bathing (including washing, rinsing, drying)? 1  Help from another person to put on and taking off regular upper body clothing? 1  Help from another person to put on and taking off regular lower body clothing? 1  6 Click Score 6  OT Goal Progression  Progress towards OT goals Not progressing toward goals - comment (poorly responsive )  OT Time Calculation  OT Start Time (ACUTE ONLY) 1525  OT Stop Time (ACUTE ONLY) 1538  OT Time Calculation (min) 13 min  OT General Charges  $OT Visit 1 Visit  OT Treatments  $Therapeutic Activity 8-22 mins  [name], OTR/L Acute Rehabilitation Services Pager [phone] Office [phone]

## 2018-06-18 NOTE — Progress Notes (Addendum)
Neurology Progress Note   S:// No acute overnight events.  Vitals have remained relatively stable.  No seizure-like activity noted. There continues to be abnormal posturing, but some purposeful movements now seen in right arm towards tube.    O:// Current vital signs: BP (!) 160/85   Pulse 73   Temp 99.7 F (37.6 C) (Axillary)   Resp 14   Ht 6' (1.829 m)   Wt 94 kg   SpO2 98%   BMI 28.11 kg/m  Vital signs in last 24 hours: Temp:  [98.9 F (37.2 C)-99.7 F (37.6 C)] 99.7 F (37.6 C) (04/25 0800) Pulse Rate:  [68-120] 73 (04/25 0900) Resp:  [14-30] 14 (04/25 0900) BP: (139-164)/(70-99) 160/85 (04/25 0900) SpO2:  [84 %-100 %] 98 % (04/25 0900) FiO2 (%):  [40 %-50 %] 40 % (04/25 0800) Weight:  [94 kg] 94 kg (04/25 0600) General: Sedated on propofol, intubated HEENT: Cephalic atraumatic CVs: K5-G2 heard regular rate rhythm Chest: Clear to auscultation Extremities: Warm well perfused Neurological exam Patient sedated intubated To loud voice, there was some eye opening- possibly purposeful. Remains nonverbal and does not follow any commands Cranial nerves: Pupils are equal round react light, extraocular movements difficult to assess but has preserved oculocephalic reflex, is breathing over the ventilator, facial symmetry difficult to ascertain around ETT.  Corneal reflexes present.  Cough and gag present. Motor exam: To any tactile simulation, all 4 extremities move- this is mostly extensor-type posturing, but there was repeat purpouseful right arm movement towards tube seen today.  Sensory exam: As above Coordination and gait cannot be tested   Medications  Current Facility-Administered Medications:  .  0.9 %  sodium chloride infusion, 250 mL, Intravenous, Continuous, Bowser, Laurel Dimmer, NP, Stopped at 06/18/18 [phone] .  0.9 %  sodium chloride infusion, , Intravenous, Continuous, Ollis, Brandi L, NP, Last Rate: 125 mL/hr at 06/18/18 0900 .  acetaminophen (TYLENOL) solution 650  mg, 650 mg, Per Tube, Q6H PRN, Brand Males, MD, 650 mg at 06/17/18 2349 .  albuterol (PROVENTIL) (2.5 MG/3ML) 0.083% nebulizer solution 2.5 mg, 2.5 mg, Nebulization, Q2H PRN, Bowser, Laurel Dimmer, NP .  Ampicillin-Sulbactam (UNASYN) 3 g in sodium chloride 0.9 % 100 mL IVPB, 3 g, Intravenous, Q6H, Skeet Simmer, RPH, Stopped at 06/18/18 0710 .  bisacodyl (DULCOLAX) suppository 10 mg, 10 mg, Rectal, Daily PRN, Bowser, Laurel Dimmer, NP .  chlorhexidine gluconate (MEDLINE KIT) (PERIDEX) 0.12 % solution 15 mL, 15 mL, Mouth Rinse, BID, Agarwala, Ravi, MD, 15 mL at 06/18/18 0843 .  docusate (COLACE) 50 MG/5ML liquid 100 mg, 100 mg, Per Tube, BID PRN, Bowser, Laurel Dimmer, NP .  feeding supplement (PRO-STAT SUGAR FREE 64) liquid 30 mL, 30 mL, Per Tube, Daily, Ollis, Brandi L, NP, 30 mL at 06/17/18 1024 .  feeding supplement (VITAL 1.5 CAL) liquid 1,000 mL, 1,000 mL, Per Tube, Continuous, Ramaswamy, Murali, MD, Last Rate: 65 mL/hr at 06/18/18 0610, 1,000 mL at 06/18/18 0610 .  fentaNYL (SUBLIMAZE) injection 50-100 mcg, 50-100 mcg, Intravenous, Q30 min PRN, Ollis, Brandi L, NP, 100 mcg at 06/18/18 0853 .  levETIRAcetam (KEPPRA) IVPB 500 mg/100 mL premix, 500 mg, Intravenous, Q12H, Amie Portland, MD, Stopped at 06/17/18 2119 .  MEDLINE mouth rinse, 15 mL, Mouth Rinse, 10 times per day, Kipp Brood, MD, 15 mL at 06/18/18 0635 .  midazolam (VERSED) injection 1 mg, 1 mg, Intravenous, Q2H PRN, Ollis, Brandi L, NP, 1 mg at 06/18/18 0853 .  ondansetron (ZOFRAN) injection 4 mg, 4 mg, Intravenous,  Q6H PRN, Cristal Generous, NP .  pantoprazole sodium (PROTONIX) 40 mg/20 mL oral suspension 40 mg, 40 mg, Per Tube, Daily, Agarwala, Ravi, MD, 40 mg at 06/17/18 2101 Labs CBC    Component Value Date/Time   WBC 9.1 06/18/2018 0400   RBC 3.98 (L) 06/18/2018 0400   HGB 12.4 (L) 06/18/2018 0400   HCT 36.1 (L) 06/18/2018 0400   PLT 194 06/18/2018 0400   MCV 90.7 06/18/2018 0400   MCH 31.2 06/18/2018 0400   MCHC 34.3 06/18/2018  0400   RDW 12.3 06/18/2018 0400   LYMPHSABS 0.9 06/13/2018 1609   MONOABS 3.9 (H) 06/13/2018 1609   EOSABS 0.0 06/13/2018 1609   BASOSABS 0.0 06/13/2018 1609    CMP     Component Value Date/Time   NA 143 06/18/2018 0400   K 3.3 (L) 06/18/2018 0400   CL 110 06/18/2018 0400   CO2 25 06/18/2018 0400   GLUCOSE 126 (H) 06/18/2018 0400   BUN 9 06/18/2018 0400   CREATININE 0.51 (L) 06/18/2018 0400   CALCIUM 8.6 (L) 06/18/2018 0400   PROT 5.2 (L) 06/16/2018 0239   ALBUMIN 2.4 (L) 06/16/2018 0239   AST 280 (H) 06/16/2018 0239   ALT 518 (H) 06/16/2018 0239   ALKPHOS 51 06/16/2018 0239   BILITOT 0.8 06/16/2018 0239   GFRNONAA >60 06/18/2018 0400   GFRAA >60 06/18/2018 0400     Imaging I have reviewed images in epic and the results pertinent to this consultation are: No new imaging or MRIs, no new EEG is reviewed. Please see prior detailed review of imaging and prior notes  Assessment: 28 year old man with no significant past medical history other than that of drug abuse and alcohol abuse found down in his car in a parking lot with unknown downtime in a running car and required intubation for airway protection and depressed mental status. There was concern for seizure-like activity due to posturing.Imaging concerning for hypoxic/anoxic injury as well as carbon monoxide toxicity.  Further history was provided by mother on 4/24: He is adopted and his biological parents have a history of addictions.  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  Mother was surprised to learn that he had cocaine in his system. 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  He was in drug rehabilitation 4 years ago and had been clean ~3-1/2 years prior to relapsing again.  He had spent the Saturday prior to presentation with his mother at her house, and she said he appeared in good  spirits and normal. She also notified me that he has a living will that clearly states that he does not want to be kept alive on machines if he would be in vegetative state of any sort.   Impression: Toxic metabolic encephalopathy -secondary to drug overdose as well as possible bilateral watershed infarcts. 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  Recommendations: -Continue Keppra for now -Supportive treatment per perimary team as you as you are -I would recommend following him with serial neurological examinations and maintaining a normal internal body milieu. -Prognosis is currently unclear. He may need more time to see where his mentation will end up. While he had a trace of meaningful movement in right arm today, he is still at risk  for M&M. If his examination does not continue to show further meaningful changes in the coming days, by about 7-10 days from his presentation, at that time prognostication and quality of life could be better known. Given time, he may have a chance of a meaningful neurological recovery.  - We will continue to follow this patient with you.  Desiree Metzger-Cihelka, ARNP-C, ANVP-BC [facility] Pager: [phone] If 7pm to 7am, please call on call as listed on AMION.   Attending Neurohospitalist Addendum Patient seen and examined with APP/Resident. Agree with the history and physical as documented above. Agree with the plan as documented, which I helped formulate. I have independently reviewed the chart, obtained history, review of systems and examined the patient.I have personally reviewed pertinent head/neck/spine imaging (CT/MRI). Please feel free to call with any questions. --- Amie Portland, MD [facility] Pager: [phone]  If 7pm to 7am, please call on call as  listed on AMION.

## 2018-06-18 NOTE — Progress Notes (Signed)
eLink Physician-Brief Progress Note Patient Name: Jesse [name] DOB: 1990/11/06 MRN: [id number]   Date of Service  06/18/2018  HPI/Events of Note  Hypokalemia  eICU Interventions  Potassium replaced     Intervention Category Intermediate Interventions: Electrolyte abnormality - evaluation and management  Hersel Mcmeen 06/18/2018, 5:11 AM

## 2018-06-18 NOTE — Progress Notes (Signed)
Video call with Girlfriend, Revonda Standard [phone]

## 2018-06-19 ENCOUNTER — Inpatient Hospital Stay (HOSPITAL_COMMUNITY): Payer: BLUE CROSS/BLUE SHIELD

## 2018-06-19 ENCOUNTER — Other Ambulatory Visit: Payer: Self-pay

## 2018-06-19 LAB — CBC
HCT: 33.1 % — ABNORMAL LOW (ref 39.0–52.0)
Hemoglobin: 11.6 g/dL — ABNORMAL LOW (ref 13.0–17.0)
MCH: 31.7 pg (ref 26.0–34.0)
MCHC: 35 g/dL (ref 30.0–36.0)
MCV: 90.4 fL (ref 80.0–100.0)
Platelets: 217 10*3/uL (ref 150–400)
RBC: 3.66 MIL/uL — ABNORMAL LOW (ref 4.22–5.81)
RDW: 12.3 % (ref 11.5–15.5)
WBC: 8.9 10*3/uL (ref 4.0–10.5)
nRBC: 0 % (ref 0.0–0.2)

## 2018-06-19 LAB — GLUCOSE, CAPILLARY
Glucose-Capillary: 108 mg/dL — ABNORMAL HIGH (ref 70–99)
Glucose-Capillary: 109 mg/dL — ABNORMAL HIGH (ref 70–99)
Glucose-Capillary: 106 mg/dL — ABNORMAL HIGH (ref 70–99)
Glucose-Capillary: 109 mg/dL — ABNORMAL HIGH (ref 70–99)
Glucose-Capillary: 113 mg/dL — ABNORMAL HIGH (ref 70–99)
Glucose-Capillary: 100 mg/dL — ABNORMAL HIGH (ref 70–99)

## 2018-06-19 LAB — COMPREHENSIVE METABOLIC PANEL
ALT: 197 U/L — ABNORMAL HIGH (ref 0–44)
AST: 84 U/L — ABNORMAL HIGH (ref 15–41)
Albumin: 2.4 g/dL — ABNORMAL LOW (ref 3.5–5.0)
Alkaline Phosphatase: 48 U/L (ref 38–126)
Anion gap: 10 (ref 5–15)
BUN: 12 mg/dL (ref 6–20)
CO2: 27 mmol/L (ref 22–32)
Calcium: 8.9 mg/dL (ref 8.9–10.3)
Chloride: 108 mmol/L (ref 98–111)
Creatinine, Ser: 0.61 mg/dL (ref 0.61–1.24)
GFR calc Af Amer: >60 mL/min (ref >60)
GFR calc non Af Amer: >60 mL/min (ref >60)
Glucose, Bld: 106 mg/dL — ABNORMAL HIGH (ref 70–99)
Potassium: 3.4 mmol/L — ABNORMAL LOW (ref 3.5–5.1)
Sodium: 145 mmol/L (ref 135–145)
Total Bilirubin: 0.5 mg/dL (ref 0.3–1.2)
Total Protein: 5.5 g/dL — ABNORMAL LOW (ref 6.5–8.1)

## 2018-06-19 LAB — PROCALCITONIN: Procalcitonin: 0.93 ng/mL

## 2018-06-19 LAB — MAGNESIUM: Magnesium: 1.8 mg/dL (ref 1.7–2.4)

## 2018-06-19 LAB — CK: Total CK: 733 U/L — ABNORMAL HIGH (ref 49–397)

## 2018-06-19 MED ORDER — DEXTROSE-NACL 5-0.45 % IV SOLN
INTRAVENOUS | Status: DC
  Administered 2018-06-19 – 2018-06-20 (×3): via INTRAVENOUS

## 2018-06-19 MED ORDER — PROPRANOLOL HCL 20 MG/5ML PO SOLN
20.0000 mg | Freq: Every day | ORAL | Status: DC
  Administered 2018-06-19 – 2018-07-04 (×15): 20 mg
  Filled 2018-06-19 (×18): qty 5
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
## 2018-06-19 NOTE — Progress Notes (Signed)
Assisted tele visit to patient with girlfriend Revonda Standard.  Burman Riis, RN
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
## 2018-06-19 NOTE — Progress Notes (Signed)
Assisted tele visit to patient with mother.  Higinio Grow Ann, RN  

## 2018-06-20 LAB — GLUCOSE, CAPILLARY
Glucose-Capillary: 118 mg/dL — ABNORMAL HIGH (ref 70–99)
Glucose-Capillary: 102 mg/dL — ABNORMAL HIGH (ref 70–99)
Glucose-Capillary: 87 mg/dL (ref 70–99)
Glucose-Capillary: 92 mg/dL (ref 70–99)
Glucose-Capillary: 96 mg/dL (ref 70–99)
Glucose-Capillary: 103 mg/dL — ABNORMAL HIGH (ref 70–99)

## 2018-06-20 LAB — CULTURE, RESPIRATORY W GRAM STAIN
Culture: NORMAL
Special Requests: NORMAL

## 2018-06-20 MED ORDER — POTASSIUM CHLORIDE 20 MEQ/15ML (10%) PO SOLN
40.0000 meq | Freq: Once | ORAL | Status: AC
  Administered 2018-06-20: 12:00:00 40 meq
  Filled 2018-06-20: qty 30

## 2018-06-20 MED ORDER — BETHANECHOL CHLORIDE 10 MG PO TABS
10.0000 mg | ORAL_TABLET | Freq: Three times a day (TID) | ORAL | Status: DC
  Administered 2018-06-20 – 2018-07-14 (×68): 10 mg
  Filled 2018-06-20 (×69): qty 1

## 2018-06-20 MED ORDER — LEVETIRACETAM 100 MG/ML PO SOLN
500.0000 mg | Freq: Two times a day (BID) | ORAL | Status: DC
  Administered 2018-06-20 – 2018-06-23 (×6): 500 mg
  Filled 2018-06-20 (×6): qty 5

## 2018-06-20 NOTE — Progress Notes (Signed)
Updated pt's mother and father by phone.  His mother feels he had significant improvement over the past 48 hours >> she reports he would blink on command when they did face time into the room.  Will continue to monitor neuro status.  Discussed that he might need trach and long term support if neuro status doesn't improve further.  Coralyn Helling, MD [facility] 06/20/2018, 1:14 PM
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
## 2018-06-20 NOTE — Progress Notes (Signed)
Assisted pt's girlfriend with camera/video time via Elink

## 2018-06-20 NOTE — Progress Notes (Signed)
Assisted family to speak and see pt via elink

## 2018-06-20 NOTE — Progress Notes (Signed)
PULMONARY / CRITICAL CARE MEDICINE   NAME:  Jesse [name], MRN:  [id number], DOB:  04-06-1990, LOS: 7 ADMISSION DATE:  06/13/2018, CONSULTATION DATE:  06/13/2018 REFERRING MD:  Pilar PlateBero, (ED), CHIEF COMPLAINT:  Altered MS  BRIEF HISTORY:    28 yo male found in car unresponsive.  Intubated for airway protection.  Neuro imaging shows changes of diffuse anoxic injury.  UDS positive for cocaine, benzo's, amphetamines.  SIGNIFICANT PAST MEDICAL HISTORY   Substance abuse, Depression with prior suicide attempt  SIGNIFICANT EVENTS:  4/20 Admit with AMS, intubated  STUDIES:   MRI brain 4/20 >> multifocal diffusion abnormality within gray nuclei, b/l supratentorial white matter and b/l cerebellar hemispheres Echo 4/21 >> EF 55 to 60% EEG 4/22 >> background slowing  CULTURES:  Blood 4/20 > negative Trach aspirate 4/25 > negative HIV 4/20 > non reactive Covid 19 4/20 > Negative Urine 4/10 > No growth  ANTIBIOTICS:  Unasyn 4/21 >>  LINES/TUBES:  ETT 4/20 >  CONSULTANTS:  Neurology  SUBJECTIVE:  Remains on vent.  CONSTITUTIONAL: BP 127/65   Pulse 70   Temp 98.3 F (36.8 C) (Axillary)   Resp 14   Ht 6' (1.829 m)   Wt 93.9 kg   SpO2 97%   BMI 28.08 kg/m   I/O last 3 completed shifts: In: 6864.9 [I.V.:3617.6; NG/GT:2340; IV Piggyback:907.3] Out: 3075 [Urine:3075]     Vent Mode: PSV;CPAP FiO2 (%):  [40 %] 40 % Set Rate:  [16 bmp] 16 bmp Vt Set:  [782[620 mL] 620 mL PEEP:  [5 cmH20] 5 cmH20 Pressure Support:  [8 cmH20] 8 cmH20 Plateau Pressure:  [13 cmH20-15 cmH20] 15 cmH20  PHYSICAL EXAM:  General - on vent Eyes - pupils reactive ENT - ETT in place Cardiac - regular rate/rhythm, no murmur Chest - equal breath sounds b/l, no wheezing or rales Abdomen - soft, non tender, + bowel sounds Extremities - no cyanosis, clubbing, or edema Skin - no rashes Neuro - opens eyes with stimulation, doesn't follow commands  RESOLVED PROBLEM LIST  Acute renal failure with ATN,  Elevated LFT's from Shock, Septic shock  ASSESSMENT AND PLAN    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 Plan - monitor neuro status - continue AEDs per neuro - propranolol per neuro for possible TBI  Acute respiratory failure with hypoxia and inability to protect airway. Plan - will need to d/w family goals of care - mental status precludes extubation trial  Aspiration pneumonia. Plan - day 7/7 of ABx  Urine retention. Hypokalemia. Plan - replace potassium - continue urecholine  Best Practice / Goals of Care / Disposition.   DVT PROPHYLAXIS: Lovenox SUP: protonix NUTRITION: tube feeds MOBILITY: bed rest GOALS OF CARE: full code DISPOSITION: ICU  LABS   CMP Latest Ref Rng & Units 06/19/2018 06/18/2018 06/16/2018  Glucose 70 - 99 mg/dL 956(O106(H) 130(Q126(H) 657(Q151(H)  BUN 6 - 20 mg/dL 12 9 10   Creatinine 0.61 - 1.24 mg/dL 4.690.61 6.29(B0.51(L) 2.840.73  Sodium 135 - 145 mmol/L 145 143 142  Potassium 3.5 - 5.1 mmol/L 3.4(L) 3.3(L) 3.6  Chloride 98 - 111 mmol/L 108 110 110  CO2 22 - 32 mmol/L 27 25 24   Calcium 8.9 - 10.3 mg/dL 8.9 1.3(K8.6(L) 8.1(L)  Total Protein 6.5 - 8.1 g/dL 4.4(W5.5(L) - 5.2(L)  Total Bilirubin 0.3 - 1.2 mg/dL 0.5 - 0.8  Alkaline Phos 38 - 126 U/L 48 - 51  AST 15 - 41 U/L 84(H) - 280(H)  ALT 0 - 44 U/L 197(H) - 518(H)  CBC Latest Ref Rng & Units 06/19/2018 06/18/2018 06/16/2018  WBC 4.0 - 10.5 K/uL 8.9 9.1 15.4(H)  Hemoglobin 13.0 - 17.0 g/dL 11.6(L) 12.4(L) 12.0(L)  Hematocrit 39.0 - 52.0 % 33.1(L) 36.1(L) 35.1(L)  Platelets 150 - 400 K/uL 217 194 136(L)   CBG (last 3)  Recent Labs    06/19/18 2319 06/20/18 0322 06/20/18 0753  GLUCAP 100* 118* 102*   ABG    Component Value Date/Time   PHART 7.420 06/18/2018 0455   PCO2ART 42.7 06/18/2018 0455   PO2ART 173 (H) 06/18/2018 0455   HCO3 27.1 06/18/2018 0455   TCO2 23 06/13/2018 1811   ACIDBASEDEF 3.0 (H) 06/13/2018 1811   O2SAT 99.3 06/18/2018 0455    CC time 32 minutes  Coralyn Helling, MD [facility]  Pulmonary/Critical Care 06/20/2018, 10:43 AM

## 2018-06-21 LAB — GLUCOSE, CAPILLARY
Glucose-Capillary: 98 mg/dL (ref 70–99)
Glucose-Capillary: 105 mg/dL — ABNORMAL HIGH (ref 70–99)
Glucose-Capillary: 92 mg/dL (ref 70–99)
Glucose-Capillary: 100 mg/dL — ABNORMAL HIGH (ref 70–99)
Glucose-Capillary: 91 mg/dL (ref 70–99)
Glucose-Capillary: 104 mg/dL — ABNORMAL HIGH (ref 70–99)

## 2018-06-21 LAB — BASIC METABOLIC PANEL
Anion gap: 10 (ref 5–15)
BUN: 17 mg/dL (ref 6–20)
CO2: 27 mmol/L (ref 22–32)
Calcium: 9.1 mg/dL (ref 8.9–10.3)
Chloride: 104 mmol/L (ref 98–111)
Creatinine, Ser: 0.63 mg/dL (ref 0.61–1.24)
GFR calc Af Amer: >60 mL/min (ref >60)
GFR calc non Af Amer: >60 mL/min (ref >60)
Glucose, Bld: 119 mg/dL — ABNORMAL HIGH (ref 70–99)
Potassium: 3.7 mmol/L (ref 3.5–5.1)
Sodium: 141 mmol/L (ref 135–145)

## 2018-06-21 LAB — MAGNESIUM: Magnesium: 2.1 mg/dL (ref 1.7–2.4)

## 2018-06-21 MED ORDER — VECURONIUM BROMIDE 10 MG IV SOLR
10.0000 mg | Freq: Once | INTRAVENOUS | Status: DC
  Filled 2018-06-21: qty 10

## 2018-06-21 MED ORDER — PROPOFOL 10 MG/ML IV BOLUS: 500.0000 mg | Freq: Once | INTRAVENOUS | Status: DC

## 2018-06-21 MED ORDER — MIDAZOLAM HCL 2 MG/2ML IJ SOLN
5.0000 mg | Freq: Once | INTRAMUSCULAR | Status: DC
  Filled 2018-06-21: qty 6

## 2018-06-21 MED ORDER — FENTANYL CITRATE (PF) 100 MCG/2ML IJ SOLN
200.0000 ug | Freq: Once | INTRAMUSCULAR | Status: DC
  Filled 2018-06-21: qty 4

## 2018-06-21 MED ORDER — ETOMIDATE 2 MG/ML IV SOLN
40.0000 mg | Freq: Once | INTRAVENOUS | Status: DC
  Filled 2018-06-21: qty 20

## 2018-06-21 NOTE — Progress Notes (Signed)
PULMONARY / CRITICAL CARE MEDICINE   NAME:  Jesse [name], MRN:  [id number], DOB:  01/23/1991, LOS: 8 ADMISSION DATE:  06/13/2018, CONSULTATION DATE:  06/13/2018 REFERRING MD:  Pilar Plate, (ED), CHIEF COMPLAINT:  Altered MS  BRIEF HISTORY:    28 yo male found in car unresponsive.  Intubated for airway protection.  Neuro imaging shows changes of diffuse anoxic injury.  UDS positive for cocaine, benzo's, amphetamines.  SIGNIFICANT PAST MEDICAL HISTORY   Substance abuse, Depression with prior suicide attempt  SIGNIFICANT EVENTS:  4/20 Admit with AMS, intubated  STUDIES:   MRI brain 4/20 >> multifocal diffusion abnormality within gray nuclei, b/l supratentorial white matter and b/l cerebellar hemispheres Echo 4/21 >> EF 55 to 60% EEG 4/22 >> background slowing  CULTURES:  Blood 4/20 > negative Trach aspirate 4/25 > negative HIV 4/20 > non reactive Covid 19 4/20 > Negative Urine 4/10 > No growth  ANTIBIOTICS:  Unasyn 4/21 >>  LINES/TUBES:  ETT 4/20 >  CONSULTANTS:  Neurology  SUBJECTIVE:  No sig change. Intermittent appearance of "neuro-storm" but then also at times seems possibly purposeful on right   CONSTITUTIONAL: Blood Pressure 130/71   Pulse 77   Temperature 99.3 F (37.4 C) (Axillary)   Respiration 16   Height 6' (1.829 m)   Weight 88.3 kg   Oxygen Saturation 98%   Body Mass Index 26.40 kg/m   I/O last 3 completed shifts: In: 5040.6 [I.V.:1890.5; NG/GT:2450; IV Piggyback:700.1] Out: 3550 [Urine:3550]     Vent Mode: PSV;CPAP FiO2 (%):  [40 %] 40 % Set Rate:  [16 bmp] 16 bmp Vt Set:  [620 mL] 620 mL PEEP:  [5 cmH20] 5 cmH20 Pressure Support:  [5 cmH20] 5 cmH20 Plateau Pressure:  [13 cmH20-19 cmH20] 19 cmH20  PHYSICAL EXAM: General: This is a well-developed 28 year old male patient who remains ventilatory dependent following anoxic brain injury HEENT normocephalic atraumatic no jugular venous distention mucous membranes are moist Neuro: Eyes are open,  not tracking.  Intermittent decerebrate posturing.  Has had intermittent episodes where he seemed purposeful on the right.  Bilateral foot drop appreciated. Pulmonary: Coarse scattered rhonchi equal chest rise no accessory use on pressure support of 5 Cardiac: Regular rate and rhythm without murmur rub or gallop Abdomen: Soft nontender no organomegaly tolerating tube feeds GU: Clear yellow Extremities: Warm dry brisk cap refill.  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 Plan Continuing AEDs per neurology Continuing propranolol per neuro for possible TBI Serial neuro checks  Acute respiratory failure with hypoxia and inability to protect airway. Plan Continuing full ventilator support VAP bundle Intermittent chest x-rays (will check in am 4/29 to f/u recent rx for PNA) Will discuss with his parents he is now day #8 on ventilator, given his age he still has potential to recover some, think we should proceed with early tracheostomy  Intermittent fluid and electrolyte imbalance  Plan Intermittent chemistries  Urine retention. Plan Cont Urecholine     Best Practice / Goals of Care / Disposition.   DVT PROPHYLAXIS: Lovenox SUP: protonix NUTRITION: tube feeds MOBILITY: bed rest GOALS OF CARE: full code DISPOSITION: ICU Mains critically ill due to need for mechanical ventilation.  Will need to discuss tracheostomy with family and orchestrate this logistically with our team.  We will reach out to his parents later today  My ccm time 32 minutes  [place] Pager # [phone] OR # [phone] if no answer

## 2018-06-21 NOTE — Progress Notes (Signed)
Assisted tele visit to patient with mother.  Jesse [name] M [name], RN   

## 2018-06-21 NOTE — Progress Notes (Signed)
Assisted tele visit to patient with father.  Jesse [name] M Jesse Moffa, RN   

## 2018-06-21 NOTE — Progress Notes (Signed)
I called Jesse [name] his mother.  We discussed current findings. Plan of care... Will plan on trach Thursday  Simonne Martinet ACNP-BC [facility] Pulmonary/Critical Care Pager # [phone] OR # [phone] if no answer

## 2018-06-21 NOTE — Plan of Care (Signed)
  Problem: Clinical Measurements: Goal: Respiratory complications will improve Outcome: Progressing Goal: Cardiovascular complication will be avoided Outcome: Progressing   Problem: Activity: Goal: Risk for activity intolerance will decrease Outcome: Progressing   Problem: Elimination: Goal: Will not experience complications related to urinary retention Outcome: Progressing   

## 2018-06-21 NOTE — Progress Notes (Signed)
Nutrition Follow-up RD working remotely.  DOCUMENTATION CODES:   Not applicable  INTERVENTION:   Continue:  Vital 1.5 @ 65 ml/hr via OG tube 30 ml Prostat daily  Provides: 2440 kcal, 120 grams protein, and 1191 ml free water.    NUTRITION DIAGNOSIS:   Inadequate oral intake related to inability to eat as evidenced by NPO status. Ongoing.   GOAL:   Patient will meet greater than or equal to 90% of their needs Met.   MONITOR:   Vent status, TF tolerance  REASON FOR ASSESSMENT:   Consult, Ventilator Enteral/tube feeding initiation and management  ASSESSMENT:   Pt with PMH of substance abuse and prior suicide attempt admitted 4/20 with suspected drug overdose (UDS positive on admission for amphetamines, cocaine, and benzos), suspected RLL aspiration PNA, and AKI after being found down in his car outside of a hotel.    Neuro recommends trach/PEG.   Patient is currently intubated on ventilator support MV: 15.2 L/min Temp (24hrs), Avg:99 F (37.2 C), Min:97.7 F (36.5 C), Max:99.9 F (37.7 C) Pt is 18 L positive   Medications and labs reviewed    NUTRITION - FOCUSED PHYSICAL EXAM:  Deferred   Diet Order:   Diet Order            Diet NPO time specified  Diet effective now              EDUCATION NEEDS:   No education needs have been identified at this time  Skin:  Skin Assessment: Reviewed RN Assessment  Last BM:  4/27 ? smear  Height:   Ht Readings from Last 1 Encounters:  06/13/18 6' (1.829 m)    Weight:   Wt Readings from Last 1 Encounters:  06/21/18 88.3 kg    Ideal Body Weight:  80.9 kg  BMI:  Body mass index is 26.4 kg/m.  Estimated Nutritional Needs:   Kcal:  2550  Protein:  110-120 grams  Fluid:  > 2 L/day  Maylon Peppers RD, LDN, CNSC [phone] Pager [phone] After Hours Pager

## 2018-06-21 NOTE — Progress Notes (Signed)
Video call completed with fiance, Revonda Standard. Pt alert and eyes open, looking at camera but not participating.

## 2018-06-21 NOTE — Progress Notes (Signed)
Attending:  Treyven Lafauci   Subjective: No overnight events Intermittent appearance of neuro storm  Objective: Vitals:   06/21/18 1000 06/21/18 1100 06/21/18 1139 06/21/18 1200  BP: (!) 132/58 (!) 146/85 (!) 146/85 (!) 158/92  Pulse: 74 98  98  Resp: 18 (!) 23 (!) 22 17  Temp:    (!) 100.6 F (38.1 C)  TempSrc:    Axillary  SpO2: 97% 99% 99% 99%  Weight:      Height:       Vent Mode: PRVC FiO2 (%):  [40 %] 40 % Set Rate:  [16 bmp] 16 bmp Vt Set:  [620 mL] 620 mL PEEP:  [5 cmH20] 5 cmH20 Pressure Support:  [5 cmH20] 5 cmH20 Plateau Pressure:  [13 cmH20-22 cmH20] 22 cmH20  Intake/Output Summary (Last 24 hours) at 06/21/2018 1416 Last data filed at 06/21/2018 1200 Gross per 24 hour  Intake 1385 ml  Output 2200 ml  Net -815 ml    Young gentleman, does not appear to be in distress Moist oral mucosa Eyes are open, not tracking Rhonchi bilaterally S1-S2 appreciated  CBC    Component Value Date/Time   WBC 8.9 06/19/2018 0440   RBC 3.66 (L) 06/19/2018 0440   HGB 11.6 (L) 06/19/2018 0440   HCT 33.1 (L) 06/19/2018 0440   PLT 217 06/19/2018 0440   MCV 90.4 06/19/2018 0440   MCH 31.7 06/19/2018 0440   MCHC 35.0 06/19/2018 0440   RDW 12.3 06/19/2018 0440   LYMPHSABS 0.9 06/13/2018 1609   MONOABS 3.9 (H) 06/13/2018 1609   EOSABS 0.0 06/13/2018 1609   BASOSABS 0.0 06/13/2018 1609    BMET    Component Value Date/Time   NA 141 06/21/2018 0224   K 3.7 06/21/2018 0224   CL 104 06/21/2018 0224   CO2 27 06/21/2018 0224   GLUCOSE 119 (H) 06/21/2018 0224   BUN 17 06/21/2018 0224   CREATININE 0.63 06/21/2018 0224   CALCIUM 9.1 06/21/2018 0224   GFRNONAA >60 06/21/2018 0224   GFRAA >60 06/21/2018 0224    CXR images reviewed  Impression/Plan: Acute anoxic encephalopathy from drug overdose Continue AEDs per neurology Propranolol per neuro for possible TBI Serial neurochecks  Acute respiratory failure with inability to protect airway Discussions with family Plan is for  tracheostomy on Thursday  Urinary retention Continue Urecholine  Discussed with Zenia Resides, NP Continue lines of care at present Plan for tracheostomy CCM time spent evaluating patient, reviewing records, formulating plan of care 32 minutes  Virl Diamond, MD [place] PCCM Cell: [phone]

## 2018-06-22 ENCOUNTER — Inpatient Hospital Stay (HOSPITAL_COMMUNITY): Payer: BLUE CROSS/BLUE SHIELD

## 2018-06-22 DIAGNOSIS — R402431 Glasgow coma scale score 3-8, in the field [EMT or ambulance]: Secondary | ICD-10-CM

## 2018-06-22 DIAGNOSIS — Z9911 Dependence on respirator [ventilator] status: Secondary | ICD-10-CM

## 2018-06-22 LAB — GLUCOSE, CAPILLARY
Glucose-Capillary: 103 mg/dL — ABNORMAL HIGH (ref 70–99)
Glucose-Capillary: 105 mg/dL — ABNORMAL HIGH (ref 70–99)
Glucose-Capillary: 125 mg/dL — ABNORMAL HIGH (ref 70–99)
Glucose-Capillary: 129 mg/dL — ABNORMAL HIGH (ref 70–99)
Glucose-Capillary: 95 mg/dL (ref 70–99)
Glucose-Capillary: 98 mg/dL (ref 70–99)

## 2018-06-22 LAB — CBC
HCT: 38.4 % — ABNORMAL LOW (ref 39.0–52.0)
Hemoglobin: 13.3 g/dL (ref 13.0–17.0)
MCH: 30.8 pg (ref 26.0–34.0)
MCHC: 34.6 g/dL (ref 30.0–36.0)
MCV: 88.9 fL (ref 80.0–100.0)
Platelets: 384 10*3/uL (ref 150–400)
RBC: 4.32 MIL/uL (ref 4.22–5.81)
RDW: 12.3 % (ref 11.5–15.5)
WBC: 14.5 10*3/uL — ABNORMAL HIGH (ref 4.0–10.5)
nRBC: 0 % (ref 0.0–0.2)

## 2018-06-22 NOTE — Progress Notes (Signed)
Please place order for vital go lift bed for progression of therapy. Joycelyn Rua, PTA Acute Rehabilitation Services Pager [phone] Office [phone]

## 2018-06-22 NOTE — Progress Notes (Signed)
Assisted tele visit to patient with mother.  Ardeth Repetto M Preciliano Castell, RN   

## 2018-06-22 NOTE — Care Management (Signed)
Noted CCM consult for possible [facility] Rehab:  At this time, feel pt is not appropriate for intensive neuro-rehab, as he is not actively participating in therapies, and has continued posturing.  Would need to see increased participation with therapies prior to making referral to neuro-rehab center.  Noted pt having tracheostomy place on 06/23/2018; may consider LTAC consult post-procedure, with eventual inpatient rehab should pt progress.    Quintella Baton, RN, BSN  Trauma/Neuro ICU Case Manager [phone]

## 2018-06-22 NOTE — Progress Notes (Addendum)
PULMONARY / CRITICAL CARE MEDICINE   NAME:  Jesse [name], MRN:  [id number], DOB:  [birth year], LOS: 9 ADMISSION DATE:  06/13/2018, CONSULTATION DATE:  06/13/2018 REFERRING MD:  Pilar Plate, (ED), CHIEF COMPLAINT:  Altered MS  BRIEF HISTORY:    28 yo male found in car unresponsive.  Intubated for airway protection.  Neuro imaging shows changes of diffuse anoxic injury.  UDS positive for cocaine, benzo's, amphetamines.  SIGNIFICANT PAST MEDICAL HISTORY   Substance abuse, Depression with prior suicide attempt  SIGNIFICANT EVENTS:  4/20 Admit with AMS, intubated  STUDIES:   MRI brain 4/20 >> multifocal diffusion abnormality within gray nuclei, b/l supratentorial white matter and b/l cerebellar hemispheres Echo 4/21 >> EF 55 to 60% EEG 4/22 >> background slowing  CULTURES:  Blood 4/20 > negative Trach aspirate 4/25 > negative HIV 4/20 > non reactive Covid 19 4/20 > Negative Urine 4/10 > No growth  ANTIBIOTICS:  Unasyn 4/21 >> completed 7 days  LINES/TUBES:  ETT 4/20 >  CONSULTANTS:  Neurology  SUBJECTIVE:  No significant changes  CONSTITUTIONAL: Blood Pressure 131/71   Pulse 85   Temperature (Abnormal) 100.7 F (38.2 C) (Axillary)   Respiration 16   Height 6' (1.829 m)   Weight 84 kg   Oxygen Saturation 96%   Body Mass Index 25.12 kg/m   I/O last 3 completed shifts: In: 2440 [NG/GT:2340; IV Piggyback:100] Out: 3150 [Urine:3150]     Vent Mode: CPAP;PSV FiO2 (%):  [40 %] 40 % Set Rate:  [16 bmp] 16 bmp Vt Set:  [151 mL] 620 mL PEEP:  [5 cmH20] 5 cmH20 Pressure Support:  [5 cmH20-8 cmH20] 5 cmH20 Plateau Pressure:  [13 cmH20-22 cmH20] 13 cmH20  PHYSICAL EXAM: Neuro: Well-developed 28 year old male patient remains ventilatory dependent following anoxic brain injury HEENT normocephalic atraumatic no jugular venous distention mucous membranes moist orally intubated Pulmonary: Clear to auscultation excellent tidal volume on pressure support ventilation 5 Cardiac:  Regular rate and rhythm without murmur rub or gallop Abdomen: Soft nontender no organomegaly tolerating tube feeds Extremities: Warm and dry dependent edema is appreciated Neuro: Eyes open.  Not tracking.  Intermittent decerebrate posturing.  Episodes of what appears to be occasional neuro storming.   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  There is also question of possible TBI Plan Continue AEDs  Continue propranolol for possible neuro storm  Serial neuro checks  Will need extensive neurological rehabilitation   Acute respiratory failure with hypoxia and inability to protect airway. Plan Continuing ventilatory support  VAP bundle  Plan for tracheostomy 4/30; he should come off ventilator quickly   Mild leukocytosis with low-grade fever. No new chest x-ray changes, actually aeration improved Plan Trend fever curve  intermittent fluid and electrolyte imbalance  Plan Intermittent chemistries  Urine retention. Plan Continue Urecholine   Best Practice / Goals of Care / Disposition.   DVT PROPHYLAXIS: Lovenox SUP: protonix NUTRITION: tube feeds MOBILITY: bed rest GOALS OF CARE: full code DISPOSITION: ICU Needs trach   My ccm time 32 minutes   [facility] Pulmonary/Critical Care Pager # [phone] OR # [phone] if no answer  PCCM ATTENDING ATTESTATION:  I have evaluated patient with the APP [place], reviewed database in its entirety and discussed care plan in detail. In addition, this patient was discussed on multidisciplinary rounds. I have personally reviewed all  chest radiographs discussed herein including CXRs and CT chest unless otherwise indicated. He is comfortable on PS 5 cm H2O. Except for comatose status, his exam is essentially normal. Poor cognition prohibits extubation. He  would likely benefit from trach tube placement. This has been scheduled  I agree with the above documented findings, assessment and plan  Billy Fischeravid Chrystina Naff, MD PCCM service Mobile [phone] Pager [phone] 06/23/2018 8:39 AM

## 2018-06-22 NOTE — Progress Notes (Addendum)
Occupational Therapy Treatment Patient Details Name: Jesse [name] MRN: [id number] DOB: 08-Apr-1990 Today's Date: 06/22/2018    History of present illness 28 year old male found down in car with suspected drug overdose versus status epilepticus. Intubated 4/20. MRI showing multiple areas of restricted diffusion, predominantly in the cerebellar hemispheres which might be representative of hypoxic/anoxic injury but specifically in bilateral globus pallidus and lentiform nuclei on the right. No significant medical past history but history of alcohol and drug abuse.    OT comments  Patient continues to requires 2+ total assist for bed mobility and ADLs at this time.  Sustained sitting EOB with max assist +2 statically, intermittent posturing and extension synergy while EOB and demonstrates minimal head/neck control as able to extend neck to hold head up for short bouts ~5-10 seconds with multimodal cueing. BP 130/84 before activity and 149/84 at completion of session. Not following commands during session, but able to scan towards R side when cued to look at his "hockey puck" x 2. RN reports pt tolerating splints well. Will follow.     Follow Up Recommendations  Other (comment)(TBD)    Equipment Recommendations  Other (comment)(TBD)    Recommendations for Other Services Other (comment)(palliative care )    Precautions / Restrictions Precautions Precautions: Fall Precaution Comments: neuro-storming, lots of secretions, vent, posturing Restrictions Weight Bearing Restrictions: No       Mobility Bed Mobility Overal bed mobility: Needs Assistance Bed Mobility: Supine to Sit;Sit to Supine;Rolling Rolling: Total assist;+2 for physical assistance   Supine to sit: Total assist;+2 for physical assistance Sit to supine: Total assist;+2 for physical assistance   General bed mobility comments: pt requires total assist for all aspects of bed mobility at this time, assisted to EOB with no  active particiaption.  intermittently posturing into extensor tone, heavy posterior lean; able to maintain EOB with max assist +2.  Initally unable to hold head up, but noted some initation and activation of holding head up for short bouts.   Transfers                 General transfer comment: deferred, unsafe at this time    Balance Overall balance assessment: Needs assistance Sitting-balance support: Feet supported;Bilateral upper extremity supported Sitting balance-Leahy Scale: Zero Sitting balance - Comments: pt dependent on physical assist                                   ADL either performed or assessed with clinical judgement   ADL Overall ADL's : Needs assistance/impaired                                       General ADL Comments: total assist for ADLs      Vision       Perception     Praxis      Cognition Arousal/Alertness: Awake/alert Behavior During Therapy: Flat affect Overall Cognitive Status: Impaired/Different from baseline                                 General Comments: pt with eyes open, not following commands, unable to verbally communicate due to vent; at completion of session, follow 1 command to look at hockey puck on R side of bed two times  Exercises     Shoulder Instructions       General Comments BP initally 130/84, end of session 149/84; RN present; repositioned pt in chair position with UEs elevated in bed    Pertinent Vitals/ Pain       Pain Assessment: Faces Faces Pain Scale: No hurt Pain Intervention(s): Monitored during session  Home Living                                          Prior Functioning/Environment              Frequency  Min 3X/week        Progress Toward Goals  OT Goals(current goals can now be found in the care plan section)  Progress towards OT goals: Not progressing toward goals - comment  Acute Rehab OT Goals Patient  Stated Goal: Unstated OT Goal Formulation: Patient unable to participate in goal setting  Plan Discharge plan remains appropriate    Co-evaluation    PT/OT/SLP Co-Evaluation/Treatment: Yes Reason for Co-Treatment: Complexity of the patient's impairments (multi-system involvement)   OT goals addressed during session: ADL's and self-care;Other (comment)(mobiltiy)      AM-PAC OT "6 Clicks" Daily Activity     Outcome Measure   Help from another person eating meals?: Total Help from another person taking care of personal grooming?: Total Help from another person toileting, which includes using toliet, bedpan, or urinal?: Total Help from another person bathing (including washing, rinsing, drying)?: Total Help from another person to put on and taking off regular upper body clothing?: Total Help from another person to put on and taking off regular lower body clothing?: Total 6 Click Score: 6    End of Session    OT Visit Diagnosis: Muscle weakness (generalized) (M62.81);Other symptoms and signs involving cognitive function   Activity Tolerance Patient limited by lethargy   Patient Left in bed;with nursing/sitter in room   Nurse Communication Mobility status        Time: 3785-8850 OT Time Calculation (min): 43 min  Charges: OT General Charges $OT Visit: 1 Visit OT Treatments $Self Care/Home Management : 8-22 mins  Chancy Milroy, OT Acute Rehabilitation Services Pager [phone] Office [phone]    Chancy Milroy 06/22/2018, 5:05 PM

## 2018-06-22 NOTE — Progress Notes (Signed)
Assisted tele visit to patient with girlfriend, Revonda Standard.  Vena Austria, RN

## 2018-06-22 NOTE — Progress Notes (Signed)
Physical Therapy Treatment Patient Details Name: Jesse [name] MRN: [id number] DOB: [birth year] Today's Date: 06/22/2018    History of Present Illness 28 year old male found down in car with suspected drug overdose versus status epilepticus. Intubated 4/20. MRI showing multiple areas of restricted diffusion, predominantly in the cerebellar hemispheres which might be representative of hypoxic/anoxic injury but specifically in bilateral globus pallidus and lentiform nuclei on the right. No significant medical past history but history of alcohol and drug abuse.     PT Comments    Pt performed sitting edge during session.  He continues to required +2 total assistance.  Pt pushing posteriorly.  He did look R when prompted to look at his hockey puck.  Pt with lots of secretions.  Noted to rotate to L side cervically, positioned to neutral with propping.  Plan for vital go bed for progression of PT.  Continue to recommend LTACH at this time.    Follow Up Recommendations  LTACH(pending medical plan due to poor prognosis)     Equipment Recommendations  (TBD)    Recommendations for Other Services (tbd)     Precautions / Restrictions Precautions Precautions: Fall Precaution Comments: neuro-storming, lots of secretions, vent, posturing Restrictions Weight Bearing Restrictions: No    Mobility  Bed Mobility Overal bed mobility: Needs Assistance Bed Mobility: Supine to Sit;Sit to Supine;Rolling Rolling: Total assist;+2 for physical assistance   Supine to sit: Total assist;+2 for physical assistance Sit to supine: Total assist;+2 for physical assistance   General bed mobility comments: pt requires total assist for all aspects of bed mobility at this time, assisted to EOB with no active particiaption.  intermittently posturing into extensor tone, heavy posterior lean; able to maintain EOB with max assist +2.  Initally unable to hold head up, but noted some initation and activation of  holding head up for short bouts.   Transfers                 General transfer comment: deferred, unsafe at this time  Ambulation/Gait             General Gait Details: unable at this time   Stairs             Wheelchair Mobility    Modified Rankin (Stroke Patients Only) Modified Rankin (Stroke Patients Only) Pre-Morbid Rankin Score: No symptoms Modified Rankin: Severe disability     Balance Overall balance assessment: Needs assistance Sitting-balance support: Feet supported;Bilateral upper extremity supported Sitting balance-Leahy Scale: Zero Sitting balance - Comments: pt dependent on physical assist                                    Cognition Arousal/Alertness: Awake/alert Behavior During Therapy: Flat affect Overall Cognitive Status: Impaired/Different from baseline                                 General Comments: pt with eyes open, not following commands, unable to verbally communicate due to vent; at completion of session, follow 1 command to look at hockey puck on R side of bed two times      Exercises      General Comments General comments (skin integrity, edema, etc.): BP initally 130/84, end of session 149/84; RN present; repositioned pt in chair position with UEs elevated in bed      Pertinent Vitals/Pain Pain Assessment: Faces Faces  Pain Scale: No hurt Pain Intervention(s): Monitored during session    Home Living                      Prior Function            PT Goals (current goals can now be found in the care plan section) Acute Rehab PT Goals Patient Stated Goal: Unstated Progress towards PT goals: Progressing toward goals    Frequency    Min 2X/week      PT Plan Current plan remains appropriate    Co-evaluation   Reason for Co-Treatment: Complexity of the patient's impairments (multi-system involvement)   OT goals addressed during session: ADL's and self-care;Other  (comment)(mobiltiy)      AM-PAC PT "6 Clicks" Mobility   Outcome Measure  Help needed turning from your back to your side while in a flat bed without using bedrails?: Total Help needed moving from lying on your back to sitting on the side of a flat bed without using bedrails?: Total Help needed moving to and from a bed to a chair (including a wheelchair)?: Total Help needed standing up from a chair using your arms (e.g., wheelchair or bedside chair)?: Total Help needed to walk in hospital room?: Total Help needed climbing 3-5 steps with a railing? : Total 6 Click Score: 6    End of Session   Activity Tolerance: Patient tolerated treatment well Patient left: in bed;with call bell/phone within reach;with family/visitor present Nurse Communication: Mobility status PT Visit Diagnosis: Other symptoms and signs involving the nervous system (R29.898)     Time: 3567-0141 PT Time Calculation (min) (ACUTE ONLY): 44 min  Charges:  $Therapeutic Activity: 23-37 mins                     Joycelyn Rua, PTA Acute Rehabilitation Services Pager [phone] Office [phone]     Jesse [name] Delay 06/22/2018, 6:16 PM

## 2018-06-22 NOTE — Progress Notes (Signed)
Assisted tele visit to patient with father.  Banessa Mao McEachran, RN  

## 2018-06-23 ENCOUNTER — Inpatient Hospital Stay (HOSPITAL_COMMUNITY): Payer: BLUE CROSS/BLUE SHIELD

## 2018-06-23 DIAGNOSIS — Z930 Tracheostomy status: Secondary | ICD-10-CM

## 2018-06-23 LAB — BASIC METABOLIC PANEL
Anion gap: 11 (ref 5–15)
BUN: 27 mg/dL — ABNORMAL HIGH (ref 6–20)
CO2: 27 mmol/L (ref 22–32)
Calcium: 10 mg/dL (ref 8.9–10.3)
Chloride: 103 mmol/L (ref 98–111)
Creatinine, Ser: 0.78 mg/dL (ref 0.61–1.24)
GFR calc Af Amer: >60 mL/min (ref >60)
GFR calc non Af Amer: >60 mL/min (ref >60)
Glucose, Bld: 99 mg/dL (ref 70–99)
Potassium: 4.3 mmol/L (ref 3.5–5.1)
Sodium: 141 mmol/L (ref 135–145)

## 2018-06-23 LAB — APTT: aPTT: 31 seconds (ref 24–36)

## 2018-06-23 LAB — GLUCOSE, CAPILLARY
Glucose-Capillary: 103 mg/dL — ABNORMAL HIGH (ref 70–99)
Glucose-Capillary: 90 mg/dL (ref 70–99)
Glucose-Capillary: 107 mg/dL — ABNORMAL HIGH (ref 70–99)
Glucose-Capillary: 91 mg/dL (ref 70–99)
Glucose-Capillary: 94 mg/dL (ref 70–99)
Glucose-Capillary: 99 mg/dL (ref 70–99)

## 2018-06-23 LAB — PROTIME-INR
INR: 1.2 (ref 0.8–1.2)
Prothrombin Time: 14.8 seconds (ref 11.4–15.2)

## 2018-06-23 MED ORDER — LEVETIRACETAM IN NACL 500 MG/100ML IV SOLN
500.0000 mg | Freq: Two times a day (BID) | INTRAVENOUS | Status: DC
  Administered 2018-06-23 – 2018-06-24 (×3): 500 mg via INTRAVENOUS
  Filled 2018-06-23 (×3): qty 100

## 2018-06-23 MED ORDER — SODIUM CHLORIDE 3 % IN NEBU
4.0000 mL | INHALATION_SOLUTION | Freq: Two times a day (BID) | RESPIRATORY_TRACT | Status: DC
  Administered 2018-06-23 – 2018-06-25 (×4): 4 mL via RESPIRATORY_TRACT
  Filled 2018-06-23 (×3): qty 4

## 2018-06-23 MED ORDER — MIDAZOLAM HCL 2 MG/2ML IJ SOLN
2.0000 mg | Freq: Once | INTRAMUSCULAR | Status: AC
  Administered 2018-06-23: 14:00:00 2 mg via INTRAVENOUS

## 2018-06-23 MED ORDER — VECURONIUM BROMIDE 10 MG IV SOLR
10.0000 mg | Freq: Once | INTRAVENOUS | Status: AC
  Administered 2018-06-23: 10 mg via INTRAVENOUS

## 2018-06-23 MED ORDER — ETOMIDATE 2 MG/ML IV SOLN
20.0000 mg | Freq: Once | INTRAVENOUS | Status: AC
  Administered 2018-06-23: 20 mg via INTRAVENOUS

## 2018-06-23 MED ORDER — FENTANYL CITRATE (PF) 100 MCG/2ML IJ SOLN
100.0000 ug | Freq: Once | INTRAMUSCULAR | Status: AC
  Administered 2018-06-23: 14:00:00 100 ug via INTRAVENOUS

## 2018-06-23 MED ORDER — FENTANYL CITRATE (PF) 100 MCG/2ML IJ SOLN
100.0000 ug | Freq: Once | INTRAMUSCULAR | Status: AC
  Administered 2018-06-23: 100 ug via INTRAVENOUS

## 2018-06-23 MED ORDER — DEXTROSE IN LACTATED RINGERS 5 % IV SOLN
INTRAVENOUS | Status: DC
  Administered 2018-06-23 – 2018-06-25 (×3): via INTRAVENOUS

## 2018-06-23 NOTE — Progress Notes (Signed)
Physical Therapy Treatment Patient Details Name: Jesse BaliMichael David [name] MRN: [id number] DOB: [birth year] Today's Date: 06/23/2018    History of Present Illness 28 year old male found down in car with suspected drug overdose versus status epilepticus. Intubated 4/20. MRI showing multiple areas of restricted diffusion, predominantly in the cerebellar hemispheres which might be representative of hypoxic/anoxic injury but specifically in bilateral globus pallidus and lentiform nuclei on the right. No significant medical past history but history of alcohol and drug abuse.     PT Comments    Pt performed lateral transfer to vital go lift bed.  Pt s/p trach placement today.  Performed series of 3 tilts while monitoring BP.  Pt able to perform gentle PROM to B UEs while tilted.  Pt gazed to right to look at his hockey puck.  Continue to recommend LTACH at this time.    Follow Up Recommendations  LTACH(pending medical plan due to poor prognosis.  )     Equipment Recommendations  (TBD)    Recommendations for Other Services (TBD)     Precautions / Restrictions Precautions Precautions: Fall Precaution Comments: neuro-storming, trached 4/30.   Restrictions Weight Bearing Restrictions: No    Mobility  Bed Mobility               General bed mobility comments: Performed +4 assistance to move patient from hospital bed to vital go lift bed, lateral scoot from bed to bed.    Transfers Overall transfer level: Needs assistance Equipment used: (vital go lift bed.  )             General transfer comment: performed tilt in vital go lift bed. HR remains stable at 92bpm during tilts.  Pt tolerated well.  Performed x3 tilts.  Starting BP at 0*, 1st tilt BP 117/76 12* 38 #, 2nd tilt BP 103/62 25* 62#/ after 3 min BP 113/66, 3rd tilt BP 110/69 35* 78#.    Ambulation/Gait                 Stairs             Wheelchair Mobility    Modified Rankin (Stroke Patients Only) Modified  Rankin (Stroke Patients Only) Pre-Morbid Rankin Score: No symptoms Modified Rankin: Severe disability     Balance                                            Cognition Arousal/Alertness: Awake/alert Behavior During Therapy: Flat affect Overall Cognitive Status: Impaired/Different from baseline                                 General Comments: pt with eyes open, not following commands, unable to verbally communicate due to vent; at completion of session, follow 1 command to look at hockey puck on R side of bed two times      Exercises      General Comments        Pertinent Vitals/Pain Pain Assessment: Faces Faces Pain Scale: No hurt    Home Living                      Prior Function            PT Goals (current goals can now be found in the care plan section) Acute Rehab PT Goals  Patient Stated Goal: Unstated Progress towards PT goals: Progressing toward goals    Frequency    Min 2X/week      PT Plan Current plan remains appropriate    Co-evaluation              AM-PAC PT "6 Clicks" Mobility   Outcome Measure  Help needed turning from your back to your side while in a flat bed without using bedrails?: Total Help needed moving from lying on your back to sitting on the side of a flat bed without using bedrails?: Total Help needed moving to and from a bed to a chair (including a wheelchair)?: Total Help needed standing up from a chair using your arms (e.g., wheelchair or bedside chair)?: Total Help needed to walk in hospital room?: Total Help needed climbing 3-5 steps with a railing? : Total 6 Click Score: 6    End of Session   Activity Tolerance: Patient tolerated treatment well Patient left: in bed;with call bell/phone within reach;with family/visitor present Nurse Communication: Mobility status PT Visit Diagnosis: Other symptoms and signs involving the nervous system (R29.898)     Time: 7654-6503 PT  Time Calculation (min) (ACUTE ONLY): 32 min  Charges:  $Therapeutic Activity: 23-37 mins                     Joycelyn Rua, PTA Acute Rehabilitation Services Pager [phone] Office [phone]     [name] 06/23/2018, 5:19 PM

## 2018-06-23 NOTE — Progress Notes (Signed)
Assisted tele visit to patient with father.  Vuk Skillern McEachran, RN  

## 2018-06-23 NOTE — Progress Notes (Signed)
PULMONARY / CRITICAL CARE MEDICINE   NAME:  Jesse [name], MRN:  [id number], DOB:  1990/10/05, LOS: 10 ADMISSION DATE:  06/13/2018, CONSULTATION DATE:  06/13/2018 REFERRING MD:  Pilar Plate, (ED), CHIEF COMPLAINT:  Altered MS  BRIEF HISTORY:    28 yo male found in car unresponsive.  Intubated for airway protection.  Neuro imaging shows changes of diffuse anoxic injury.  UDS positive for cocaine, benzo's, amphetamines.  SIGNIFICANT PAST MEDICAL HISTORY   Substance abuse, Depression with prior suicide attempt  SIGNIFICANT EVENTS:  4/20 Admit with AMS, intubated  STUDIES:   MRI brain 4/20 >> multifocal diffusion abnormality within gray nuclei, b/l supratentorial white matter and b/l cerebellar hemispheres Echo 4/21 >> EF 55 to 60% EEG 4/22 >> background slowing  CULTURES:  Blood 4/20 > negative Trach aspirate 4/25 > negative HIV 4/20 > non reactive Covid 19 4/20 > Negative Urine 4/10 > No growth  ANTIBIOTICS:  Unasyn 4/21 >> completed 7 days  LINES/TUBES:  ETT 4/20 >  CONSULTANTS:  Neurology  SUBJECTIVE:  No significant changes  CONSTITUTIONAL: Blood Pressure 110/69   Pulse 87   Temperature 99 F (37.2 C) (Axillary)   Respiration 17   Height 6' (1.829 m)   Weight 83.9 kg   Oxygen Saturation 94%   Body Mass Index 25.09 kg/m   I/O last 3 completed shifts: In: 10 [Other:30; NG/GT:1950] Out: 1775 [Urine:1775]     Vent Mode: CPAP;PSV FiO2 (%):  [40 %] 40 % Set Rate:  [16 bmp] 16 bmp Vt Set:  [423 mL] 620 mL PEEP:  [5 cmH20] 5 cmH20 Pressure Support:  [5 cmH20] 5 cmH20 Plateau Pressure:  [18 cmH20-19 cmH20] 19 cmH20  PHYSICAL EXAM: Neuro: Well-developed 28 year old male patient currently remains comatose on the vent will open eyes occasionally, intermittently nursing reports possibly purposeful movement HEENT normocephalic atraumatic no jugular venous distention mucous membranes moist Pulmonary: Clear to auscultation Cardiac: Regular rate and rhythm Abdomen:  Soft nontender Extremities: Warm dry brisk cap refill no significant edema GU: Clear yellow.    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  There is also question of possible TBI Plan Cont AEDs Cont propranolol for possible neuro storm  Hoping for neurological rehab  Acute respiratory failure with hypoxia and inability to protect airway. Plan Continue full ventilator support VAP bundle For tracheostomy today  Mild leukocytosis with low-grade fever. No new chest x-ray changes, actually aeration improved Plan Trend fever and wbc curve  intermittent fluid and electrolyte imbalance  Plan Intermittent chems  Urine retention. Plan Cont Urecholine    Best Practice / Goals of Care / Disposition.   DVT PROPHYLAXIS: Lovenox SUP: protonix NUTRITION: tube feeds MOBILITY: bed rest GOALS OF CARE: full code DISPOSITION: ICU Needs trach, then possibly LTAC vs SNF. Unlikely to be accepted into intensive neuro rehab   [facility] Pulmonary/Critical Care Pager # [phone] OR # [phone] if no answer

## 2018-06-23 NOTE — Procedures (Signed)
Bronchoscopy Procedure Note Jesse [name] [id number] 1990-07-15  Procedure: Bronchoscopy Indications: tracheostomy placement   Procedure Details Consent: Risks of procedure as well as the alternatives and risks of each were explained to the (patient/caregiver).  Consent for procedure obtained. Time Out: Verified patient identification, verified procedure, site/side was marked, verified correct patient position, special equipment/implants available, medications/allergies/relevent history reviewed, required imaging and test results available.  Performed  In preparation for procedure, patient was given 100% FiO2, bronchoscope lubricated and inhaled beta agonist administered. Sedation: Benzodiazepines, Muscle relaxants and Etomidate  Procedure done by P Annette Bertelson ACNP-BC, under direct supervision of Dr Ollen Bowl. At first bronch was introduce through ET tube and structures of tracheal rings, carina identified for operator of tracheostomy who was Dr Molli Knock. Light of bronch passed through trachea and skin for indentification of tracheal rings for tracheostomy puncture. After this, under bronchoscopy guidance,  ET tube was pulled back sufficiently and very carefully. The ET tube was  pulled back enough to give room for tracheostomy operator and yet at same time to to ensure a secured airway. After this was accomplished, bronchoscope was withdrawn into the ET tube. After this,  Dr Molli Knock  then performed tracheostomy under video visual provided by flexible video bronchoscopy. Followng introduction of tracheostomy,  the bronchoscope was removed from ET tube and introduced through tracheostomy. Correct position of tracheostomy was ensured, with enough room between carina and distal tracheostomy and no evidence of bleeding. The bronchoscope was then withdrawn. Respiratory therapist was then instructed to remove the ET tube.  Dr Molli Knock  then proceeded to complete the tracheostomy with stay sutures   No  complications   Procedures performed: Brushings performed Bronchoscope removed.    Evaluation Hemodynamic Status: BP stable throughout; O2 sats: stable throughout Patient's Current Condition: stable Specimens:  None Complications: No apparent complications Patient did tolerate procedure well.  Simonne Martinet ACNP-BC [facility] Pulmonary/Critical Care Pager # [phone] OR # [phone] if no answer  Shelby Mattocks 06/23/2018

## 2018-06-23 NOTE — Procedures (Signed)
Percutaneous Tracheostomy Placement  Consent from family.  Patient sedated, paralyzed and position.  Placed on 100% FiO2 and RR matched.  Area cleaned and draped.  Lidocaine/epi injected.  Skin incision done followed by blunt dissection.  Trachea palpated then punctured, catheter passed and visualized bronchoscopically.  Wire placed and visualized.  Catheter removed.  Airway then entered and dilated.  Size 8 cuffed shiley trach placed and visualized bronchoscopically well above carina.  Good volume returns.  Patient tolerated the procedure well without complications.  Minimal blood loss.  CXR ordered and pending.  Wesam G. Yacoub, M.D. [facility]. Pager: [phone]. After hours pager: [phone]. 

## 2018-06-23 NOTE — Progress Notes (Signed)
Postoperative chest x-ray reviewed.  The tracheostomy tube is in satisfactory position with no complicating factors appreciated.  He does however have new right lower lobe lung collapse.  I suspect he probably de recruited during tracheostomy Plan Adding pulmonary hygiene with chest physiotherapy Hypertonic saline Respiratory culture Repeat a.m. chest x-ray  Simonne Martinet ACNP-BC [facility] Pulmonary/Critical Care Pager # [phone] OR # [phone] if no answer

## 2018-06-23 NOTE — Progress Notes (Addendum)
RT called to pt's room due to pt's SATS dropped to the low 80's.  Pt lavaged, got lots of secretions, but SATS remained in the mid 80s.  Pt was bagged lavaged and ventilated until SATS increased above 90%.   Pt placed back on full support with FiO2 increased to 100% and PEEP increased to 10.  Spoke to Dr. Warrick Parisian, made him aware of the changes.  He wants FiO2 and PEEP to be waened as tolerated.

## 2018-06-23 NOTE — Progress Notes (Signed)
eLink Physician-Brief Progress Note Patient Name: Jesse [name] DOB: 1990/05/24 MRN: [id number]   Date of Service  06/23/2018  HPI/Events of Note  RN requests changing Keppra to iv. Pt is NPO s/p tracheostomy. He also has no iv fluids.  eICU Interventions  Keppra switched to iv, D5 LR started at 75 ml/hr        Curlie Sittner U Latecia Miler 06/23/2018, 9:14 PM

## 2018-06-24 ENCOUNTER — Inpatient Hospital Stay (HOSPITAL_COMMUNITY): Payer: BLUE CROSS/BLUE SHIELD

## 2018-06-24 LAB — GLUCOSE, CAPILLARY
Glucose-Capillary: 102 mg/dL — ABNORMAL HIGH (ref 70–99)
Glucose-Capillary: 110 mg/dL — ABNORMAL HIGH (ref 70–99)
Glucose-Capillary: 117 mg/dL — ABNORMAL HIGH (ref 70–99)
Glucose-Capillary: 142 mg/dL — ABNORMAL HIGH (ref 70–99)
Glucose-Capillary: 124 mg/dL — ABNORMAL HIGH (ref 70–99)

## 2018-06-24 NOTE — Progress Notes (Signed)
Physical Therapy Treatment Patient Details Name: Jesse [name] MRN: [id number] DOB: October 25, 1990 Today's Date: 06/24/2018    History of Present Illness 28 year old male found down in car with suspected drug overdose versus status epilepticus. Intubated 4/20. MRI showing multiple areas of restricted diffusion, predominantly in the cerebellar hemispheres which might be representative of hypoxic/anoxic injury but specifically in bilateral globus pallidus and lentiform nuclei on the right. No significant medical past history but history of alcohol and drug abuse.     PT Comments    Pt with some noted progression this session. Focused on tilting in Vital Go bed to promote upright positioning. Pt tolerated 35 deg for ~10 minutes, progressing to 65 deg for 25 minutes. In conjunction with video session with pt mother, pt began following some simple commands I.e. blinking, opening mouth, consistently visually tracking therapists, turning head to right and left with tactile assistance. Pt displaying some right shoulder initiation/movement when attempting to reach for hockey puck. Continues with significant weakness.     Follow Up Recommendations  LTACH     Equipment Recommendations       Recommendations for Other Services       Precautions / Restrictions Precautions Precautions: Fall Precaution Comments: Pt trached  Restrictions Weight Bearing Restrictions: No    Mobility  Bed Mobility                  Transfers Overall transfer level: Needs assistance               General transfer comment: Tilted pt in vital tilt and go.  Progressed initially to 35% tilt for ~10 mins with pt tolerating well.   Then progressed to 65% tilt where he remained for 25 mins.    HR to 126 with 02 sats decreased to 87% with Fi02 40% PEEP 5, BP 112/78.  Pt returned to supine, RN notified and sats to 88-91% once pt returned to supine   Ambulation/Gait                 Stairs              Wheelchair Mobility    Modified Rankin (Stroke Patients Only)       Balance                                            Cognition Arousal/Alertness: Awake/alert Behavior During Therapy: Flat affect Overall Cognitive Status: Impaired/Different from baseline Area of Impairment: Attention;Following commands                   Current Attention Level: Focused   Following Commands: Follows one step commands inconsistently;Follows one step commands with increased time       General Comments: Pt will consistently tracked therapists in room.  He would look, with eyes toward therapist on command.  He appeared to initiate Rt shoulder movement in attempts to reach for hockey puck when instructed to do so, but pulls into flexor spasticity making it difficult to determine if he was actively initiating movement or if movement due to spasiticy.   Mother called in in the middle of session, and face timed with him.  He responded very well to her.  He consistently looked to the screen on command, he blinked his eyes when she requested he do so.  He opened his mouth x 1 on command.  With head supported off bed, he was able to initiate rotation of head/neck Lt and Rt on command, with assist, and did actively attempt to lift Rt hand to mother's command.         Exercises General Exercises - Upper Extremity Shoulder Flexion: PROM;Both;10 reps;Supine Elbow Flexion: PROM;5 reps;Standing Elbow Extension: PROM;Both;5 reps;Standing Wrist Flexion: PROM;Both;10 reps;Supine Wrist Extension: PROM;Both;10 reps;Supine Digit Composite Flexion: PROM;Both;10 reps;Standing Composite Extension: PROM;Both;10 reps;Supine    General Comments        Pertinent Vitals/Pain Pain Assessment: Faces Faces Pain Scale: No hurt Pain Intervention(s): Monitored during session    Home Living                      Prior Function            PT Goals (current goals can now be found  in the care plan section) Progress towards PT goals: Progressing toward goals    Frequency    Min 2X/week      PT Plan Current plan remains appropriate    Co-evaluation PT/OT/SLP Co-Evaluation/Treatment: Yes Reason for Co-Treatment: Complexity of the patient's impairments (multi-system involvement);Necessary to address cognition/behavior during functional activity;For patient/therapist safety;To address functional/ADL transfers PT goals addressed during session: Mobility/safety with mobility;Strengthening/ROM OT goals addressed during session: Strengthening/ROM;ADL's and self-care      AM-PAC PT "6 Clicks" Mobility   Outcome Measure  Help needed turning from your back to your side while in a flat bed without using bedrails?: Total Help needed moving from lying on your back to sitting on the side of a flat bed without using bedrails?: Total Help needed moving to and from a bed to a chair (including a wheelchair)?: Total Help needed standing up from a chair using your arms (e.g., wheelchair or bedside chair)?: Total Help needed to walk in hospital room?: Total Help needed climbing 3-5 steps with a railing? : Total 6 Click Score: 6    End of Session   Activity Tolerance: Patient tolerated treatment well Patient left: in bed;with call bell/phone within reach;with family/visitor present Nurse Communication: Mobility status PT Visit Diagnosis: Other symptoms and signs involving the nervous system (W09.811(R29.898)     Time: 9147-82951031-1129 PT Time Calculation (min) (ACUTE ONLY): 58 min  Charges:  $Therapeutic Activity: 23-37 mins                     Laurina Bustlearoline Winnie Umali, PT, DPT Acute Rehabilitation Services Pager [phone] Office [phone]    [name] 06/24/2018, 1:48 PM

## 2018-06-24 NOTE — Progress Notes (Signed)
Assisted tele visit to patient with girlfriend.  Denaja Verhoeven M Dravyn Severs, RN   

## 2018-06-24 NOTE — Progress Notes (Signed)
Pt placed back no full support on previous settings.

## 2018-06-24 NOTE — Progress Notes (Signed)
PULMONARY / CRITICAL CARE MEDICINE   NAME:  Jesse [name], MRN:  [id number], DOB:  03/20/1990, LOS: 11 ADMISSION DATE:  06/13/2018, CONSULTATION DATE:  06/13/2018 REFERRING MD:  Pilar PlateBero, (ED), CHIEF COMPLAINT:  Altered MS  BRIEF HISTORY:    28 yo male found in car unresponsive.  Intubated for airway protection.  Neuro imaging shows changes of diffuse anoxic injury.  UDS positive for cocaine, benzo's, amphetamines.  SIGNIFICANT PAST MEDICAL HISTORY   Substance abuse, Depression with prior suicide attempt  SIGNIFICANT EVENTS:  4/20 Admit with AMS, intubated  STUDIES:   MRI brain 4/20 >> multifocal diffusion abnormality within gray nuclei, b/l supratentorial white matter and b/l cerebellar hemispheres Echo 4/21 >> EF 55 to 60% EEG 4/22 >> background slowing CXR 5/1:   CULTURES:  Blood 4/20 > negative Trach aspirate 4/25 > negative HIV 4/20 > non reactive Covid 19 4/20 > Negative Urine 4/10 > No growth 4/30 trach aspirate few GPC, few GNR; still pending  ANTIBIOTICS:  Unasyn 4/21 >> completed 7 days  LINES/TUBES:  ETT 4/20 >trach 4/30  CONSULTANTS:  Neurology  SUBJECTIVE:  No significant changes  CONSTITUTIONAL: BP 120/62   Pulse 99   Temp (!) 100.9 F (38.3 C) (Axillary)   Resp 19   Ht 6' (1.829 m)   Wt 67.8 kg   SpO2 95%   BMI 20.28 kg/m   I/O last 3 completed shifts: In: 1340.7 [I.V.:690.7; Other:30; NG/GT:520; IV Piggyback:100] Out: 1475 [Urine:1475]     Vent Mode: CPAP;PSV FiO2 (%):  [40 %-100 %] 40 % Set Rate:  [16 bmp] 16 bmp Vt Set:  [620 mL] 620 mL PEEP:  [5 cmH20-10 cmH20] 5 cmH20 Pressure Support:  [5 cmH20-10 cmH20] 10 cmH20 Plateau Pressure:  [14 cmH20-18 cmH20] 16 cmH20  PHYSICAL EXAM: Neuro: Well-developed 28 year old male patient currently remains min. Responsive. Attempted to stick out tongue but would not open mouth. Does not respond to request/command. PERRLA, but disconjugate until painful stimuli (w/d to stim bil. LE's, right hand).  on the vent will open eyes occasionally, intermittently nursing reports possibly purposeful movement HEENT normocephalic atraumatic no jugular venous distention mucous membranes moist Pulmonary: Clear to auscultation Cardiac: Regular rate and rhythm Abdomen: Soft nontender Extremities: Warm dry brisk cap refill no significant edema GU: Clear yellow.    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  There is also question of possible TBI Plan Cont AEDs Cont propranolol for possible neuro storm  Hoping for neurological rehab (mother reports that she [facility] [place] 4/[place])  Acute respiratory failure with hypoxia and inability to protect airway. Plan Continue full ventilator support, wean to trach collar likely in next 24-48 hours Trach aspirate 4/30 pending, holding abx given improved CXR, nominal temp curve VAP bundle   Mild leukocytosis with low-grade fever. No new chest x-ray changes, actually aeration improved Plan Trend fever and wbc curve; CBC 5/2  intermittent fluid and electrolyte imbalance  Plan Intermittent chems  Urine retention. Plan Cont Urecholine    Best Practice / Goals of Care / Disposition.   DVT PROPHYLAXIS: Lovenox SUP: protonix NUTRITION: tube feeds MOBILITY: bed rest GOALS OF CARE: full code DISPOSITION: ICU  possibly LTAC vs SNF. Unlikely to be accepted into intensive neuro rehab  FAMILY: spoke with mother, aprised of neuro status 5/1;    I have independently  seen and examined the patient, reviewed data, and developed an assessment and plan with the APP. A total of 36 minutes were spent in critical care assessment and medical decision making. This critical care time does not reflect procedure time, or teaching time or supervisory time of PA/NP/Med student/Med  Resident, etc but could involve care discussion time.  Spoke with mother this morning.  Gwynne Edinger, MD PhD 06/24/18       Gwynne Edinger 06/24/2018, 9:21 AM

## 2018-06-24 NOTE — Progress Notes (Cosign Needed)
PULMONARY / CRITICAL CARE MEDICINE   NAME:  Jesse [name], MRN:  [id number], DOB:  October 11, 1990, LOS: 11 ADMISSION DATE:  06/13/2018, CONSULTATION DATE:  06/13/2018 REFERRING MD:  Jesse [name], (ED), CHIEF COMPLAINT:  Altered MS  BRIEF HISTORY:    28 yo male found in car unresponsive.  Intubated for airway protection.  Neuro imaging shows changes of diffuse anoxic injury.  UDS positive for cocaine, benzo's, amphetamines.  SIGNIFICANT PAST MEDICAL HISTORY   Substance abuse, Depression with prior suicide attempt  SIGNIFICANT EVENTS:  4/20 Admit with AMS, intubated  STUDIES:   MRI brain 4/20 >> multifocal diffusion abnormality within gray nuclei, b/l supratentorial white matter and b/l cerebellar hemispheres Echo 4/21 >> EF 55 to 60% EEG 4/22 >> background slowing  CULTURES:  Blood 4/20 > negative Trach aspirate 4/25 > negative HIV 4/20 > non reactive Covid 19 4/20 > Negative Urine 4/10 > No growth  ANTIBIOTICS:  Unasyn 4/21 >> completed 7 days  LINES/TUBES:  ETT 4/20 > 4/30 Trach 4/30>  CONSULTANTS:  Neurology  SUBJECTIVE:  No significant changes ? If some intermittent tracking  Weaning 8/5 + 18 L per Epic  CONSTITUTIONAL: BP 124/79   Pulse (!) 103   Temp (!) 100.9 F (38.3 C) (Axillary)   Resp 17   Ht 6' (1.829 m)   Wt 67.8 kg   SpO2 97%   BMI 20.28 kg/m   I/O last 3 completed shifts: In: 1340.7 [I.V.:690.7; Other:30; NG/GT:520; IV Piggyback:100] Out: 1475 [Urine:1475]     Vent Mode: CPAP;PSV FiO2 (%):  [40 %-100 %] 40 % Set Rate:  [16 bmp] 16 bmp Vt Set:  [620 mL] 620 mL PEEP:  [5 cmH20-10 cmH20] 5 cmH20 Pressure Support:  [5 cmH20-10 cmH20] 10 cmH20 Plateau Pressure:  [14 cmH20-18 cmH20] 16 cmH20  PHYSICAL EXAM: Neuro: Well-developed 28 year old male patient currently remains comatose on the vent will open eyes occasionally, intermittently nursing reports possibly purposeful movement, tracking HEENT: NCAT, no jugular venous distention mucous membranes  moist, Trach is midline and secure, sutures intact Pulmonary: Bilateral chest excursion, Few rhonchi Cardiac: S1, S2, Regular rate and rhythm, No MRG Abdomen: Soft nontender, ND, BS + Extremities: Warm dry brisk cap refill no significant edema GU: Clear yellow.    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  There is also question of possible TBI Some tracking noted by nursing staff Plan Cont AEDs Cont propranolol for possible neuro storm  Hoping for neurological rehab  Acute respiratory failure with hypoxia and inability to protect airway. Trached 4/30 Plan SBT as able  VAP bundle CXR prn   Mild leukocytosis with low-grade fever. No new chest x-ray changes, actually aeration improved Tracheal Aspirate 4/30 with few staph aureus and few gram negative rods Plan Trend fever and wbc curve Re-culture as is clinically indicated Trend micro>> tracheal aspirate 4/30 Consider coverage for HCAP ( vanc cefepime) PCT 5/2  intermittent fluid and electrolyte imbalance  Plan Trend BMET  Replete electrolytes as needed  Urine retention. Plan Cont Urecholine Bladder scans as needed    Best Practice / Goals of Care / Disposition.   DVT PROPHYLAXIS: Lovenox SUP: protonix NUTRITION: tube feeds MOBILITY: bed rest GOALS OF CARE: full code DISPOSITION: ICU Trach 4/30>> then possibly LTAC vs SNF. Unlikely to be accepted into intensive neuro rehab   Jesse [name], AGACNP-BC [facility] Pager # [phone] After 4 pm call [phone] 06/24/2018 9:10 AM

## 2018-06-24 NOTE — Progress Notes (Signed)
Assisted tele visit to patient with mother.  Nalleli Largent M Phinley Schall, RN   

## 2018-06-24 NOTE — Evaluation (Signed)
SLP Cancellation Note  Patient Details Name: Jesse [name] MRN: [id number] DOB: [birth year]   Cancelled treatment:       Reason Eval/Treat Not Completed: Other (comment)(Received order for swallow evaluation.  Pt currently remains on vent, issues last night with oxygen saturation decline with pt requiring bagging and lavaging with removal of secretions per note, will continue efforts)   Chales Abrahams 06/24/2018, 8:39 AM   Donavan Burnet, MS [facility] SLP Acute Rehab Services Pager [phone] Office [phone]

## 2018-06-24 NOTE — Progress Notes (Signed)
Occupational Therapy Treatment Patient Details Name: Jesse [name] MRN: [id number] DOB: 1990-09-01 Today's Date: 06/24/2018    History of present illness 28 year old male found down in car with suspected drug overdose versus status epilepticus. Intubated 4/20. MRI showing multiple areas of restricted diffusion, predominantly in the cerebellar hemispheres which might be representative of hypoxic/anoxic injury but specifically in bilateral globus pallidus and lentiform nuclei on the right. No significant medical past history but history of alcohol and drug abuse.    OT comments  Pt seen in conjunction with PT.  Using Vital Tilt and Go bed, tilted him to 35% for ~10 mins with him tolerating well, then progressed to 65% for 25 mins.   As he fatigued HR increased to 126, 02 sats decreased to 87% on 40% Fi02, and BP remained stable at 112/78, and he was returned to supine.  While in tilted position, pt's mother called in and face timed with him.  He responded very well to her.  He closed eyes on command consistently.  He looked to therapists and to mother on command consistently, he attempted to lift Rt UE, opened mouth x 1, and with head supported, initiated small excursion of head/neck rotation Lt and Rt on command.  He is very weak, and requires increased time to initiate a response.  Requires support to execute appendicular movement - he is able to initiate some movements, but is to weak to move against gravity.  Currently, he is likely LTACH appropriate, but may progress to CIR level therapies.  Will continue to follow.   Follow Up Recommendations    LTACH vs CIR    Equipment Recommendations  Other (comment)(TBD)    Recommendations for Other Services      Precautions / Restrictions Precautions Precautions: Fall Precaution Comments: Pt trached        Mobility Bed Mobility                  Transfers Overall transfer level: Needs assistance               General transfer  comment: Tilted pt in vital tilt and go.  Progressed initially to 35% tilt for ~10 mins with pt tolerating well.   Then progressed to 65% tilt where he remained for 25 mins.    HR to 126 with 02 sats decreased to 87% with Fi02 40% PEEP 5, BP 112/78.  Pt returned to supine, RN notified and sats to 888-91% once pt returned to supine     Balance                                           ADL either performed or assessed with clinical judgement   ADL                                         General ADL Comments: total A for all aspects of ADLs      Vision       Perception     Praxis      Cognition Arousal/Alertness: Awake/alert Behavior During Therapy: Flat affect Overall Cognitive Status: Impaired/Different from baseline Area of Impairment: Attention;Following commands                   Current Attention Level: Focused  Following Commands: Follows one step commands inconsistently;Follows one step commands with increased time       General Comments: Pt will consistently tracked therapists in room.  He would look, with eyes toward therapist on command.  He appeared to initiate Rt shoulder movement in attempts to reach for hockey puck when instructed to do so, but pulls into flexor spasticity making it difficult to determine if he was actively initiating movement or if movement due to spasiticy.   Mother called in in the middle of session, and face timed with him.  He responded very well to her.  He consistently looked to the screen on command, he blinked his eyes when she requested he do so.  He opened his mouth x 1 on command.   With head supported off bed, he was able to initiate rotation of head/neck Lt and Rt on command, with assist, and did actively attempt to lift Rt hand to mother's command.           Exercises General Exercises - Upper Extremity Shoulder Flexion: PROM;Both;10 reps;Supine Elbow Flexion: PROM;5 reps;Standing Elbow  Extension: PROM;Both;5 reps;Standing Wrist Flexion: PROM;Both;10 reps;Supine Wrist Extension: PROM;Both;10 reps;Supine Digit Composite Flexion: PROM;Both;10 reps;Standing Composite Extension: PROM;Both;10 reps;Supine   Shoulder Instructions       General Comments      Pertinent Vitals/ Pain       Pain Assessment: Faces Faces Pain Scale: No hurt Pain Intervention(s): Monitored during session  Home Living                                          Prior Functioning/Environment              Frequency  Min 3X/week        Progress Toward Goals  OT Goals(current goals can now be found in the care plan section)  Progress towards OT goals: Progressing toward goals     Plan Discharge plan remains appropriate    Co-evaluation    PT/OT/SLP Co-Evaluation/Treatment: Yes Reason for Co-Treatment: Complexity of the patient's impairments (multi-system involvement);Necessary to address cognition/behavior during functional activity;For patient/therapist safety;To address functional/ADL transfers   OT goals addressed during session: Strengthening/ROM;ADL's and self-care      AM-PAC OT "6 Clicks" Daily Activity     Outcome Measure   Help from another person eating meals?: Total Help from another person taking care of personal grooming?: Total Help from another person toileting, which includes using toliet, bedpan, or urinal?: Total Help from another person bathing (including washing, rinsing, drying)?: Total Help from another person to put on and taking off regular upper body clothing?: Total Help from another person to put on and taking off regular lower body clothing?: Total 6 Click Score: 6    End of Session Equipment Utilized During Treatment: Other (comment);Oxygen(Vital tilt and Go bed )  OT Visit Diagnosis: Muscle weakness (generalized) (M62.81);Other symptoms and signs involving cognitive function   Activity Tolerance Patient tolerated treatment  well   Patient Left in bed;with call bell/phone within reach;with nursing/sitter in room   Nurse Communication Mobility status;Other (comment)(02 sat decreased )        Time: 6811-5726 OT Time Calculation (min): 68 min  Charges: OT General Charges $OT Visit: 1 Visit OT Treatments $Neuromuscular Re-education: 38-52 mins  [name], OTR/L Acute Rehabilitation Services Pager [phone] Office [phone]    [name] M 06/24/2018, 12:06 PM

## 2018-06-24 NOTE — Procedures (Signed)
Cortrak  Person Inserting Tube:  Dim Meisinger C, RD Tube Type:  Cortrak - 43 inches Tube Location:  Left nare Initial Placement:  Stomach Secured by: Bridle Technique Used to Measure Tube Placement:  Documented cm marking at nare/ corner of mouth Cortrak Secured At:  72 cm    Cortrak Tube Team Note:  Consult received to place a Cortrak feeding tube.   No x-ray is required. RN may begin using tube.   If the tube becomes dislodged please keep the tube and contact the Cortrak team at [email or web address] ([email or web address]) for replacement.  If after hours and replacement cannot be delayed, place a NG tube and confirm placement with an abdominal x-ray.    Sora Olivo RD, LDN, CNSC [phone] Pager [phone] After Hours Pager   

## 2018-06-25 ENCOUNTER — Inpatient Hospital Stay (HOSPITAL_COMMUNITY): Payer: BLUE CROSS/BLUE SHIELD

## 2018-06-25 LAB — COMPREHENSIVE METABOLIC PANEL
ALT: 77 U/L — ABNORMAL HIGH (ref 0–44)
AST: 39 U/L (ref 15–41)
Albumin: 3.1 g/dL — ABNORMAL LOW (ref 3.5–5.0)
Alkaline Phosphatase: 70 U/L (ref 38–126)
Anion gap: 11 (ref 5–15)
BUN: 29 mg/dL — ABNORMAL HIGH (ref 6–20)
CO2: 28 mmol/L (ref 22–32)
Calcium: 9.3 mg/dL (ref 8.9–10.3)
Chloride: 103 mmol/L (ref 98–111)
Creatinine, Ser: 0.86 mg/dL (ref 0.61–1.24)
GFR calc Af Amer: >60 mL/min (ref >60)
GFR calc non Af Amer: >60 mL/min (ref >60)
Glucose, Bld: 102 mg/dL — ABNORMAL HIGH (ref 70–99)
Potassium: 4 mmol/L (ref 3.5–5.1)
Sodium: 142 mmol/L (ref 135–145)
Total Bilirubin: 1.2 mg/dL (ref 0.3–1.2)
Total Protein: 6.9 g/dL (ref 6.5–8.1)

## 2018-06-25 LAB — CBC WITH DIFFERENTIAL/PLATELET
Abs Immature Granulocytes: 0.17 10*3/uL — ABNORMAL HIGH (ref 0.00–0.07)
Basophils Absolute: 0.1 10*3/uL (ref 0.0–0.1)
Basophils Relative: 0 %
Eosinophils Absolute: 0.3 10*3/uL (ref 0.0–0.5)
Eosinophils Relative: 1 %
HCT: 36.9 % — ABNORMAL LOW (ref 39.0–52.0)
Hemoglobin: 12.3 g/dL — ABNORMAL LOW (ref 13.0–17.0)
Immature Granulocytes: 1 %
Lymphocytes Relative: 7 %
Lymphs Abs: 1.8 10*3/uL (ref 0.7–4.0)
MCH: 30.7 pg (ref 26.0–34.0)
MCHC: 33.3 g/dL (ref 30.0–36.0)
MCV: 92 fL (ref 80.0–100.0)
Monocytes Absolute: 1.9 10*3/uL — ABNORMAL HIGH (ref 0.1–1.0)
Monocytes Relative: 8 %
Neutro Abs: 20.8 10*3/uL — ABNORMAL HIGH (ref 1.7–7.7)
Neutrophils Relative %: 83 %
Platelets: 460 10*3/uL — ABNORMAL HIGH (ref 150–400)
RBC: 4.01 MIL/uL — ABNORMAL LOW (ref 4.22–5.81)
RDW: 12.5 % (ref 11.5–15.5)
WBC: 25 10*3/uL — ABNORMAL HIGH (ref 4.0–10.5)
nRBC: 0 % (ref 0.0–0.2)

## 2018-06-25 LAB — GLUCOSE, CAPILLARY
Glucose-Capillary: 105 mg/dL — ABNORMAL HIGH (ref 70–99)
Glucose-Capillary: 107 mg/dL — ABNORMAL HIGH (ref 70–99)
Glucose-Capillary: 113 mg/dL — ABNORMAL HIGH (ref 70–99)
Glucose-Capillary: 117 mg/dL — ABNORMAL HIGH (ref 70–99)
Glucose-Capillary: 121 mg/dL — ABNORMAL HIGH (ref 70–99)
Glucose-Capillary: 125 mg/dL — ABNORMAL HIGH (ref 70–99)
Glucose-Capillary: 77 mg/dL (ref 70–99)

## 2018-06-25 LAB — CK: Total CK: 106 U/L (ref 49–397)

## 2018-06-25 LAB — MAGNESIUM: Magnesium: 2.1 mg/dL (ref 1.7–2.4)

## 2018-06-25 LAB — PROCALCITONIN: Procalcitonin: 0.12 ng/mL

## 2018-06-25 MED ORDER — ENOXAPARIN SODIUM 40 MG/0.4ML ~~LOC~~ SOLN
40.0000 mg | SUBCUTANEOUS | Status: DC
  Administered 2018-06-25 – 2018-07-07 (×13): 40 mg via SUBCUTANEOUS
  Filled 2018-06-25 (×13): qty 0.4

## 2018-06-25 MED ORDER — SODIUM CHLORIDE 0.9 % IV SOLN
2.0000 g | Freq: Three times a day (TID) | INTRAVENOUS | Status: DC
  Administered 2018-06-25 – 2018-06-27 (×7): 2 g via INTRAVENOUS
  Filled 2018-06-25 (×10): qty 2

## 2018-06-25 MED ORDER — LEVETIRACETAM 100 MG/ML PO SOLN
500.0000 mg | Freq: Two times a day (BID) | ORAL | Status: DC
  Administered 2018-06-25 – 2018-07-10 (×31): 500 mg
  Filled 2018-06-25 (×31): qty 5

## 2018-06-25 NOTE — Progress Notes (Signed)
RT notified me of patient desat to mid 80's and having light tan secretions when suctioned. Tube feeds turned off and Dr. Darrick Penna notified via [place]. Instructed via ELink RN, [facility], to keep TF off and reassess in the morning.

## 2018-06-25 NOTE — Progress Notes (Signed)
Assisted tele visit to patient with family Tanysha Quant RN  

## 2018-06-25 NOTE — Progress Notes (Signed)
Assisted tele visit to patient with partner-girlfriend.  Vandana Haman, Dixon Boos, RN

## 2018-06-25 NOTE — Progress Notes (Signed)
Patient placed patient on 28% ATC. Patient is tolerating well at this time. No complications. Vital signs stable. RN aware. RT will continue to monitor.

## 2018-06-25 NOTE — Progress Notes (Signed)
PULMONARY / CRITICAL CARE MEDICINE   NAME:  Jesse [name], MRN:  [id number], DOB:  Jul 03, 1990, LOS: 12 ADMISSION DATE:  06/13/2018, CONSULTATION DATE:  06/13/2018 REFERRING MD:  Pilar Plate, (ED), CHIEF COMPLAINT:  Altered MS  BRIEF HISTORY:    28 yo male found in car unresponsive.  Intubated for airway protection.  Neuro imaging shows changes of diffuse anoxic injury.  UDS positive for cocaine, benzo's, amphetamines.  SIGNIFICANT PAST MEDICAL HISTORY   Substance abuse, Depression with prior suicide attempt  SIGNIFICANT EVENTS:  4/20 Admit with AMS, intubated 4/30 Trach 5/01 Add abx for fever, HCAP  STUDIES:   MRI brain 4/20 >> multifocal diffusion abnormality within gray nuclei, b/l supratentorial white matter and b/l cerebellar hemispheres Echo 4/21 >> EF 55 to 60% EEG 4/22 >> background slowing  CULTURES:  Blood 4/20 > negative Trach aspirate 4/25 > negative HIV 4/20 > non reactive Covid 19 4/20 > Negative Urine 4/10 > No growth Sputum 4/30 > Few Staph aureus, Few Klebsiella  ANTIBIOTICS:  Unasyn 4/21 >> 4/27 Cefepime 5/02 >>   LINES/TUBES:  ETT 4/20 > 4/30 Trach 4/30 >   CONSULTANTS:  Neurology > s/o 4/27  SUBJECTIVE:  Continues to have fever.  OBJECTIVE:   CONSTITUTIONAL: BP 123/64   Pulse (!) 104   Temp (!) 101.6 F (38.7 C) (Axillary) Comment: Nurse notified  Resp 18   Ht 6' (1.829 m)   Wt 67.8 kg   SpO2 97%   BMI 20.28 kg/m   I/O last 3 completed shifts: In: 3237.9 [I.V.:2424.9; NG/GT:513; IV Piggyback:300] Out: 2485 [Urine:2485]     Vent Mode: CPAP;PSV FiO2 (%):  [40 %] 40 % Set Rate:  [16 bmp] 16 bmp Vt Set:  [620 mL] 620 mL PEEP:  [5 cmH20] 5 cmH20 Pressure Support:  [10 cmH20] 10 cmH20 Plateau Pressure:  [11 cmH20-18 cmH20] 16 cmH20  PHYSICAL EXAM:  General - unresponsive Eyes - pupils equal ENT - trach site clean Cardiac - regular rate/rhythm, no murmur Chest - scattered rhonchi Abdomen - soft, non tender, + bowel  sounds Extremities - no cyanosis, clubbing, or edema Skin - no rashes Neuro - vegetative state  CXR (reviewed by me) - b/l ATX/ASD  RESOLVED PROBLEM LIST  Acute renal failure with ATN, Elevated LFT's from Shock, Septic shock, Aspiration pneumonia  ASSESSMENT AND PLAN    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  There is also question of possible TBI. Plan - continue keppra, inderal - family trying to arrange for transfer to [facility] in [place]  Acute respiratory failure with hypoxia and inability to protect airway. Failure to wean s/p tracheostomy. Plan - pressure support to TC as able - trach care - prn albuterol - f/u CXR intermittently  Persistent Fever since 4/28 with Staph aureus and Klebsiella in sputum. Plan - add cefepime 5/01  Urine retention. Plan - continue Urecholine   Best Practice / Goals of Care / Disposition.   DVT PROPHYLAXIS: Lovenox SUP: protonix NUTRITION: tube feeds MOBILITY: bed rest GOALS OF CARE: full code DISPOSITION: ICU; will ask [facility] to be primary from 5/03 and PCCM follow for trach/vent  LABS:   CMP Latest Ref Rng & Units 06/25/2018 06/23/2018 06/21/2018  Glucose 70 - 99 mg/dL 782(N) 99 562(Z)  BUN 6 - 20 mg/dL 30(Q) 65(H) 17  Creatinine 0.61 - 1.24 mg/dL 8.46 9.62 9.52  Sodium 135 - 145 mmol/L 142 141 141  Potassium 3.5 - 5.1 mmol/L 4.0 4.3 3.7  Chloride 98 - 111 mmol/L 103 103  104  CO2 22 - 32 mmol/L 28 27 27   Calcium 8.9 - 10.3 mg/dL 9.3 16.110.0 9.1  Total Protein 6.5 - 8.1 g/dL 6.9 - -  Total Bilirubin 0.3 - 1.2 mg/dL 1.2 - -  Alkaline Phos 38 - 126 U/L 70 - -  AST 15 - 41 U/L 39 - -  ALT 0 - 44 U/L 77(H) - -   CBC Latest Ref Rng & Units 06/25/2018 06/22/2018 06/19/2018  WBC 4.0 - 10.5 K/uL 25.0(H) 14.5(H) 8.9  Hemoglobin 13.0 - 17.0 g/dL 12.3(L) 13.3 11.6(L)  Hematocrit 39.0 - 52.0 % 36.9(L) 38.4(L) 33.1(L)  Platelets 150 - 400 K/uL 460(H) 384 217   ABG    Component Value Date/Time   PHART 7.420 06/18/2018 0455    PCO2ART 42.7 06/18/2018 0455   PO2ART 173 (H) 06/18/2018 0455   HCO3 27.1 06/18/2018 0455   TCO2 23 06/13/2018 1811   ACIDBASEDEF 3.0 (H) 06/13/2018 1811   O2SAT 99.3 06/18/2018 0455   CBG (last 3)  Recent Labs    06/25/18 0024 06/25/18 0419 06/25/18 0805  GLUCAP 117* 121* 125*    Coralyn HellingVineet Brenn Deziel, MD [facility] 06/25/2018, 8:48 AM

## 2018-06-25 NOTE — Progress Notes (Signed)
Assisted tele visit to patient with family member.  Lathen Seal Ann, RN  

## 2018-06-25 NOTE — Progress Notes (Addendum)
Reason for consult: Anoxic injury  Subjective: Patient was trached on 4/30 . 5/1 abx added d/t patient being febrile     Examination  Vital signs in last 24 hours: Temp:  [99.2 F (37.3 C)-101.6 F (38.7 C)] 101.6 F (38.7 C) (05/02 0800) Pulse Rate:  [82-120] 104 (05/02 0808) Resp:  [12-27] 18 (05/02 0808) BP: (104-128)/(56-78) 123/64 (05/02 0808) SpO2:  [93 %-100 %] 97 % (05/02 0800) FiO2 (%):  [40 %] 40 % (05/02 0808)  General: lying in bed, trached CVS: pulse-normal rate and rhythm RS: breathing comfortably Extremities: normal; bilaterally praffo boots present and bilateral hand splints.    Neuro: UD:JSHF remained closed, PERRL 5 mm bilaterally,   CN: pupils equal and reactive,  EOMI, face appear symmetric.   motor: Extensor posturing with withdrawal in the right upper extremity Reflexes: 2+ bilaterally over patella,  plantars: flexor Coordination: cannot be tested Gait: not tested  Basic Metabolic Panel: Recent Labs  Lab 06/19/18 0440 06/21/18 0224 06/23/18 0457 06/25/18 0350  NA 145 141 141 142  K 3.4* 3.7 4.3 4.0  CL 108 104 103 103  CO2 27 27 27 28   GLUCOSE 106* 119* 99 102*  BUN 12 17 27* 29*  CREATININE 0.61 0.63 0.78 0.86  CALCIUM 8.9 9.1 10.0 9.3  MG 1.8 2.1  --  2.1    CBC: Recent Labs  Lab 06/19/18 0440 06/22/18 0327 06/25/18 0350  WBC 8.9 14.5* 25.0*  NEUTROABS  --   --  20.8*  HGB 11.6* 13.3 12.3*  HCT 33.1* 38.4* 36.9*  MCV 90.4 88.9 92.0  PLT 217 384 460*     Coagulation Studies: Recent Labs    06/23/18 0541  LABPROT 14.8  INR 1.2    Imaging No new brain imaging.  Cxray: stable atelectasis in bases.   ASSESSMENT AND PLAN 28 year old male with history of drug abuse and alcohol abuse found down in his running car with unknown downtime.  Intubated for airway protection.  Started on Keppra due to concern for seizure-like activity.  MRI brain reviewed: Shows restriction diffusion in bilateral globus pallidus concerning for  carbon monoxide poisoning/hypoxic injury. Patient's brainstem reflexes are intact, appears to track examiner slightly.  Grimaces to pain.  Not following any commands.  Has extensor posturing with withdrawal in the right upper extremity to noxious stimulus.     Hypoxic brain injury Possible carbon monoxide toxicity Drug overdose   Recommendations Continue Keppra Continue to treat metabolic disturbances Continue to watch for improvement  Patient could potentially improve over several weeks/months given relatively young age and intact brainstem reflexes.    Valentina Lucks, MSN, NP-C [facility] [phone]  NEUROHOSPITALIST ADDENDUM Performed a face to face diagnostic evaluation.   I have reviewed the contents of history and physical exam as documented by PA/ARNP/Resident and agree with above documentation.  I have discussed and formulated the above plan as documented. Edits to the note have been made as needed.  Patient with hypoxic injury/possible carbon monoxide poisoning, no further seizures.  Status post trach.  He tracks, follows commands intermittently according to nurse (not following commands for me), has Quadri paresis with spasticity.  Pending rehab.  Neurology will be available as needed.     Georgiana Spinner Kavya Haag MD [facility] [phone]   If 7pm to 7am, please call on call as listed on AMION.

## 2018-06-25 NOTE — Progress Notes (Signed)
Assisted tele visit to patient with partner-girlfriend.  Jesse Jacuinde Samson, RN  

## 2018-06-25 NOTE — Progress Notes (Signed)
Assisted tele visit to patient with mother and then at 2152 with the father.  Jesse [name], Dixon Boos, RN

## 2018-06-25 NOTE — Progress Notes (Signed)
Pharmacy Antibiotic Note  Jesse [name] is a 28 y.o. male admitted on 06/13/2018 with pneumonia.  Pharmacy has been consulted for Cefepime dosing.  Resp culture with MSSA and Klebsiella (sens pending). CrCl > 100 ml/min  Plan: Start Cefepime 2 grams IV q8hr Monitor renal function, C&S, and LOT  Height: 6' (182.9 cm) Weight: 149 lb 8 oz (67.8 kg) IBW/kg (Calculated) : 77.6  Temp (24hrs), Avg:100.2 F (37.9 C), Min:99.2 F (37.3 C), Max:101.6 F (38.7 C)  Recent Labs  Lab 06/19/18 0440 06/21/18 0224 06/22/18 0327 06/23/18 0457 06/25/18 0350  WBC 8.9  --  14.5*  --  25.0*  CREATININE 0.61 0.63  --  0.78 0.86    Estimated Creatinine Clearance: 125.9 mL/min (by C-G formula based on SCr of 0.86 mg/dL).    Allergies  Allergen Reactions  . Other Other (See Comments)    Seasonal allergies- Stuffy nose, itchy eyes, congestion, etc..    Antimicrobials this admission: Cefepime 5/2 >>   Thank you for allowing pharmacy to be a part of this patient's care.  Jeanella Cara, PharmD, [facility] Clinical Pharmacist Please see AMION for all Pharmacists' Contact Phone Numbers 06/25/2018, 9:02 AM

## 2018-06-26 DIAGNOSIS — J189 Pneumonia, unspecified organism: Secondary | ICD-10-CM

## 2018-06-26 DIAGNOSIS — Z930 Tracheostomy status: Secondary | ICD-10-CM

## 2018-06-26 DIAGNOSIS — Z515 Encounter for palliative care: Secondary | ICD-10-CM

## 2018-06-26 DIAGNOSIS — J9811 Atelectasis: Secondary | ICD-10-CM

## 2018-06-26 DIAGNOSIS — Z7189 Other specified counseling: Secondary | ICD-10-CM

## 2018-06-26 DIAGNOSIS — R579 Shock, unspecified: Secondary | ICD-10-CM

## 2018-06-26 DIAGNOSIS — T50901A Poisoning by unspecified drugs, medicaments and biological substances, accidental (unintentional), initial encounter: Secondary | ICD-10-CM

## 2018-06-26 LAB — BASIC METABOLIC PANEL
Anion gap: 11 (ref 5–15)
BUN: 25 mg/dL — ABNORMAL HIGH (ref 6–20)
CO2: 24 mmol/L (ref 22–32)
Calcium: 9.3 mg/dL (ref 8.9–10.3)
Chloride: 107 mmol/L (ref 98–111)
Creatinine, Ser: 0.66 mg/dL (ref 0.61–1.24)
GFR calc Af Amer: >60 mL/min (ref >60)
GFR calc non Af Amer: >60 mL/min (ref >60)
Glucose, Bld: 110 mg/dL — ABNORMAL HIGH (ref 70–99)
Potassium: 3.8 mmol/L (ref 3.5–5.1)
Sodium: 142 mmol/L (ref 135–145)

## 2018-06-26 LAB — CULTURE, RESPIRATORY W GRAM STAIN

## 2018-06-26 LAB — CBC
HCT: 36 % — ABNORMAL LOW (ref 39.0–52.0)
Hemoglobin: 12.1 g/dL — ABNORMAL LOW (ref 13.0–17.0)
MCH: 31 pg (ref 26.0–34.0)
MCHC: 33.6 g/dL (ref 30.0–36.0)
MCV: 92.3 fL (ref 80.0–100.0)
Platelets: 480 10*3/uL — ABNORMAL HIGH (ref 150–400)
RBC: 3.9 MIL/uL — ABNORMAL LOW (ref 4.22–5.81)
RDW: 12.5 % (ref 11.5–15.5)
WBC: 17.1 10*3/uL — ABNORMAL HIGH (ref 4.0–10.5)
nRBC: 0 % (ref 0.0–0.2)

## 2018-06-26 LAB — GLUCOSE, CAPILLARY
Glucose-Capillary: 116 mg/dL — ABNORMAL HIGH (ref 70–99)
Glucose-Capillary: 112 mg/dL — ABNORMAL HIGH (ref 70–99)
Glucose-Capillary: 110 mg/dL — ABNORMAL HIGH (ref 70–99)
Glucose-Capillary: 103 mg/dL — ABNORMAL HIGH (ref 70–99)
Glucose-Capillary: 111 mg/dL — ABNORMAL HIGH (ref 70–99)
Glucose-Capillary: 147 mg/dL — ABNORMAL HIGH (ref 70–99)

## 2018-06-26 LAB — CULTURE, RESPIRATORY

## 2018-06-26 MED ORDER — IPRATROPIUM-ALBUTEROL 0.5-2.5 (3) MG/3ML IN SOLN: 3.0000 mL | Freq: Three times a day (TID) | RESPIRATORY_TRACT | Status: DC

## 2018-06-26 MED ORDER — IPRATROPIUM-ALBUTEROL 0.5-2.5 (3) MG/3ML IN SOLN
3.0000 mL | Freq: Three times a day (TID) | RESPIRATORY_TRACT | Status: DC
  Administered 2018-06-26 – 2018-07-07 (×34): 3 mL via RESPIRATORY_TRACT
  Filled 2018-06-26 (×34): qty 3

## 2018-06-26 MED ORDER — METHYLPREDNISOLONE SODIUM SUCC 125 MG IJ SOLR
60.0000 mg | INTRAMUSCULAR | Status: DC
  Administered 2018-06-26: 60 mg via INTRAVENOUS
  Filled 2018-06-26: qty 2

## 2018-06-26 NOTE — Progress Notes (Signed)
Assisted tele visit to patient with family.  Rynn Markiewicz P, RN  
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
## 2018-06-26 NOTE — Progress Notes (Signed)
Assisted tele visit to patient with family.  Renae Mottley P, RN  

## 2018-06-26 NOTE — Progress Notes (Signed)
Assisted tele visit to patient with family member.  Zarina Pe Samson, RN  
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
## 2018-06-26 NOTE — Progress Notes (Signed)
PROGRESS NOTE    Jesse [name]  MBT:[id number] DOB: 12-Nov-1990 DOA: 06/13/2018 PCP: No primary care provider on file.   Brief Narrative:  28 yo WM PMHx suicide attempt, depression, substance abuse  Found in car unresponsive. Intubated for airway protection. Neuro imaging shows changes of diffuse anoxic injury. UDS positive for cocaine, benzo's, amphetamines.     Subjective: 5/3 eyes open responsive only to painful stimuli.   Assessment & Plan:   Active Problems:   Altered mental status   Acute respiratory failure with hypoxia (HCC)   Status post tracheostomy ([place])  Acute respiratory failure with hypoxia/inability to protect airway -Multifactorial drug overdose, anoxic encephalopathy, pneumonia -Treat underlying factors - Continue trach collar weaning trials - Titrate O2 to maintain SPO2> 92%  HCAP positive Staph aureus and Klebsiella -Complete 7-day course antibiotics -DuoNeb QID - Solu-Medrol 60 mg daily x3 days - Temp last 24 hours 39.1 C  Acute anoxic encephalopathy/TBI?/Carbon monoxide toxicity? -Neurology consulted -  Keppra 500 mg twice daily -Propranolol 20 mg daily - Family to arrange transfer to [facility] in [place]   Persistent fever - Could be multifactorial to include anoxic brain injury,HCAP.  Currently favor infection given his elevated WBC will monitor closely now that appropriate antibiotics started  Urine retention -Urecholine 10 mg 3 times daily  Dysphasia - Continue current tube feeds - Will require PEG tube prior to discharge to [facility], therefore will need new discussion with family.    Goals of care - 5/3 palliative care consult: polysubstance abuse (cocaine, THC, EtOH)/overdose.  Transition patient to DNR,    DVT prophylaxis: Lovenox  Code Status: Full Family Communication: None Disposition Plan: TBD   Consultants:  Neurology PCCM Palliative care    Procedures/Significant Events:  4/20 Admit with AMS,  intubated MRI brain 4/20 >> multifocal diffusion abnormality within gray nuclei, b/l supratentorial white matter and b/l cerebellar hemispheres Echo 4/21 >> EF 55 to 60% EEG 4/22 >> background slowing 4/30 Trach 5/01 Add abx for fever, HCAP; off vent    I have personally reviewed and interpreted all radiology studies and my findings are as above.  VENTILATOR SETTINGS: Trach collar: FiO2: 28%   Cultures Blood 4/20 > negative Trach aspirate 4/25> negative HIV 4/20 > non reactive Covid 19 4/20 > Negative Urine 4/10 > No growth Sputum 4/30 > positive few Staph aureus, Few Klebsiella   Antimicrobials: Anti-infectives (From admission, onward)   Start     Stop   06/25/18 0900  ceFEPIme (MAXIPIME) 2 g in sodium chloride 0.9 % 100 mL IVPB         06/14/18 1100  Ampicillin-Sulbactam (UNASYN) 3 g in sodium chloride 0.9 % 100 mL IVPB     06/20/18 1858       Devices ETT 4/20 > 4/30 Tracheostomy 4/30>>   LINES / TUBES:      Continuous Infusions: . sodium chloride 250 mL (06/23/18 2049)  . ceFEPime (MAXIPIME) IV 2 g (06/26/18 0541)  . feeding supplement (VITAL 1.5 CAL) 1,000 mL (06/26/18 0629)     Objective: Vitals:   06/26/18 0400 06/26/18 0500 06/26/18 0600 06/26/18 0700  BP: 128/67 129/80 131/81 126/68  Pulse: 82 91 94 87  Resp: 19 18 (!) 22 (!) 21  Temp:      TempSrc:      SpO2: 94% 96% 90% 95%  Weight:  59.5 kg    Height:        Intake/Output Summary (Last 24 hours) at 06/26/2018 0840 Last data filed  at 06/26/2018 1914 Gross per 24 hour  Intake 1921.68 ml  Output 1250 ml  Net 671.68 ml   Filed Weights   06/23/18 0500 06/24/18 0500 06/26/18 0500  Weight: 83.9 kg 67.8 kg 59.5 kg    Examination:  General: A/O x0 positive acute respiratory distress Eyes: negative scleral hemorrhage, negative anisocoria, negative icterus ENT: Negative Runny nose, negative gingival bleeding, Neck:  Negative scars, masses, torticollis, lymphadenopathy, JVD, #8 cuffed  trach in place negative sign of infection or bleeding Lungs: Clear to auscultation bilaterally without wheezes or crackles Cardiovascular: Regular rate and rhythm without murmur gallop or rub normal S1 and S2 Abdomen: negative abdominal pain, nondistended, positive soft, bowel sounds, no rebound, no ascites, no appreciable mass Extremities: No significant cyanosis, clubbing, or edema bilateral lower extremities Skin: Negative rashes, lesions, ulcers Psychiatric: Weight secondary to altered mental status and being trached.   Central nervous system: Eyes open, withdraws to painful stimuli  .     Data Reviewed: Care during the described time interval was provided by me .  I have reviewed this patient's available data, including medical history, events of note, physical examination, and all test results as part of my evaluation.   CBC: Recent Labs  Lab 06/22/18 0327 06/25/18 0350 06/26/18 0435  WBC 14.5* 25.0* 17.1*  NEUTROABS  --  20.8*  --   HGB 13.3 12.3* 12.1*  HCT 38.4* 36.9* 36.0*  MCV 88.9 92.0 92.3  PLT 384 460* 782*   Basic Metabolic Panel: Recent Labs  Lab 06/21/18 0224 06/23/18 0457 06/25/18 0350 06/26/18 0435  NA 141 141 142 142  K 3.7 4.3 4.0 3.8  CL 104 103 103 107  CO2 27 27 28 24   GLUCOSE 119* 99 102* 110*  BUN 17 27* 29* 25*  CREATININE 0.63 0.78 0.86 0.66  CALCIUM 9.1 10.0 9.3 9.3  MG 2.1  --  2.1  --    GFR: Estimated Creatinine Clearance: 118.8 mL/min (by C-G formula based on SCr of 0.66 mg/dL). Liver Function Tests: Recent Labs  Lab 06/25/18 0350  AST 39  ALT 77*  ALKPHOS 70  BILITOT 1.2  PROT 6.9  ALBUMIN 3.1*   No results for input(s): LIPASE, AMYLASE in the last 168 hours. No results for input(s): AMMONIA in the last 168 hours. Coagulation Profile: Recent Labs  Lab 06/23/18 0541  INR 1.2   Cardiac Enzymes: Recent Labs  Lab 06/25/18 0350  CKTOTAL 106   BNP (last 3 results) No results for input(s): PROBNP in the last 8760  hours. HbA1C: No results for input(s): HGBA1C in the last 72 hours. CBG: Recent Labs  Lab 06/25/18 1552 06/25/18 1927 06/25/18 2312 06/26/18 0313 06/26/18 0724  GLUCAP 105* 77 107* 116* 112*   Lipid Profile: No results for input(s): CHOL, HDL, LDLCALC, TRIG, CHOLHDL, LDLDIRECT in the last 72 hours. Thyroid Function Tests: No results for input(s): TSH, T4TOTAL, FREET4, T3FREE, THYROIDAB in the last 72 hours. Anemia Panel: No results for input(s): VITAMINB12, FOLATE, FERRITIN, TIBC, IRON, RETICCTPCT in the last 72 hours. Urine analysis:    Component Value Date/Time   COLORURINE YELLOW 06/13/2018 [place] 06/13/2018 1719   LABSPEC 1.011 06/13/2018 1719   PHURINE 6.0 06/13/2018 1719   GLUCOSEU NEGATIVE 06/13/2018 1719   HGBUR LARGE (A) 06/13/2018 1719   BILIRUBINUR NEGATIVE 06/13/2018 1719   KETONESUR NEGATIVE 06/13/2018 1719   PROTEINUR 100 (A) 06/13/2018 1719   NITRITE NEGATIVE 06/13/2018 1719   LEUKOCYTESUR NEGATIVE 06/13/2018 1719   Sepsis  Labs: @LABRCNTIP (procalcitonin:4,lacticidven:4)  ) Recent Results (from the past 240 hour(s))  Culture, respiratory (non-expectorated)     Status: None   Collection Time: 06/18/18 10:24 AM  Result Value Ref Range Status   Specimen Description TRACHEAL ASPIRATE  Final   Special Requests Normal  Final   Gram Stain   Final    RARE WBC PRESENT, PREDOMINANTLY PMN NO ORGANISMS SEEN    Culture   Final    RARE Consistent with normal respiratory flora. Performed at [facility], [place] [address] [place], [place] [address]    Report Status 06/20/2018 FINAL  Final  Culture, respiratory (non-expectorated)     Status: None   Collection Time: 06/23/18  3:32 PM  Result Value Ref Range Status   Specimen Description TRACHEAL ASPIRATE  Final   Special Requests NONE  Final   Gram Stain   Final    ABUNDANT WBC PRESENT,BOTH PMN AND MONONUCLEAR MODERATE GRAM POSITIVE COCCI Performed at [facility], [address] [place], [place] [address]    Culture   Final    FEW STAPHYLOCOCCUS AUREUS FEW KLEBSIELLA PNEUMONIAE Confirmed Extended Spectrum Beta-Lactamase Producer (ESBL).  In bloodstream infections from ESBL organisms, carbapenems are preferred over piperacillin/tazobactam. They are shown to have a lower risk of mortality.    Report Status 06/26/2018 FINAL  Final   Organism ID, Bacteria STAPHYLOCOCCUS AUREUS  Final   Organism ID, Bacteria KLEBSIELLA PNEUMONIAE  Final      Susceptibility   Klebsiella pneumoniae - MIC*    AMPICILLIN >=32 RESISTANT Resistant     CEFAZOLIN >=64 RESISTANT Resistant     CEFEPIME 2 SENSITIVE Sensitive     CEFTAZIDIME 16 INTERMEDIATE Intermediate     CEFTRIAXONE >=64 RESISTANT Resistant     CIPROFLOXACIN 1 SENSITIVE Sensitive     GENTAMICIN <=1 SENSITIVE Sensitive     IMIPENEM <=0.25 SENSITIVE Sensitive     TRIMETH/SULFA >=320 RESISTANT Resistant     AMPICILLIN/SULBACTAM >=32 RESISTANT Resistant     PIP/TAZO 16 SENSITIVE Sensitive     Extended ESBL POSITIVE Resistant     * FEW KLEBSIELLA PNEUMONIAE   Staphylococcus aureus - MIC*    CIPROFLOXACIN <=0.5 SENSITIVE Sensitive     ERYTHROMYCIN >=8 RESISTANT Resistant     GENTAMICIN <=0.5 SENSITIVE Sensitive     OXACILLIN <=0.25 SENSITIVE Sensitive     TETRACYCLINE <=1 SENSITIVE Sensitive     VANCOMYCIN 1 SENSITIVE Sensitive     TRIMETH/SULFA <=10 SENSITIVE Sensitive     CLINDAMYCIN RESISTANT Resistant     RIFAMPIN <=0.5 SENSITIVE Sensitive     Inducible Clindamycin POSITIVE Resistant     * FEW STAPHYLOCOCCUS AUREUS         Radiology Studies: Dg Chest Port 1 View  Result Date: 06/25/2018 CLINICAL DATA:  Respiratory failure EXAM: PORTABLE CHEST 1 VIEW COMPARISON:  Yesterday FINDINGS: Tracheostomy tube in place. There is a feeding tube which at least reaches the stomach. Streaky densities at the bases. No edema, effusion, or pneumothorax. IMPRESSION: Stable atelectasis at the bases. Electronically Signed   By:  Monte Fantasia M.D.   On: 06/25/2018 07:34        Scheduled Meds: . bethanechol  10 mg Per Tube TID  . chlorhexidine gluconate (MEDLINE KIT)  15 mL Mouth Rinse BID  . enoxaparin (LOVENOX) injection  40 mg Subcutaneous Q24H  . feeding supplement (PRO-STAT SUGAR FREE 64)  30 mL Per Tube Daily  . levETIRAcetam  500 mg Per Tube BID  . mouth  rinse  15 mL Mouth Rinse 10 times per day  . propranolol  20 mg Per Tube Q2000   Continuous Infusions: . sodium chloride 250 mL (06/23/18 2049)  . ceFEPime (MAXIPIME) IV 2 g (06/26/18 0541)  . feeding supplement (VITAL 1.5 CAL) 1,000 mL (06/26/18 0629)     LOS: 13 days   The patient is critically ill with multiple organ systems failure and requires high complexity decision making for assessment and support, frequent evaluation and titration of therapies, application of advanced monitoring technologies and extensive interpretation of multiple databases. Critical Care Time devoted to patient care services described in this note  Time spent: 40 minutes     , Geraldo Docker, MD [facility] Pager [phone]  If 7PM-7AM, please contact night-coverage [email or web address] Password [email or web address] 06/26/2018, 8:40 AM

## 2018-06-27 LAB — GLUCOSE, CAPILLARY
Glucose-Capillary: 106 mg/dL — ABNORMAL HIGH (ref 70–99)
Glucose-Capillary: 107 mg/dL — ABNORMAL HIGH (ref 70–99)
Glucose-Capillary: 109 mg/dL — ABNORMAL HIGH (ref 70–99)
Glucose-Capillary: 123 mg/dL — ABNORMAL HIGH (ref 70–99)
Glucose-Capillary: 142 mg/dL — ABNORMAL HIGH (ref 70–99)
Glucose-Capillary: 90 mg/dL (ref 70–99)

## 2018-06-27 MED ORDER — SODIUM CHLORIDE 0.9 % IV SOLN
2.0000 g | Freq: Three times a day (TID) | INTRAVENOUS | Status: AC
  Administered 2018-06-27 – 2018-07-05 (×24): 2 g via INTRAVENOUS
  Filled 2018-06-27 (×28): qty 2

## 2018-06-27 NOTE — Progress Notes (Signed)
Assisted tele visit to patient with family Matty Deamer RN  

## 2018-06-27 NOTE — Progress Notes (Signed)
PULMONARY / CRITICAL CARE MEDICINE   NAME:  Jesse [name], MRN:  [id number], DOB:  [birth year], LOS: 14 ADMISSION DATE:  06/13/2018, CONSULTATION DATE:  06/13/2018 REFERRING MD:  Pilar Plate, (ED), CHIEF COMPLAINT:  Altered MS  BRIEF HISTORY:    28 yo male found in car unresponsive.  Intubated for airway protection.  Neuro imaging shows changes of diffuse anoxic injury.  UDS positive for cocaine, benzo's, amphetamines.  SIGNIFICANT PAST MEDICAL HISTORY   Substance abuse, Depression with prior suicide attempt  SIGNIFICANT EVENTS:  4/20 Admit with AMS, intubated 4/30 Trach 5/01 Add abx for fever, HCAP 5/4: Been off ventilator all weekend.  Seemingly more interactive. STUDIES:   MRI brain 4/20 >> multifocal diffusion abnormality within gray nuclei, b/l supratentorial white matter and b/l cerebellar hemispheres Echo 4/21 >> EF 55 to 60% EEG 4/22 >> background slowing  CULTURES:  Blood 4/20 > negative Trach aspirate 4/25 > negative HIV 4/20 > non reactive Covid 19 4/20 > Negative Urine 4/10 > No growth Sputum 4/30 > Few Staph aureus, Few Klebsiella  ANTIBIOTICS:  Unasyn 4/21 >> 4/27 Cefepime 5/02 >>   LINES/TUBES:  ETT 4/20 > 4/30 Trach 4/30 >   CONSULTANTS:  Neurology > s/o 4/27  SUBJECTIVE:  Fever curve improved.  White blood cell count coming down. OBJECTIVE:   CONSTITUTIONAL: Blood Pressure 133/85 (BP Location: Right Arm)   Pulse 88   Temperature 99.4 F (37.4 C) (Oral)   Respiration 12   Height 6' (1.829 m)   Weight 59.5 kg   Oxygen Saturation 95%   Body Mass Index 17.79 kg/m   I/O last 3 completed shifts: In: 2710 [NG/GT:2210; IV Piggyback:500] Out: 2310 [Urine:2310]     FiO2 (%):  [28 %] 28 %  PHYSICAL EXAM:  General: Is a well nourished well-developed 28 year old male patient remains tracheostomy dependent HEENT normocephalic atraumatic size 8 tracheostomy is unremarkable Neuro: Seemingly more interactive today.  Tracking in room.  Not following  commands.  Moving extremities spontaneously. Pulmonary: Coarse scattered rhonchi no accessory use on aerosol trach collar Cardiac: Regular rate and rhythm without murmur rub or gallop Abdomen: Soft not tender no organomegaly positive bowel sounds Extremities: Warm and dry brisk capillary refill no edema GU Foley catheter in place  RESOLVED PROBLEM LIST  Acute renal failure with ATN, Elevated LFT's from Shock, Septic shock, Aspiration pneumonia  ASSESSMENT AND PLAN    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  There is also question of possible TBI. Plan Continue Keppra and Inderal  Family working towards [facility] in [place]    Acute respiratory failure with hypoxia and inability to protect airway. Failure to wean s/p tracheostomy. Has remained off ventilator Plan Discontinue ventilator from room Routine tracheostomy care  Hcap with Staph aureus and Klebsiella in sputum. Plan Day 3 cefepime  Urine retention. Plan Continue Urecholine   Best Practice / Goals of Care / Disposition.   DVT PROPHYLAXIS: Lovenox SUP: protonix NUTRITION: tube feeds MOBILITY: bed rest GOALS OF CARE: full code DISPOSITION: Moving ventilator out of room.  Continue antibiotics.  Continue to look for placement hopefully [facility] in [facility] ACNP-BC [facility] Pulmonary/Critical Care Pager # [phone] OR # [phone] if no answer

## 2018-06-27 NOTE — Progress Notes (Signed)
Assisted tele visit to patient with mother.  Rayden Scheper P, RN   

## 2018-06-27 NOTE — Progress Notes (Signed)
Patient positive for ESBL in tracheal aspirate. Contact precautions initiated and NP made aware. Analucia Hush, [place] Scrape, RN

## 2018-06-27 NOTE — Progress Notes (Signed)
Assisted tele visit to patient with girlfriend.   Thomasenia Bottoms, RN

## 2018-06-27 NOTE — Progress Notes (Signed)
Palliative Medicine RN Note: At the request of weekend PMT coverage, I followed up to see if there is any way for family to visit. Due to restrictions r/t COVID-19, there is not a way for family to visit. I have let both of Jefferey's parents know.  Jesse Chance Lukus Binion, RN, BSN, [facility] Palliative Medicine Team 06/27/2018 12:45 PM Office [phone]

## 2018-06-27 NOTE — Progress Notes (Signed)
Spoke with mother and girlfriend this am with updates. They are excited about his improvements. GF is set to have an [employer] video call at [phone]. I assured them I would call with any changes. Zackariah Vanderpol, [place] Scrape, RN

## 2018-06-27 NOTE — Progress Notes (Signed)
Assisted tele visit to patient with family.  Yorel Redder P, RN  

## 2018-06-27 NOTE — Progress Notes (Signed)
Spoke with patient's mother for a second time today and scheduled a 1300 [facility] video chat. She was updated on pt's contact precaution status. Arly Salminen, [place] Scrape, RN

## 2018-06-27 NOTE — Care Management (Signed)
Noted pt's significant improvements with therapy team.  Spoke with Annia Belt, admissions coordinator for [facility] in [place], and faxed referral to her for review.  Will provide updates as they are available.    Quintella Baton, RN, BSN  Trauma/Neuro ICU Case Manager [phone]

## 2018-06-27 NOTE — Progress Notes (Signed)
Assisted tele visit to patient with father.  Kaylynne Andres P, RN  

## 2018-06-27 NOTE — Progress Notes (Signed)
PROGRESS NOTE    Jesse [name]  MRN:[id number] DOB: 03/07/1990 DOA: 06/13/2018 PCP: No primary care provider on file.    Brief Narrative:  28-year-old male who was found down in his vehicle, unresponsive, suspected postictal.  He had several unmarked pills around him.  Patient did not respond to naloxone.  While at the emergency department he required intubation for airway protection.  Upon admission to the intensive care unit he was hypotensive, required vasopressors.  He had a prolonged hospital stay, he was diagnosed with staph aureus and Klebsiella pneumonia.  He continued to be encephalopathic, possible anoxic injury.  He required tracheostomy April 30, in order  to be liberated from mechanical ventilation.  Assessment & Plan:   Active Problems:   Altered mental status   Acute respiratory failure with hypoxia (HCC)   Status post tracheostomy (HCC)   Drug overdose   Shock (HCC)   Goals of care, counseling/discussion   Palliative care by specialist   1. Acute hypoxic and hypercapnic respiratory failure due to encephalopathy, possible hypoxic brain injury. Patient now on trach collar and oxygenating well, continue to have NG tube for feedings. He is not verbal but tracks with his eyes. Not in apparent pain or dyspnea. Will continue keppra for seizure prophylaxis.   2. Pneumonia (not present on admission) Patient with antibiotic therapy with meropenem. Continue oxymetry monitoring and aspiration precautions. Will dc steroids.   3. Urinary retention. Foley catheter in place.    DVT prophylaxis: enoxaparin   Code Status:  full Family Communication: no family at the bedside  Disposition Plan/ discharge barriers: [name], [name].   Body mass index is 17.79 kg/m. Malnutrition Type:  Nutrition Problem: Inadequate oral intake Etiology: inability to eat   Malnutrition Characteristics:  Signs/Symptoms: NPO status   Nutrition  Interventions:  Interventions: Refer to RD note for recommendations  RN Pressure Injury Documentation:     Consultants:   Neurology   Procedures:     Antimicrobials:   Cefepime     Subjective: Patient is not verbal, track with his eyes, not in apparent pain or dyspnea.   Objective: Vitals:   06/27/18 1100 06/27/18 1108 06/27/18 1200 06/27/18 1209  BP:  137/79 132/70   Pulse: 77 76 72   Resp: 17 15 17   Temp:    98.4 F (36.9 C)  TempSrc:    Axillary  SpO2: 98% 98% 97%   Weight:      Height:        Intake/Output Summary (Last 24 hours) at 06/27/2018 1234 Last data filed at 06/27/2018 1200 Gross per 24 hour  Intake 1860 ml  Output 960 ml  Net 900 ml   Filed Weights   06/23/18 0500 06/24/18 0500 06/26/18 0500  Weight: 83.9 kg 67.8 kg 59.5 kg    Examination:   General: Not in pain or dyspnea Neurology: Awake, not verbal, paresis of all 4 extremities.  E ENT: no pallor, no icterus, oral mucosa moist/ trach in place/ NG tube in place.  Cardiovascular: No JVD. S1-S2 present, rhythmic, no gallops, rubs, or murmurs. No lower extremity edema. Pulmonary: positive breath sounds bilaterally, adequate air movement, no wheezing, rhonchi or rales. Gastrointestinal. Abdomen with no organomegaly, non tender, no rebound or guarding Skin. No rashes Musculoskeletal: no joint deformities     Data Reviewed: I have personally reviewed following labs and imaging studies  CBC: Recent Labs  Lab 06/22/18 0327 06/25/18 0350 06/26/18 0435  WBC 14.5* 25.0*   17.1*  NEUTROABS  --  20.8*  --   HGB 13.3 12.3* 12.1*  HCT 38.4* 36.9* 36.0*  MCV 88.9 92.0 92.3  PLT 384 460* 185*   Basic Metabolic Panel: Recent Labs  Lab 06/21/18 0224 06/23/18 0457 06/25/18 0350 06/26/18 0435  NA 141 141 142 142  K 3.7 4.3 4.0 3.8  CL 104 103 103 107  CO2 _0 GLUCOSE 119* 99 102* 110*  BUN 17 27* 29* 25*  CREATININE 0.63 0.78 0.86 0.66  CALCIUM 9.1 10.0 9.3 9.3  MG 2.1  --   2.1  --    GFR: Estimated Creatinine Clearance: 118.8 mL/min (by C-G formula based on SCr of 0.66 mg/dL). Liver Function Tests: Recent Labs  Lab 06/25/18 0350  AST 39  ALT 77*  ALKPHOS 70  BILITOT 1.2  PROT 6.9  ALBUMIN 3.1*   No results for input(s): LIPASE, AMYLASE in the last 168 hours. No results for input(s): AMMONIA in the last 168 hours. Coagulation Profile: Recent Labs  Lab 06/23/18 0541  INR 1.2   Cardiac Enzymes: Recent Labs  Lab 06/25/18 0350  CKTOTAL 106   BNP (last 3 results) No results for input(s): PROBNP in the last 8760 hours. HbA1C: No results for input(s): HGBA1C in the last 72 hours. CBG: Recent Labs  Lab 06/26/18 1922 06/26/18 2316 06/27/18 0347 06/27/18 0809 06/27/18 1207  GLUCAP 111* 147* 142* 123* 106*   Lipid Profile: No results for input(s): CHOL, HDL, LDLCALC, TRIG, CHOLHDL, LDLDIRECT in the last 72 hours. Thyroid Function Tests: No results for input(s): TSH, T4TOTAL, FREET4, T3FREE, THYROIDAB in the last 72 hours. Anemia Panel: No results for input(s): VITAMINB12, FOLATE, FERRITIN, TIBC, IRON, RETICCTPCT in the last 72 hours.    Radiology Studies: I have reviewed all of the imaging during this hospital visit personally     Scheduled Meds: . bethanechol  10 mg Per Tube TID  . chlorhexidine gluconate (MEDLINE KIT)  15 mL Mouth Rinse BID  . enoxaparin (LOVENOX) injection  40 mg Subcutaneous Q24H  . feeding supplement (PRO-STAT SUGAR FREE 64)  30 mL Per Tube Daily  . ipratropium-albuterol  3 mL Nebulization TID  . levETIRAcetam  500 mg Per Tube BID  . mouth rinse  15 mL Mouth Rinse 10 times per day  . methylPREDNISolone (SOLU-MEDROL) injection  60 mg Intravenous Q24H  . propranolol  20 mg Per Tube Q2000   Continuous Infusions: . sodium chloride 250 mL (06/27/18 0557)  . feeding supplement (VITAL 1.5 CAL) 1,000 mL (06/26/18 2308)  . meropenem (MERREM) IV 2 g (06/27/18 1221)     LOS: 14 days        Leeon Makar Gerome Apley, MD

## 2018-06-27 NOTE — Progress Notes (Signed)
Physical Therapy Treatment Patient Details Name: Jesse [name] MRN: [id number] DOB: May 05, 1990 Today's Date: 06/27/2018    History of Present Illness 28 year old male found down in car with suspected drug overdose versus status epilepticus. Intubated 4/20. MRI showing multiple areas of restricted diffusion, predominantly in the cerebellar hemispheres which might be representative of hypoxic/anoxic injury but specifically in bilateral globus pallidus and lentiform nuclei on the right. No significant medical past history but history of alcohol and drug abuse.     PT Comments    Patient seen for brain injury team progression. Patient with significant improvements compared to previous session. Patient tolerated supine, EOB and standing activities. Patient showing isolated functional and purposeful use of RLE, some intermittent isolated movement of RUE, trace activation in left. Patient completely engaged during session with sustained attention and improvements in communication abilities with some evidence of consistent and appropriate alternative communication for indication of yes/no response via blinking. Patient also demonstrated improvements over the course of session with standing activities progressing from 2 person max to 2 person moderate assist. Will increased patient frequency and change recommendations to reflect need for comprehensive inpatient rehabilitation now that patient demonstrating strong engagement and improvements. Will continue to see and progress as tolerated.   OF NOTE: VSS throughout session on ATC.  Follow Up Recommendations  CIR     Equipment Recommendations  (TBD)    Recommendations for Other Services Rehab consult     Precautions / Restrictions Precautions Precautions: Fall Precaution Comments: Pt trached  Restrictions Weight Bearing Restrictions: No    Mobility  Bed Mobility Overal bed mobility: Needs Assistance Bed Mobility: Supine to Sit;Sit to  Supine;Rolling     Supine to sit: Max assist;+2 for physical assistance Sit to supine: Max assist;+2 for physical assistance   General bed mobility comments: Patient cued to bring RLE off EOB and was able to do this appropriately upon command, patient also with some noted trunk engagement and purposeful movement to EOB, max assist to elevate trunk to upright and rotrate hips to EOB. Upon return to supine, patient aable to elevate RLE to bed, assist to control descent and reposition  Transfers Overall transfer level: Needs assistance   Transfers: Sit to/from Stand Sit to Stand: Mod assist;Max assist         General transfer comment: Patient able to initiate power up through right side with assist, LLE blocked at the knee for stability, multi modal cues for elevation of trunk, chest and head. Facilitation and VCs effective with increased time. Patient able to static stand with progression from Max to moderate assist with good activation of proximal stabilizing musculature.   Ambulation/Gait                 Stairs             Wheelchair Mobility    Modified Rankin (Stroke Patients Only) Modified Rankin (Stroke Patients Only) Pre-Morbid Rankin Score: No symptoms Modified Rankin: Severe disability     Balance Overall balance assessment: Needs assistance Sitting-balance support: Feet supported;Bilateral upper extremity supported Sitting balance-Leahy Scale: Poor Sitting balance - Comments: required assist at EOB but demonstrates active trunk and cervical control with cues. Patient able to engage in EOB LE activity in sitting with posterior support. At times with noted UE purposeful support on from RUE. Postural control: Posterior lean Standing balance support: During functional activity Standing balance-Leahy Scale: Poor Standing balance comment: 2 person physical assist, progressed from max assist to moderate assist with improvements in postural  alignment and head  control with verbal cues.                            Cognition Arousal/Alertness: Awake/alert Behavior During Therapy: Flat affect Overall Cognitive Status: Impaired/Different from baseline Area of Impairment: Attention;Following commands;Awareness;Problem solving                   Current Attention Level: Sustained   Following Commands: Follows one step commands consistently;Follows one step commands with increased time   Awareness: Intellectual Problem Solving: Slow processing;Requires verbal cues;Requires tactile cues General Comments: Patient very engaged in session and following simple commands consistently in varying positions (supine, EOB, and Standing). Patient also able to establish consistent response for expression of yes/no via eye blink.       Exercises Other Exercises Other Exercises: Long arc quads at EOB, R LE AROM full range against gravity 3+, trace activation with PROM LLE x5 Other Exercises: Trunk rotation stretch at EOB Other Exercises: Cervical ROM activity at EOB    General Comments        Pertinent Vitals/Pain Faces Pain Scale: No hurt    Home Living                      Prior Function            PT Goals (current goals can now be found in the care plan section) Acute Rehab PT Goals Patient Stated Goal: Unstated Progress towards PT goals: Progressing toward goals    Frequency    Min 4X/week      PT Plan Discharge plan needs to be updated    Co-evaluation PT/OT/SLP Co-Evaluation/Treatment: Yes Reason for Co-Treatment: Complexity of the patient's impairments (multi-system involvement) PT goals addressed during session: Mobility/safety with mobility OT goals addressed during session: Strengthening/ROM      AM-PAC PT "6 Clicks" Mobility   Outcome Measure  Help needed turning from your back to your side while in a flat bed without using bedrails?: A Lot Help needed moving from lying on your back to sitting  on the side of a flat bed without using bedrails?: A Lot Help needed moving to and from a bed to a chair (including a wheelchair)?: A Lot Help needed standing up from a chair using your arms (e.g., wheelchair or bedside chair)?: A Lot Help needed to walk in hospital room?: Total Help needed climbing 3-5 steps with a railing? : Total 6 Click Score: 10    End of Session Equipment Utilized During Treatment: (ATC) Activity Tolerance: Patient tolerated treatment well Patient left: in bed;with call bell/phone within reach;with bed alarm set;with SCD's reapplied Nurse Communication: Mobility status PT Visit Diagnosis: Other symptoms and signs involving the nervous system (N56.213(R29.898)     Time: 0865-7846: 1508-1544 PT Time Calculation (min) (ACUTE ONLY): 36 min  Charges:  $Therapeutic Activity: 8-22 mins                     Charlotte Crumbevon Edward Guthmiller, PT DPT  Board Certified Neurologic Specialist Acute Rehabilitation Services Pager [phone] Office [phone]    Fabio AsaDevon J Yun Gutierrez 06/27/2018, 5:07 PM

## 2018-06-27 NOTE — Progress Notes (Signed)
Pharmacy Antibiotic Note  Jesse [name] is a 28 y.o. male admitted on 06/13/2018 with pneumonia.  Pharmacy has been consulted for meropenem dosing. Patient currently on cefepime.  Resp culture with MSSA and Klebsiella. Klebsiella resulted ESBL, however, sensitive to cefepime (MIC 2). CCM wants to start meropenem (S). CrCl > 100 ml/min  Plan: Stop Cefepime Start meropenem 2g q8h until 5/12  Monitor renal function  Height: 6' (182.9 cm) Weight: 131 lb 3.2 oz (59.5 kg) IBW/kg (Calculated) : 77.6  Temp (24hrs), Avg:99.2 F (37.3 C), Min:98.4 F (36.9 C), Max:99.9 F (37.7 C)  Recent Labs  Lab 06/21/18 0224 06/22/18 0327 06/23/18 0457 06/25/18 0350 06/26/18 0435  WBC  --  14.5*  --  25.0* 17.1*  CREATININE 0.63  --  0.78 0.86 0.66    Estimated Creatinine Clearance: 118.8 mL/min (by C-G formula based on SCr of 0.66 mg/dL).    Allergies  Allergen Reactions  . Other Other (See Comments)    Seasonal allergies- Stuffy nose, itchy eyes, congestion, etc..    Antimicrobials this admission: Cefepime 5/2 >> 5/4 Merrem 5/4 >> (5/12)  Thank you for allowing pharmacy to be a part of this patient's care.  Jeanella Cara, PharmD, [facility] Clinical Pharmacist Please see AMION for all Pharmacists' Contact Phone Numbers 06/27/2018, 10:02 AM

## 2018-06-28 ENCOUNTER — Encounter (HOSPITAL_COMMUNITY): Payer: Self-pay | Admitting: Physical Medicine and Rehabilitation

## 2018-06-28 DIAGNOSIS — T50902A Poisoning by unspecified drugs, medicaments and biological substances, intentional self-harm, initial encounter: Secondary | ICD-10-CM

## 2018-06-28 DIAGNOSIS — N179 Acute kidney failure, unspecified: Secondary | ICD-10-CM

## 2018-06-28 DIAGNOSIS — F1920 Other psychoactive substance dependence, uncomplicated: Secondary | ICD-10-CM

## 2018-06-28 DIAGNOSIS — D62 Acute posthemorrhagic anemia: Secondary | ICD-10-CM

## 2018-06-28 DIAGNOSIS — R0682 Tachypnea, not elsewhere classified: Secondary | ICD-10-CM

## 2018-06-28 LAB — GLUCOSE, CAPILLARY
Glucose-Capillary: 103 mg/dL — ABNORMAL HIGH (ref 70–99)
Glucose-Capillary: 110 mg/dL — ABNORMAL HIGH (ref 70–99)
Glucose-Capillary: 86 mg/dL (ref 70–99)
Glucose-Capillary: 96 mg/dL (ref 70–99)
Glucose-Capillary: 98 mg/dL (ref 70–99)

## 2018-06-28 NOTE — Progress Notes (Signed)
Report given to RN on 4NP. This RN notified mother and father of patient's transfer. Latish Toutant, [place] Scrape, RN

## 2018-06-28 NOTE — Progress Notes (Signed)
Inpatient Rehabilitation Admissions Coordinator  Noted referral made to [place] on 5/4 per parent preference. I will follow at a distance at this time to monitor his recovery and rehab venue needs as he progresses.  Ottie Glazier, RN, MSN Rehab Admissions Coordinator [phone] 06/28/2018 5:48 PM '

## 2018-06-28 NOTE — Progress Notes (Signed)
Spoke with pt's mother and girlfriend this am. They are each set up for an [facility] video call at [phone] and [phone]. Will update them if patient is transferred out of the ICU today or any other changes occur. Jong Rickman, [place] Scrape, RN

## 2018-06-28 NOTE — Progress Notes (Signed)
PROGRESS NOTE    Jesse [name]  CLE:[id number] DOB: [birth year] DOA: 06/13/2018 PCP: No primary care provider on file.    Brief Narrative:  28 year old male who was found down in his vehicle, unresponsive, suspected postictal.  He had several unmarked pills around him.  Patient did not respond to naloxone.  While at the emergency department he required intubation for airway protection.  Upon admission to the intensive care unit he was hypotensive, required vasopressors.  He had a prolonged hospital stay, he was diagnosed with staph aureus and Klebsiella pneumonia.  He continued to be encephalopathic, possible anoxic injury.  He required tracheostomy April 30, in order  to be liberated from mechanical ventilation.  Assessment & Plan:   Active Problems:   Altered mental status   Acute respiratory failure with hypoxia (HCC)   Status post tracheostomy ([place])   Drug overdose   Shock ([place])   Goals of care, counseling/discussion   Palliative care by specialist   AKI (acute kidney injury) ([place])   Tachypnea   Acute blood loss anemia   Drug addiction ([place])  1. Acute hypoxic and hypercapnic respiratory failure due to encephalopathy, possible hypoxic brain injury. Tolerating well trach collar, managing secretions well. Has tube for feedings per NG. Continue Keppra for seizure prophylaxis. Sedated on my exam today, but per nursing patient has been stable.   2. Pneumonia (not present on admission)/ Klebsiella ESBL Continue with meropenem for antibiotic therapy #2/8,  Continue oxymetry monitoring and aspiration precautions. Will dc steroids.   3. Urinary retention. Continue foley for now.    DVT prophylaxis: enoxaparin   Code Status:  full Family Communication: no family at the bedside  Disposition Plan/ discharge barriers: Patient family wants transfer to [facility], [facility]   Body mass index is 17.73 kg/m. Malnutrition Type:  Nutrition Problem: Inadequate oral intake  Etiology: inability to eat   Malnutrition Characteristics:  Signs/Symptoms: NPO status   Nutrition Interventions:  Interventions: Refer to RD note for recommendations  RN Pressure Injury Documentation:     Consultants:   CC  Procedures:     Antimicrobials:   Meropenem     Subjective: Patient is sedated today, not much interactive, per nursing he has been stable. Noted to be depressed.   Objective: Vitals:   06/28/18 1104 06/28/18 1200 06/28/18 1300 06/28/18 1400  BP: 128/68 112/68    Pulse: 95 (!) 113 88 71  Resp: (!) _0 Temp:  98.8 F (37.1 C)    TempSrc:  Axillary    SpO2: 96% 97% 97% 97%  Weight:      Height:        Intake/Output Summary (Last 24 hours) at 06/28/2018 1422 Last data filed at 06/28/2018 1400 Gross per 24 hour  Intake 1660.2 ml  Output 1600 ml  Net 60.2 ml   Filed Weights   06/24/18 0500 06/26/18 0500 06/28/18 0500  Weight: 67.8 kg 59.5 kg 59.3 kg    Examination:   General: sedated  Neurology: sedated, but known to have 4 extremity paresis.  E ENT: mild pallor, no icterus, oral mucosa moist/ trach in place, NG tube in place.  Cardiovascular: No JVD. S1-S2 present, rhythmic, no gallops, rubs, or murmurs. No lower extremity edema. Pulmonary: positrive breath sounds bilaterally, adequate air movement, no wheezing, rhonchi or rales. Gastrointestinal. Abdomen with, no organomegaly, non tender, no rebound or guarding Skin. No rashes Musculoskeletal: no joint deformities     Data Reviewed: I have personally reviewed following labs  and imaging studies  CBC: Recent Labs  Lab 06/22/18 0327 06/25/18 0350 06/26/18 0435  WBC 14.5* 25.0* 17.1*  NEUTROABS  --  20.8*  --   HGB 13.3 12.3* 12.1*  HCT 38.4* 36.9* 36.0*  MCV 88.9 92.0 92.3  PLT 384 460* 045*   Basic Metabolic Panel: Recent Labs  Lab 06/23/18 0457 06/25/18 0350 06/26/18 0435  NA 141 142 142  K 4.3 4.0 3.8  CL 103 103 107  CO2 _0 GLUCOSE 99 102*  110*  BUN 27* 29* 25*  CREATININE 0.78 0.86 0.66  CALCIUM 10.0 9.3 9.3  MG  --  2.1  --    GFR: Estimated Creatinine Clearance: 118.4 mL/min (by C-G formula based on SCr of 0.66 mg/dL). Liver Function Tests: Recent Labs  Lab 06/25/18 0350  AST 39  ALT 77*  ALKPHOS 70  BILITOT 1.2  PROT 6.9  ALBUMIN 3.1*   No results for input(s): LIPASE, AMYLASE in the last 168 hours. No results for input(s): AMMONIA in the last 168 hours. Coagulation Profile: Recent Labs  Lab 06/23/18 0541  INR 1.2   Cardiac Enzymes: Recent Labs  Lab 06/25/18 0350  CKTOTAL 106   BNP (last 3 results) No results for input(s): PROBNP in the last 8760 hours. HbA1C: No results for input(s): HGBA1C in the last 72 hours. CBG: Recent Labs  Lab 06/27/18 1935 06/27/18 2332 06/28/18 0350 06/28/18 0758 06/28/18 1147  GLUCAP 90 107* 103* 110* 86   Lipid Profile: No results for input(s): CHOL, HDL, LDLCALC, TRIG, CHOLHDL, LDLDIRECT in the last 72 hours. Thyroid Function Tests: No results for input(s): TSH, T4TOTAL, FREET4, T3FREE, THYROIDAB in the last 72 hours. Anemia Panel: No results for input(s): VITAMINB12, FOLATE, FERRITIN, TIBC, IRON, RETICCTPCT in the last 72 hours.    Radiology Studies: I have reviewed all of the imaging during this hospital visit personally     Scheduled Meds: . bethanechol  10 mg Per Tube TID  . chlorhexidine gluconate (MEDLINE KIT)  15 mL Mouth Rinse BID  . enoxaparin (LOVENOX) injection  40 mg Subcutaneous Q24H  . feeding supplement (PRO-STAT SUGAR FREE 64)  30 mL Per Tube Daily  . ipratropium-albuterol  3 mL Nebulization TID  . levETIRAcetam  500 mg Per Tube BID  . mouth rinse  15 mL Mouth Rinse 10 times per day  . propranolol  20 mg Per Tube Q2000   Continuous Infusions: . sodium chloride 250 mL (06/27/18 0557)  . feeding supplement (VITAL 1.5 CAL) 1,000 mL (06/27/18 2304)  . meropenem (MERREM) IV 2 g (06/28/18 0641)     LOS: 15 days         Mauricio Gerome Apley, MD  '

## 2018-06-28 NOTE — Progress Notes (Signed)
Assisted tele visit to patient with partner.  Binnie Rail, RN

## 2018-06-28 NOTE — Progress Notes (Signed)
1520 Patient admitted as transfer from Neuro ICU to room 4NPC10. All lines secure. Trach with trach collar at 28% intact, pt with strong productive cough. No distress evident. Vital signs trending stable as reported from Neuro ICU. Coretrak in place with Vital 1.5 at 65 cc hr running and intact. Condom cath present. Foot boots/Scd in place. Hand/wrist/forearm splints in place bilaterally. Lungs with bilateral breath sounds clear after suction ing or coughing and clearing airway. Trach supplies, #8 Shiley trach inner cannula and replacement present. Ambu-bag placed at bedside. Side rails up x3. HOB up 30 degrees. Neuro status unchanged from Neuro ICU report given by Melina Copa. Gabriel Cirri RN

## 2018-06-28 NOTE — Evaluation (Signed)
Speech Language Pathology Evaluation Patient Details Name: Jesse BaliMichael David [name] MRN: [id number] DOB: 1990-05-23 Today's Date: 06/28/2018 Time: [id number] SLP Time Calculation (min) (ACUTE ONLY): 16 min  Problem List:  Patient Active Problem List   Diagnosis Date Noted  . AKI (acute kidney injury) (HCC)   . Tachypnea   . Acute blood loss anemia   . Drug addiction (HCC)   . Drug overdose   . Shock (HCC)   . Goals of care, counseling/discussion   . Palliative care by specialist   . Status post tracheostomy (HCC)   . Acute respiratory failure with hypoxia (HCC)   . Altered mental status 06/13/2018   Past Medical History:  Past Medical History:  Diagnosis Date  . Anxiety   . Depression   . Fetal alcohol syndrome   . History of attempted suicide   . Polysubstance abuse (HCC)    cocaine, opiates   Past Surgical History: History reviewed. No pertinent surgical history. HPI:  28 year old male found down in car with suspected drug overdose versus status epilepticus. Intubated 4/20. 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 No significant medical past history but history of alcohol and drug abuse   Assessment / Plan / Recommendation Clinical Impression  Speech-cognitive assessment during co-treatment with PT/OT. Exhibits communicative-cognitive deficts in conjunction with anoxic brain injury and basal ganglia petechial hemorrhage. Pt transferred to chair and followed commands to move leg x 3; would not open oral cavity on request. He was awake with periods of lethargy and flat affect. No attempts at oral or gesture communication spontaneous or cued (Passy-Muir valve trials during evaluation). Poor head control and not able to respond  y/n with head gestures. OT reported prior communication system established with eye blink for yes (has not responded with no). ST will continue intervention for cognition-communication.     SLP Assessment  SLP Recommendation/Assessment: Patient needs continued Speech Lanaguage Pathology Services SLP Visit Diagnosis: Cognitive communication deficit (R41.841)    Follow Up Recommendations  Inpatient Rehab    Frequency and Duration min 2x/week  2 weeks      SLP Evaluation Cognition  Overall Cognitive Status: Impaired/Different from baseline Arousal/Alertness: Lethargic(awake mostly with periods of lethargy) Orientation Level: (no response to y/n questions) Attention: Sustained Sustained Attention: Impaired Sustained Attention Impairment: Functional basic Memory: (TBA) Awareness: Impaired Awareness Impairment: Emergent impairment;Anticipatory impairment Problem Solving: Impaired Problem Solving Impairment: Functional basic Behaviors: Other (comment)(flat affect) Safety/Judgment: Impaired       Comprehension  Auditory Comprehension Overall Auditory Comprehension: Other (comment)(comprehends basic commands intermittently) Yes/No Questions: (did not respond to y/n questions today) Commands: Impaired One Step Basic Commands: 25-49% accurate Interfering Components: Attention;Processing speed Visual Recognition/Discrimination Discrimination: Not tested Reading Comprehension Reading Status: (TBA)    Expression Expression Primary Mode of Expression: Other (comment)(no attempts to communicate or phonate) Verbal Expression Overall Verbal Expression: Other (comment)(no vocalization) Initiation: Impaired Naming: Not tested Pragmatics: Impairment Impairments: Abnormal affect;Eye contact Interfering Components: Attention Written Expression Dominant Hand: Left Written Expression: Not tested   Oral / Motor  Oral Motor/Sensory Function Overall Oral Motor/Sensory Function:  Generalized oral weakness Motor Speech Overall Motor Speech: Other (comment)(no response to oral-motor commands) Phonation: (n/a) Intelligibility: Unable to assess (comment)   GO  Royce Macadamia 06/28/2018, 12:05 PM  Breck Coons [name].Ed Nurse, children's [phone] Office [phone]

## 2018-06-28 NOTE — Progress Notes (Signed)
Physical Therapy Treatment Patient Details Name: Jesse [name] MRN: [id number] DOB: [birth year] Today's Date: 06/28/2018    History of Present Illness 28 year old male found down in car with suspected drug overdose versus status epilepticus. Intubated 4/20. MRI showing multiple areas of restricted diffusion, predominantly in the cerebellar hemispheres which might be representative of hypoxic/anoxic injury but specifically in bilateral globus pallidus and lentiform nuclei on the right. No significant medical past history but history of alcohol and drug abuse.     PT Comments    Patient seen for activity progression and OOB transfer attempt in conjunction with OT and SLP. Patient tolerated OOB to chair well. VSS and patient with improved engagement in LE activation during power up and transition to chair. Patient with evidence of improvements in spasticity and tone response as noted when reflexive cough engaged with minimal synergy activations throughout extremities. Continues to show isolation in RLE movements. Overall, appears with some increased fatigue this session compared to previous session. More effort to engage. Encouraged nursing to limit AM family interactions prior to therapy sessions to allow patient consistent period of rest prior to engaging in therapies. Continue to feel patient is progressing well and will benefit from comprehensive and intensive therapy regimen. CIR remains appropriate.   Follow Up Recommendations  CIR     Equipment Recommendations       Recommendations for Other Services       Precautions / Restrictions Precautions Precautions: Fall Precaution Comments: Pt trached     Mobility  Bed Mobility Overal bed mobility: Needs Assistance Bed Mobility: Supine to Sit     Supine to sit: Max assist;+2 for physical assistance     General bed mobility comments: Limited initiation of RLE to EOB, increased assist to elevate trunk to upright via helicopter  technique to EOB. Cued for cervical position verbally with delayed response to control.  Transfers Overall transfer level: Needs assistance Equipment used: (2 person face to face with chuck pad) Transfers: Sit to/from UGI CorporationStand;Stand Pivot Transfers Sit to Stand: Mod assist;+2 physical assistance;+2 safety/equipment Stand pivot transfers: Mod assist;+2 physical assistance;+2 safety/equipment       General transfer comment: Patient able to engage during initiation of power up to standing, patient with apparent shuffle of RLE when pivoting to chair.  Ambulation/Gait                 Stairs             Wheelchair Mobility    Modified Rankin (Stroke Patients Only) Modified Rankin (Stroke Patients Only) Pre-Morbid Rankin Score: No symptoms Modified Rankin: Severe disability     Balance Overall balance assessment: Needs assistance Sitting-balance support: Feet supported;Bilateral upper extremity supported Sitting balance-Leahy Scale: Poor Sitting balance - Comments: required assist at EOB but demonstrates active trunk and cervical control with cues. Patient able to engage in EOB LE activity in sitting with posterior support. At times with noted UE purposeful support on from RUE. Postural control: Posterior lean Standing balance support: During functional activity Standing balance-Leahy Scale: Poor Standing balance comment: 2 person assist for brief standing during transition to chair                            Cognition Arousal/Alertness: Awake/alert Behavior During Therapy: Flat affect Overall Cognitive Status: Impaired/Different from baseline Area of Impairment: Attention;Following commands;Awareness;Problem solving                   Current Attention Level:  Sustained   Following Commands: Follows one step commands inconsistently;Follows one step commands with increased time     Problem Solving: Slow processing;Requires verbal cues;Requires  tactile cues General Comments: patient more limited this session compared to previous session, question pre-session activity engagement.       Exercises      General Comments General comments (skin integrity, edema, etc.): VSS throughout session      Pertinent Vitals/Pain Pain Assessment: Faces Faces Pain Scale: Hurts little more Pain Descriptors / Indicators: Discomfort Pain Intervention(s): Monitored during session    Home Living       Type of Home: House              Prior Function            PT Goals (current goals can now be found in the care plan section) Acute Rehab PT Goals Patient Stated Goal: unstated Progress towards PT goals: Progressing toward goals    Frequency    Min 4X/week      PT Plan Current plan remains appropriate    Co-evaluation PT/OT/SLP Co-Evaluation/Treatment: Yes Reason for Co-Treatment: Complexity of the patient's impairments (multi-system involvement) PT goals addressed during session: Mobility/safety with mobility OT goals addressed during session: Strengthening/ROM      AM-PAC PT "6 Clicks" Mobility   Outcome Measure  Help needed turning from your back to your side while in a flat bed without using bedrails?: A Lot Help needed moving from lying on your back to sitting on the side of a flat bed without using bedrails?: A Lot Help needed moving to and from a bed to a chair (including a wheelchair)?: A Lot Help needed standing up from a chair using your arms (e.g., wheelchair or bedside chair)?: A Lot Help needed to walk in hospital room?: Total Help needed climbing 3-5 steps with a railing? : Total 6 Click Score: 10    End of Session Equipment Utilized During Treatment: (ATC) Activity Tolerance: Patient tolerated treatment well Patient left: in chair;with call bell/phone within reach;with chair alarm set Nurse Communication: Mobility status PT Visit Diagnosis: Other symptoms and signs involving the nervous system  (R29.898)     Time: 1030-1101 PT Time Calculation (min) (ACUTE ONLY): 31 min  Charges:  $Neuromuscular Re-education: 8-22 mins                     Charlotte Crumb, PT DPT  Board Certified Neurologic Specialist Acute Rehabilitation Services Pager [phone] Office [phone]    Fabio Asa 06/28/2018, 2:58 PM

## 2018-06-28 NOTE — Progress Notes (Signed)
Nutrition Follow-up   RD working remotely.  DOCUMENTATION CODES:   Not applicable  INTERVENTION:  Continue Vital 1.5 formula via Cortrak NGT at goal rate of 65 ml/hr.  Continue 30 ml Prostat once daily.   Tube feeding regimen provides 2440 kcal, 120 grams of protein, and 1186 ml free water.   NUTRITION DIAGNOSIS:   Inadequate oral intake related to inability to eat as evidenced by NPO status; ongoing  GOAL:   Patient will meet greater than or equal to 90% of their needs; met via TF  MONITOR:   Labs, Weight trends, I & O's, Skin, TF tolerance  REASON FOR ASSESSMENT:   Consult, Ventilator Enteral/tube feeding initiation and management  ASSESSMENT:   Pt with PMH of substance abuse and prior suicide attempt admitted 4/20 with suspected drug overdose (UDS positive on admission for amphetamines, cocaine, and benzos), suspected RLL aspiration PNA, and AKI after being found down in his car outside of a hotel.   Tracheostomy placed 4/30. Pt extubated to ATC 5/3 and respiratory status has been stable. Cortrak NGT placed 5/1. Pt remains NPO. Pt with cognitive deficits with anoxic brain injury. Per SLP, pt with poor head control with periods of lethargy. RD to continue with current tube feeding regimen via Cortrak. Labs and medications reviewed.   Diet Order:   Diet Order    None      EDUCATION NEEDS:   No education needs have been identified at this time  Skin:  Skin Assessment: Reviewed RN Assessment  Last BM:  5/5  Height:   Ht Readings from Last 1 Encounters:  06/13/18 6' (1.829 m)    Weight:   Wt Readings from Last 1 Encounters:  06/28/18 59.3 kg    Ideal Body Weight:  80.9 kg  BMI:  Body mass index is 17.73 kg/m.  Estimated Nutritional Needs:   Kcal:  2400-2600  Protein:  110-120 grams  Fluid:  > 2 L/day    Corrin Parker, MS, RD, LDN Pager # [phone] After hours/ weekend pager # [phone]

## 2018-06-28 NOTE — Progress Notes (Signed)
Patient tolerated OOB to chair very well with stable VS and no evidence of distress for period over 1.5 hours. Feel patient likely would have tolerated additional time OOB in chair but patient with bowel incontinence requiring hygiene and pericare warranting fatigue and transfer back to bed. Will continue to see and progress as tolerated.    06/28/18 1500  PT Visit Information  Last PT Received On 06/28/18  Assistance Needed +2  History of Present Illness 28 year old male found down in car with suspected drug overdose versus status epilepticus. Intubated 4/20. MRI showing multiple areas of restricted diffusion, predominantly in the cerebellar hemispheres which might be representative of hypoxic/anoxic injury but specifically in bilateral globus pallidus and lentiform nuclei on the right. No significant medical past history but history of alcohol and drug abuse.   Subjective Data  Subjective patient seen for assist with hygiene and return to bed  Patient Stated Goal unstated  Precautions  Precautions Fall  Precaution Comments Pt trached   Pain Assessment  Pain Assessment Faces  Faces Pain Scale 4  Pain Descriptors / Indicators Discomfort  Bed Mobility  Overal bed mobility Needs Assistance  Bed Mobility Rolling;Sit to Supine  Rolling Total assist  Sit to supine Max assist;+2 for physical assistance  Transfers  Overall transfer level Needs assistance  Equipment used  (2 person face to face with chuck pad)  Transfers Sit to/from Stand;Stand Pivot Transfers  Sit to Stand Mod assist;+2 physical assistance;+2 safety/equipment  Stand pivot transfers Mod assist;+2 physical assistance;+2 safety/equipment  General transfer comment tolerated transfer from lower surface chair back to bed with moderate assist. continues to show strong engagement during power up to standing  General Comments  General comments (skin integrity, edema, etc.) VSS throughout  PT - End of Session  Equipment Utilized  During Treatment  (ATC)  Activity Tolerance Patient tolerated treatment well  Patient left in bed;with call bell/phone within reach;with nursing/sitter in room  Nurse Communication Mobility status   PT - Assessment/Plan  PT Plan Current plan remains appropriate  PT Visit Diagnosis Other symptoms and signs involving the nervous system (R29.898)  PT Frequency (ACUTE ONLY) Min 4X/week  Follow Up Recommendations CIR  PT equipment  (TBD)  AM-PAC PT "6 Clicks" Mobility Outcome Measure (Version 2)  Help needed turning from your back to your side while in a flat bed without using bedrails? 2  Help needed moving from lying on your back to sitting on the side of a flat bed without using bedrails? 2  Help needed moving to and from a bed to a chair (including a wheelchair)? 2  Help needed standing up from a chair using your arms (e.g., wheelchair or bedside chair)? 2  Help needed to walk in hospital room? 1  Help needed climbing 3-5 steps with a railing?  1  6 Click Score 10  Consider Recommendation of Discharge To: CIR/SNF/LTACH  PT Goal Progression  Progress towards PT goals Progressing toward goals  PT Time Calculation  PT Start Time (ACUTE ONLY) 1220  PT Stop Time (ACUTE ONLY) 1241  PT Time Calculation (min) (ACUTE ONLY) 21 min  PT General Charges  $$ ACUTE PT VISIT 1 Visit  PT Treatments  $Therapeutic Activity 8-22 mins   Charlotte Crumb, PT DPT  Board Certified Neurologic Specialist Acute Rehabilitation Services Pager [phone] Office [phone]

## 2018-06-28 NOTE — Evaluation (Signed)
Passy-Muir Speaking Valve - Evaluation Patient Details  Name: Jesse [name] MRN: [id number] Date of Birth: 03-Jun-1990  Today's Date: 06/28/2018 Time: 1045-1101 SLP Time Calculation (min) (ACUTE ONLY): 16 min  Past Medical History:  Past Medical History:  Diagnosis Date  . Anxiety   . Depression   . Fetal alcohol syndrome   . History of attempted suicide   . Polysubstance abuse (HCC)    cocaine, opiates   Past Surgical History: History reviewed. No pertinent surgical history. HPI:  28 year old male found down in car with suspected drug overdose versus status epilepticus. Intubated 4/20. MRI showing multiple areas of restricted diffusion, predominantly in the cerebellar hemispheres which might be representative of hypoxic/anoxic injury but specifically in bilateral globus pallidus and lentiform nuclei on the right. No significant medical past history but history of alcohol and drug abuse   Assessment / Plan / Recommendation Clinical Impression  Pt seen in conjunction with PT/OT for Passy-Muir speaking valve assessment with #8 inflated cuff trach. Tolerated slow cuff deflation with strong cough and productive expectoration of mucous via trach and questionable oral cavity. Valve remained in place for up to 12 second intervals then pt would cough pushing valve from hub consistently but unable to completely clear secretions. RR reached 40 but typically stayed in mid 20's, HR greatest at 130 around 115, SpO2 92-96%. No spontaneous oral/labial movement or attempts to phonate on request. Intermittently followed commands for PT to move leg; did not open mouth on command. Apparently less alert and interactive today. RT outside room and requested deep suctioning as pt continued to intermittently cough. Good prognosis for valve and use with SLP only presently.      SLP Visit Diagnosis: Aphonia (R49.1)    SLP Assessment  Patient needs continued Speech Lanaguage Pathology Services    Follow Up  Recommendations  Inpatient Rehab    Frequency and Duration min 2x/week  2 weeks    PMSV Trial PMSV was placed for: intervals with greatest up to 15 sec Able to redirect subglottic air through upper airway: Yes Able to Attain Phonation: No attempt to phonate Able to Expectorate Secretions: Yes Level of Secretion Expectoration with PMSV: Tracheal;Oral Intelligibility: Unable to assess (comment) Respirations During Trial: (18-40 after cuff deflation ) SpO2 During Trial: (92-96%) Pulse During Trial: (highest 130 for several seconds) Behavior: Lethargic;Controlled;Cooperative   Tracheostomy Tube       Vent Dependency  FiO2 (%): 28 %    Cuff Deflation Trial  GO Tolerated Cuff Deflation: Yes(frequent coughing ) Length of Time for Cuff Deflation Trial: 30 min Behavior: Cooperative;Controlled(more lethargic than yesterday per OT)        Royce Macadamia 06/28/2018, 11:32 AM   Breck Coons [name].Ed Nurse, children's [phone] Office [phone]

## 2018-06-28 NOTE — Progress Notes (Signed)
Rehab Admissions Coordinator Note:  Patient was screened by Clois Dupes for appropriateness for an Inpatient Acute Rehab Consult per PT and OT recs.   At this time, we are recommending Inpatient Rehab consult.  Clois Dupes RN MSN 06/28/2018, 8:21 AM  I can be reached at [phone].

## 2018-06-28 NOTE — Consult Note (Signed)
Physical Medicine and Rehabilitation Consult   Reason for Consult: Anoxic BI/overdose Referring Physician: Dr. Ella JubileeArrien   HPI: Jesse [name] is a 28 y.o. male with history of depression, anxiety, ADHD, substance abuse and prior suicidal attempt who admitted on 06/13/2018 after being found unresponsive.  There was a question of seizure activity.  History taken from chart review and therapists.  He displayed "bizzare choreiform movements" with noxious stimulation. UDS positive for amphetamine, benzodiazapine's, and cocaine. MRI brain revealed multifocal diffusion abnormality within deep gray nuclei, bilateral supratentorial white matter and both cerebellar hemisphere's consistent with ABI. MRA brain negative for occlusion or stenosis. 2D echo with ejection fraction of 35-60%. He was intubated for airway protection, loaded with keppra due to concerns of seizure and started on levophed. Dr. Wilford CornerArora felt that patient with toxic metabolic encephalopathy in setting of hypoxic and anoxic brain injury with concerns of carbon monoxide poisoning. EEG negative for seizure activity. He continued to have posturing and repeat EEG showed slowing but no seizures.    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  Supportive care was recommended with monitoring for neurological recovery.  Hospital course significant for SIRS due to aspiration PNA, intermittent decerebrate posturing with minimal responsiveness,  AKI due to ATN, urinary retention and tracheostomy performed 4/30 in hopes of weaning off vent.  He developed fevers 5/1 with leucocytosis due to Stap/Klebsiella HCAP and defervesced with addition of meropenum 5/4.  He was extubated to ATC on 5/3 and respiratory status stable. Therapy ongoing and patient is showing improvement in mentation with ability to follow simple motor commands  by blinking and showing increase in interaction at times.  Per therapies, patient was better day yesterday being able to follow commands.  CIR recommended due to ABI. [facility]     Review of Systems  Unable to perform ROS: Mental acuity     Past Medical History:  Diagnosis Date  . Depression   . Fetal alcohol syndrome   . History of attempted suicide   . Polysubstance abuse (HCC)    cocaine, opiates     History reviewed. No pertinent surgical history.    Family History  Adopted: Yes     Social History: Per fiancee- Lives with her. Independent--was playing hockey and works in Advertising copywritermeat department at [employer]. She reports that he chews tobacco. Denies any illicit drug use. She reports that he last used alcohol 2/20.     Allergies  Allergen Reactions  . Other Other (See Comments)    Seasonal allergies- Stuffy nose, itchy eyes, congestion, etc..    Medications Prior to Admission  Medication Sig Dispense Refill  . acetaminophen (TYLENOL) 325 MG tablet Take 325-650 mg by mouth every 6 (six) hours as needed for mild pain or headache.    Marland Kitchen. aspirin EC 325 MG tablet Take 325 mg by mouth as needed (for headaches or pain).    . cetirizine (ZYRTEC) 10 MG tablet Take 10 mg by mouth daily.    . citalopram (CELEXA) 10 MG tablet Take 30 mg by mouth daily.    . diazepam (VALIUM) 2 MG tablet Take 2 mg by mouth at bedtime as needed (for sleep or anxiety).    . fluticasone (FLONASE) 50 MCG/ACT nasal spray Place 1 spray into both nostrils 2 (two) times daily as needed for allergies or rhinitis.    .Marland Kitchen  gabapentin (NEURONTIN) 300 MG capsule Take 300-600 mg by mouth every evening.     Marland Kitchen ibuprofen (ADVIL) 200 MG tablet Take 200-400 mg by mouth every 6 (six) hours as needed for headache or mild pain.    . Ibuprofen-diphenhydrAMINE Cit (ADVIL PM) 200-38 MG TABS Take 1-2 tablets by mouth at bedtime as needed (FOR SLEEP).    Marland Kitchen lisdexamfetamine (VYVANSE) 30 MG capsule Take 30  mg by mouth daily.    Marland Kitchen LORazepam (ATIVAN) 0.5 MG tablet Take 0.5 mg by mouth daily as needed for anxiety or sleep.    . Melatonin 10 MG TABS Take 30-40 mg by mouth at bedtime.    . naproxen sodium (ALEVE) 220 MG tablet Take 220-440 mg by mouth 2 (two) times daily as needed (for headaches or pain).      Home: Home Living Family/patient expects to be discharged to:: Private residence Living Arrangements: Spouse/significant other Available Help at Discharge: Family Type of Home: House Home Access: Stairs to enter Secretary/administrator of Steps: 2-3 Home Layout: One level Bathroom Shower/Tub: Engineer, manufacturing systems: Standard Home Equipment: None Additional Comments: Called mom to collect home information  Functional History: Prior Function Level of Independence: Independent Comments: RN reporting that pt was independent, 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  Functional Status:  Mobility: Bed Mobility Overal bed mobility: Needs Assistance Bed Mobility: Supine to Sit, Sit to Supine, Rolling Rolling: Total assist, +2 for physical assistance Supine to sit: Max assist, +2 for physical assistance Sit to supine: Max assist, +2 for physical assistance General bed mobility comments: Patient cued to bring RLE off EOB and was able to do this appropriately upon command, patient also with some noted trunk engagement and purposeful movement to EOB, max assist to elevate trunk to upright and rotrate hips to EOB. Upon return to supine, patient aable to elevate RLE to bed, assist to control descent and reposition Transfers Overall transfer level: Needs assistance Equipment used: (vital go lift bed.  ) Transfer via Lift Equipment: (vital go lift bed.  ) Transfers: Sit to/from Stand Sit to Stand: Mod assist, Max assist, +2 physical assistance, +2 safety/equipment General transfer comment: Patient able to initiate power up through right side with assist, LLE  blocked at the knee for stability, multi modal cues for elevation of trunk, chest and head. Facilitation and VCs effective with increased time. Patient able to static stand with progression from Max to moderate assist with good activation of proximal stabilizing musculature.  Ambulation/Gait General Gait Details: unable at this time    ADL: ADL Overall ADL's : Needs assistance/impaired General ADL Comments: Requires total A  Cognition: Cognition Overall Cognitive Status: Impaired/Different from baseline Orientation Level: Intubated/Tracheostomy - Unable to assess Cognition Arousal/Alertness: Awake/alert Behavior During Therapy: Flat affect Overall Cognitive Status: Impaired/Different from baseline Area of Impairment: Attention, Following commands, Awareness, Problem solving Current Attention Level: Sustained Following Commands: Follows one step commands consistently, Follows one step commands with increased time Awareness: Intellectual Problem Solving: Slow processing, Requires verbal cues, Requires tactile cues General Comments: Patient very engaged in session and following simple commands consistently in varying positions (supine, EOB, and Standing). Patient also able to establish consistent response for expression of yes/no via eye blink.  Difficult to assess due to: Tracheostomy   Blood pressure 128/68, pulse 82, temperature 99.4 F (37.4 C), resp. rate 19, height 6' (1.829 m), weight 59.3 kg, SpO2 98 %. Physical Exam  Nursing note and vitals reviewed. Constitutional: He  appears well-developed and well-nourished.  HENT:  Head: Normocephalic and atraumatic.  Eyes: Right eye exhibits no discharge. Left eye exhibits no discharge.  Neck:  Cuffed trach in place  Respiratory: Effort normal. No respiratory distress.  GI: He exhibits no distension.  Musculoskeletal:     Comments: No edema or tenderness in extremities  Neurological: He is alert.  Did not make eye contact and no  attempts at response. Flaccid extremities  Does not follow commands  Skin: Skin is warm and dry.  Psychiatric:  Unable to assess due to mentation    Results for orders placed or performed during the hospital encounter of 06/13/18 (from the past 24 hour(s))  Glucose, capillary     Status: Abnormal   Collection Time: 06/27/18 12:07 PM  Result Value Ref Range   Glucose-Capillary 106 (H) 70 - 99 mg/dL  Glucose, capillary     Status: Abnormal   Collection Time: 06/27/18  3:09 PM  Result Value Ref Range   Glucose-Capillary 109 (H) 70 - 99 mg/dL  Glucose, capillary     Status: None   Collection Time: 06/27/18  7:35 PM  Result Value Ref Range   Glucose-Capillary 90 70 - 99 mg/dL  Glucose, capillary     Status: Abnormal   Collection Time: 06/27/18 11:32 PM  Result Value Ref Range   Glucose-Capillary 107 (H) 70 - 99 mg/dL  Glucose, capillary     Status: Abnormal   Collection Time: 06/28/18  3:50 AM  Result Value Ref Range   Glucose-Capillary 103 (H) 70 - 99 mg/dL  Glucose, capillary     Status: Abnormal   Collection Time: 06/28/18  7:58 AM  Result Value Ref Range   Glucose-Capillary 110 (H) 70 - 99 mg/dL   No results found.  Assessment/Plan: Diagnosis: Anoxic BI Labs and images (see above) independently reviewed.  Records reviewed and summated above.  1. Does the need for close, 24 hr/day medical supervision in concert with the patient's rehab needs make it unreasonable for this patient to be served in a less intensive setting? Yes  2. 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 Due to bladder management, bowel management, safety, skin/wound care, disease management, medication administration, pain management and patient education, does the patient require 24 hr/day rehab nursing? Yes 4. Does the patient require coordinated care of a physician, rehab nurse, PT (1-2 hrs/day, 5 days/week), OT (1-2 hrs/day, 5 days/week) and SLP (1-2 hrs/day, 5 days/week) to address physical and functional deficits in the context of the above medical diagnosis(es)? Yes Addressing deficits in the following areas: balance, endurance, locomotion, strength, transferring, bowel/bladder control, bathing, dressing, feeding, grooming, toileting, cognition, speech, language, swallowing and psychosocial support 5. Can the patient actively participate in an intensive therapy program of at least 3 hrs of therapy per day at least 5 days per week? Potentially 6. The potential for patient to make measurable gains while on inpatient rehab is excellent and good 7. Anticipated functional outcomes upon discharge from inpatient rehab are mod assist and max assist  with PT, mod assist and  max assist with OT, max assist with SLP. 8. Estimated rehab length of stay to reach the above functional goals is: 24-28 days. 9. Anticipated D/C setting: TBD 10. Anticipated post D/C treatments: HH therapy and Home excercise program 11. Overall Rehab/Functional Prognosis: fair  RECOMMENDATIONS: This patient's condition is appropriate for continued rehabilitative care in the following setting: CIR to decrease burden of care. Patient has agreed to participate in recommended program. Potentially Note that insurance prior authorization may be required for reimbursement for recommended care.  Comment: Rehab Admissions Coordinator to follow up.   I have  personally performed a face to face diagnostic evaluation, including, but not limited to relevant history and physical exam findings, of this patient and developed relevant assessment and plan.  Additionally, I have reviewed and concur with the physician assistant's documentation above.   Maryla Morrow, MD, ABPMR Jacquelynn Cree, PA-C 06/28/2018

## 2018-06-28 NOTE — Progress Notes (Signed)
Occupational Therapy Progress Note  Pt seen in conjunction with PT.  He is making excellent progress.  He is alert, and fully interactive within the confines of movement available to him.  He followed one step commands consistently, or tried if movement was limited.  He is limited by profound weakness and spasticity.   He was able to sit EOB with max A  To periods of mod A, and stood with max A +2 progressing briefly to mod A +2.   He is beginning to initiate movement bil. UEs more proximally than distally.  He closes his eyes for "yes" response.  Discharge recommendations updated to CIR.    06/27/18 2000  OT Visit Information  Last OT Received On 06/28/18  Assistance Needed +2  PT/OT/SLP Co-Evaluation/Treatment Yes  Reason for Co-Treatment Complexity of the patient's impairments (multi-system involvement);Necessary to address cognition/behavior during functional activity;For patient/therapist safety;To address functional/ADL transfers  OT goals addressed during session Strengthening/ROM  History of Present Illness 28 year old male found down in car with suspected drug overdose versus status epilepticus. Intubated 4/20. MRI showing multiple areas of restricted diffusion, predominantly in the cerebellar hemispheres which might be representative of hypoxic/anoxic injury but specifically in bilateral globus pallidus and lentiform nuclei on the right. No significant medical past history but history of alcohol and drug abuse.   Precautions  Precautions Fall  Precaution Comments Pt trached   Pain Assessment  Faces Pain Scale 0  Cognition  Arousal/Alertness Awake/alert  Behavior During Therapy Flat affect  Overall Cognitive Status Impaired/Different from baseline  Area of Impairment Attention;Following commands;Awareness;Problem solving  Current Attention Level Sustained  Following Commands Follows one step commands consistently;Follows one step commands with increased time  Problem Solving Slow  processing;Requires verbal cues;Requires tactile cues  General Comments Patient very engaged in session and following simple commands consistently in varying positions (supine, EOB, and Standing). Patient also able to establish consistent response for expression of yes/no via eye blink.   Difficult to assess due to Tracheostomy  Upper Extremity Assessment  Upper Extremity Assessment RUE deficits/detail;LUE deficits/detail  RUE Deficits / Details Pt demonstrates flexor spasticity, but is beginning to be able to initiate movement   RUE Coordination decreased fine motor;decreased gross motor  LUE Deficits / Details Pt with extensor spasticity.  Able to initiate proximal movement   LUE Coordination decreased fine motor;decreased gross motor  Lower Extremity Assessment  Lower Extremity Assessment Defer to PT evaluation  ADL  General ADL Comments Requires total A  Bed Mobility  Overal bed mobility Needs Assistance  Bed Mobility Supine to Sit;Sit to Supine;Rolling  Supine to sit Max assist;+2 for physical assistance  Sit to supine Max assist;+2 for physical assistance  General bed mobility comments Patient cued to bring RLE off EOB and was able to do this appropriately upon command, patient also with some noted trunk engagement and purposeful movement to EOB, max assist to elevate trunk to upright and rotrate hips to EOB. Upon return to supine, patient aable to elevate RLE to bed, assist to control descent and reposition  Balance  Overall balance assessment Needs assistance  Sitting-balance support Feet supported;Bilateral upper extremity supported  Sitting balance-Leahy Scale Poor  Sitting balance - Comments required assist at EOB but demonstrates active trunk and cervical control with cues. Patient able to engage in EOB LE activity in sitting with posterior support. At times with noted UE purposeful support on from RUE.  Postural control Posterior lean  Standing balance support During functional  activity  Standing balance-Leahy  Scale Poor  Standing balance comment 2 person physical assist, progressed from max assist to moderate assist with improvements in postural alignment and head control with verbal cues.  Restrictions  Weight Bearing Restrictions No  Transfers  Overall transfer level Needs assistance  Transfers Sit to/from Stand  Sit to Stand Mod assist;Max assist;+2 physical assistance;+2 safety/equipment  General transfer comment Patient able to initiate power up through right side with assist, LLE blocked at the knee for stability, multi modal cues for elevation of trunk, chest and head. Facilitation and VCs effective with increased time. Patient able to static stand with progression from Max to moderate assist with good activation of proximal stabilizing musculature.   Exercises  Exercises General Lower Extremity  General Exercises - Upper Extremity  Shoulder Flexion PROM;Right;Left;5 reps;Supine  Composite Extension PROM;Both;10 reps;Supine  Other Exercises  Other Exercises Long arc quads at EOB, R LE AROM full range against gravity 3+, trace activation with PROM LLE x5  Other Exercises Trunk rotation stretch at EOB  Other Exercises Cervical ROM activity at EOB  OT - End of Session  Activity Tolerance Patient tolerated treatment well  Patient left in bed;with call bell/phone within reach  Nurse Communication Mobility status  OT Assessment/Plan  OT Plan Discharge plan needs to be updated  OT Visit Diagnosis Muscle weakness (generalized) (M62.81);Other symptoms and signs involving cognitive function  OT Frequency (ACUTE ONLY) Min 3X/week  Recommendations for Other Services Rehab consult  Follow Up Recommendations CIR;Supervision/Assistance - 24 hour  OT Equipment None recommended by OT  AM-PAC OT "6 Clicks" Daily Activity Outcome Measure (Version 2)  Help from another person eating meals? 1  Help from another person taking care of personal grooming? 1  Help from  another person toileting, which includes using toliet, bedpan, or urinal? 1  Help from another person bathing (including washing, rinsing, drying)? 1  Help from another person to put on and taking off regular upper body clothing? 1  Help from another person to put on and taking off regular lower body clothing? 1  6 Click Score 6  OT Goal Progression  Progress towards OT goals Progressing toward goals  Acute Rehab OT Goals  Patient Stated Goal Unstated  OT Time Calculation  OT Start Time (ACUTE ONLY) 1508  OT Stop Time (ACUTE ONLY) 1548  OT Time Calculation (min) 40 min  OT General Charges  $OT Visit 1 Visit  OT Treatments  $Neuromuscular Re-education 8-22 mins  Jeani HawkingWendi Maryanna Stuber, OTR/L Acute Rehabilitation Services Pager [phone] Office [phone]

## 2018-06-28 NOTE — Progress Notes (Signed)
I spoke with patient's mother and give her an update.

## 2018-06-28 NOTE — Progress Notes (Signed)
Assisted tele visit to patient with father.  Binnie Rail, RN

## 2018-06-28 NOTE — Progress Notes (Signed)
Assisted tele visit to patient with mother.  Adriana Quinby Ferrer, RN  

## 2018-06-29 DIAGNOSIS — Z515 Encounter for palliative care: Secondary | ICD-10-CM

## 2018-06-29 DIAGNOSIS — G931 Anoxic brain damage, not elsewhere classified: Secondary | ICD-10-CM

## 2018-06-29 LAB — GLUCOSE, CAPILLARY
Glucose-Capillary: 103 mg/dL — ABNORMAL HIGH (ref 70–99)
Glucose-Capillary: 105 mg/dL — ABNORMAL HIGH (ref 70–99)
Glucose-Capillary: 105 mg/dL — ABNORMAL HIGH (ref 70–99)
Glucose-Capillary: 105 mg/dL — ABNORMAL HIGH (ref 70–99)
Glucose-Capillary: 121 mg/dL — ABNORMAL HIGH (ref 70–99)
Glucose-Capillary: 81 mg/dL (ref 70–99)

## 2018-06-29 NOTE — Progress Notes (Signed)
PROGRESS NOTE    Jesse [name]  TGG:[id number] DOB: March 28, 1990 DOA: 06/13/2018 PCP: No primary care provider on file.    Brief Narrative:  28 year old male who was found down in his vehicle, unresponsive, suspected postictal. He had several unmarked pills around him. Patient did not respond to naloxone. While at the emergency department he required intubation for airway protection. Upon admission to the intensive care unit he was hypotensive, required vasopressors.  He had a prolonged hospital stay, he was diagnosed with staph aureus and Klebsiella pneumonia. He continued to be encephalopathic, possible anoxic injury. He required tracheostomy April 30, in orderto be liberated from mechanical ventilation.  Assessment & Plan:   Active Problems:   Altered mental status   Acute respiratory failure with hypoxia (HCC)   Status post tracheostomy ([place])   Drug overdose   Shock ([place])   Goals of care, counseling/discussion   Palliative care by specialist   AKI (acute kidney injury) ([place])   Tachypnea   Acute blood loss anemia   Drug addiction ([place])   1. Acute hypoxic and hypercapnic respiratory failure due to encephalopathy, possible hypoxic brain injury.Tolerating well trach collar, managing secretions well. Has tube for feedings per NG. Continue Keppra for seizure prophylaxis. Remains non-verbal on my exam today. Family trying to arrange transfer to [facility] facility. Case Management following  2. Pneumonia (not present on admission)/ Klebsiella ESBL Continued with meropenem for antibiotic therapy #3/8,  Continue oxymetry monitoring and aspiration precautions. Now off steroids.   3. Urinary retention. Continue foley for now.   DVT prophylaxis: Lovenox subQ Code Status: Full Family Communication: Pt in room, family not at bedside Disposition Plan: Uncertain at this time  Consultants:   Critical Care  Procedures:     Antimicrobials: Anti-infectives (From  admission, onward)   Start     Dose/Rate Route Frequency Ordered Stop   06/27/18 1000  meropenem (MERREM) 2 g in sodium chloride 0.9 % 100 mL IVPB     2 g 200 mL/hr over 30 Minutes Intravenous Every 8 hours 06/27/18 0950 07/05/18 1359   06/25/18 0900  ceFEPIme (MAXIPIME) 2 g in sodium chloride 0.9 % 100 mL IVPB  Status:  Discontinued     2 g 200 mL/hr over 30 Minutes Intravenous Every 8 hours 06/25/18 0856 06/27/18 0936   06/14/18 1100  Ampicillin-Sulbactam (UNASYN) 3 g in sodium chloride 0.9 % 100 mL IVPB     3 g 200 mL/hr over 30 Minutes Intravenous Every 6 hours 06/14/18 1015 06/20/18 1858       Subjective: Nonverbal   Objective: Vitals:   06/29/18 0855 06/29/18 1127 06/29/18 1508 06/29/18 1510  BP:      Pulse: (!) 102 94  81  Resp: (!) 24 16  16   Temp: (!) 100.8 F (38.2 C)     TempSrc: Axillary     SpO2: 97% 96% 92% 96%  Weight:      Height:        Intake/Output Summary (Last 24 hours) at 06/29/2018 1604 Last data filed at 06/29/2018 0352 Gross per 24 hour  Intake 198.31 ml  Output 1050 ml  Net -851.69 ml   Filed Weights   06/26/18 0500 06/28/18 0500 06/29/18 0600  Weight: 59.5 kg 59.3 kg 80.7 kg    Examination:  General exam: Appears calm and comfortable  Respiratory system: Clear to auscultation. Respiratory effort normal. Cardiovascular system: S1 & S2 heard, RRR. Gastrointestinal system: Abdomen is nondistended, soft and nontender. No organomegaly or masses felt. Normal  bowel sounds heard. Central nervous system: Asleep No focal neurological deficits. Extremities: Symmetric 5 x 5 power. Skin: No rashes, lesions Psychiatry: Unable to discern as pt is nonverbal  Data Reviewed: I have personally reviewed following labs and imaging studies  CBC: Recent Labs  Lab 06/25/18 0350 06/26/18 0435  WBC 25.0* 17.1*  NEUTROABS 20.8*  --   HGB 12.3* 12.1*  HCT 36.9* 36.0*  MCV 92.0 92.3  PLT 460* 287*   Basic Metabolic Panel: Recent Labs  Lab 06/23/18  0457 06/25/18 0350 06/26/18 0435  NA 141 142 142  K 4.3 4.0 3.8  CL 103 103 107  CO2 27 28 24   GLUCOSE 99 102* 110*  BUN 27* 29* 25*  CREATININE 0.78 0.86 0.66  CALCIUM 10.0 9.3 9.3  MG  --  2.1  --    GFR: Estimated Creatinine Clearance: 154.9 mL/min (by C-G formula based on SCr of 0.66 mg/dL). Liver Function Tests: Recent Labs  Lab 06/25/18 0350  AST 39  ALT 77*  ALKPHOS 70  BILITOT 1.2  PROT 6.9  ALBUMIN 3.1*   No results for input(s): LIPASE, AMYLASE in the last 168 hours. No results for input(s): AMMONIA in the last 168 hours. Coagulation Profile: Recent Labs  Lab 06/23/18 0541  INR 1.2   Cardiac Enzymes: Recent Labs  Lab 06/25/18 0350  CKTOTAL 106   BNP (last 3 results) No results for input(s): PROBNP in the last 8760 hours. HbA1C: No results for input(s): HGBA1C in the last 72 hours. CBG: Recent Labs  Lab 06/28/18 2057 06/28/18 2356 06/29/18 0411 06/29/18 0845 06/29/18 1310  GLUCAP 98 81 105* 103* 105*   Lipid Profile: No results for input(s): CHOL, HDL, LDLCALC, TRIG, CHOLHDL, LDLDIRECT in the last 72 hours. Thyroid Function Tests: No results for input(s): TSH, T4TOTAL, FREET4, T3FREE, THYROIDAB in the last 72 hours. Anemia Panel: No results for input(s): VITAMINB12, FOLATE, FERRITIN, TIBC, IRON, RETICCTPCT in the last 72 hours. Sepsis Labs: Recent Labs  Lab 06/25/18 0350  PROCALCITON 0.12    Recent Results (from the past 240 hour(s))  Culture, respiratory (non-expectorated)     Status: None   Collection Time: 06/23/18  3:32 PM  Result Value Ref Range Status   Specimen Description TRACHEAL ASPIRATE  Final   Special Requests NONE  Final   Gram Stain   Final    ABUNDANT WBC PRESENT,BOTH PMN AND MONONUCLEAR MODERATE GRAM POSITIVE COCCI Performed at [facility], [address], [place], [place] [address]    Culture   Final    FEW STAPHYLOCOCCUS AUREUS FEW KLEBSIELLA PNEUMONIAE Confirmed Extended Spectrum Beta-Lactamase  Producer (ESBL).  In bloodstream infections from ESBL organisms, carbapenems are preferred over piperacillin/tazobactam. They are shown to have a lower risk of mortality.    Report Status 06/26/2018 FINAL  Final   Organism ID, Bacteria STAPHYLOCOCCUS AUREUS  Final   Organism ID, Bacteria KLEBSIELLA PNEUMONIAE  Final      Susceptibility   Klebsiella pneumoniae - MIC*    AMPICILLIN >=32 RESISTANT Resistant     CEFAZOLIN >=64 RESISTANT Resistant     CEFEPIME 2 SENSITIVE Sensitive     CEFTAZIDIME 16 INTERMEDIATE Intermediate     CEFTRIAXONE >=64 RESISTANT Resistant     CIPROFLOXACIN 1 SENSITIVE Sensitive     GENTAMICIN <=1 SENSITIVE Sensitive     IMIPENEM <=0.25 SENSITIVE Sensitive     TRIMETH/SULFA >=320 RESISTANT Resistant     AMPICILLIN/SULBACTAM >=32 RESISTANT Resistant     PIP/TAZO 16 SENSITIVE Sensitive  Extended ESBL POSITIVE Resistant     * FEW KLEBSIELLA PNEUMONIAE   Staphylococcus aureus - MIC*    CIPROFLOXACIN <=0.5 SENSITIVE Sensitive     ERYTHROMYCIN >=8 RESISTANT Resistant     GENTAMICIN <=0.5 SENSITIVE Sensitive     OXACILLIN <=0.25 SENSITIVE Sensitive     TETRACYCLINE <=1 SENSITIVE Sensitive     VANCOMYCIN 1 SENSITIVE Sensitive     TRIMETH/SULFA <=10 SENSITIVE Sensitive     CLINDAMYCIN RESISTANT Resistant     RIFAMPIN <=0.5 SENSITIVE Sensitive     Inducible Clindamycin POSITIVE Resistant     * FEW STAPHYLOCOCCUS AUREUS     Radiology Studies: No results found.  Scheduled Meds: . bethanechol  10 mg Per Tube TID  . chlorhexidine gluconate (MEDLINE KIT)  15 mL Mouth Rinse BID  . enoxaparin (LOVENOX) injection  40 mg Subcutaneous Q24H  . feeding supplement (PRO-STAT SUGAR FREE 64)  30 mL Per Tube Daily  . ipratropium-albuterol  3 mL Nebulization TID  . levETIRAcetam  500 mg Per Tube BID  . mouth rinse  15 mL Mouth Rinse 10 times per day  . propranolol  20 mg Per Tube Q2000   Continuous Infusions: . sodium chloride 250 mL (06/27/18 0557)  . feeding supplement  (VITAL 1.5 CAL) 1,000 mL (06/27/18 2304)  . meropenem (MERREM) IV 2 g (06/29/18 0701)     LOS: 16 days   Marylu Lund, MD [facility] Pager On Amion  If 7PM-7AM, please contact night-coverage 06/29/2018, 4:04 PM

## 2018-06-29 NOTE — Care Management (Signed)
Provided clinical and therapy updates to [facility], admissions coordinator at [facility], per her request.  Evaluation currently in progress, but no decision for admission yet. Should pt be accepted into this rehab facility, we will still need to get insurance authorization and ensure bed availability.    Updated pt's father, as he had called me earlier to inquire on status of rehab evaluation.  Quintella Baton, RN, BSN  Trauma/Neuro ICU Case Manager [phone]

## 2018-06-29 NOTE — Progress Notes (Signed)
Occupational Therapy Progress Note Late entry   Pt seen with PT for OOB transfer to chair - he was able to complete with mod A +2.  Pt awake, but not following commands as briskly today - appears fatigued.  He is requiring less assist today for functional mobility.  Bed exchanged for regular ICU bed as he no longer requires tilt bed.  Continue to recommend inpatient rehab.  Will continue to follow.     06/28/18 1501  OT Visit Information  Last OT Received On 06/29/18  Assistance Needed +2  PT/OT/SLP Co-Evaluation/Treatment Yes  Reason for Co-Treatment Complexity of the patient's impairments (multi-system involvement);For patient/therapist safety;Necessary to address cognition/behavior during functional activity  OT goals addressed during session Strengthening/ROM  History of Present Illness 28 year old male found down in car with suspected drug overdose versus status epilepticus. Intubated 4/20. MRI showing multiple areas of restricted diffusion, predominantly in the cerebellar hemispheres which might be representative of hypoxic/anoxic injury but specifically in bilateral globus pallidus and lentiform nuclei on the right. No significant medical past history but history of alcohol and drug abuse.   Precautions  Precautions Fall  Precaution Comments Pt trached   Pain Assessment  Faces Pain Scale 4  Pain Descriptors / Indicators Discomfort  Cognition  Arousal/Alertness Awake/alert  Behavior During Therapy Flat affect  Overall Cognitive Status Impaired/Different from baseline  Area of Impairment Attention;Following commands;Awareness;Problem solving  Current Attention Level Sustained;Focused  Following Commands Follows one step commands inconsistently;Follows one step commands with increased time  Problem Solving Slow processing;Requires verbal cues;Requires tactile cues  General Comments patient more limited this session compared to previous session, question pre-session activity  engagement.   Difficult to assess due to Tracheostomy  Upper Extremity Assessment  RUE Deficits / Details Pt with less spasticity noted today   LUE Deficits / Details Pt with less spasticity noted today   Bed Mobility  Overal bed mobility Needs Assistance  Bed Mobility Supine to Sit  Supine to sit Max assist;+2 for physical assistance  General bed mobility comments Limited initiation of RLE to EOB, increased assist to elevate trunk to upright via helicopter technique to EOB. Cued for cervical position verbally with delayed response to control.  Balance  Overall balance assessment Needs assistance  Sitting-balance support Feet supported;Bilateral upper extremity supported  Sitting balance-Leahy Scale Poor  Sitting balance - Comments required assist at EOB but demonstrates active trunk and cervical control with cues. Patient able to engage in EOB LE activity in sitting with posterior support. At times with noted UE purposeful support on from RUE.  Postural control Posterior lean  Standing balance support During functional activity  Standing balance-Leahy Scale Poor  Standing balance comment 2 person assist for brief standing during transition to chair  Transfers  Overall transfer level Needs assistance  Equipment used  (2 person face to face with chuck pad)  Transfers Sit to/from Stand;Stand Pivot Transfers  Sit to Stand Mod assist;+2 physical assistance;+2 safety/equipment  Stand pivot transfers Mod assist;+2 physical assistance;+2 safety/equipment  General transfer comment Patient able to engage during initiation of power up to standing, patient with apparent shuffle of RLE when pivoting to chair.  General Comments  General comments (skin integrity, edema, etc.) VSS  OT - End of Session  Equipment Utilized During Treatment Oxygen  Activity Tolerance Patient tolerated treatment well  Patient left in chair;with call bell/phone within reach;with nursing/sitter in room;with chair alarm  set  Nurse Communication Mobility status  OT Assessment/Plan  OT Plan Discharge plan needs  to be updated  OT Visit Diagnosis Muscle weakness (generalized) (M62.81);Other symptoms and signs involving cognitive function  OT Frequency (ACUTE ONLY) Min 3X/week  Recommendations for Other Services Rehab consult  Follow Up Recommendations CIR;Supervision/Assistance - 24 hour  OT Equipment None recommended by OT  AM-PAC OT "6 Clicks" Daily Activity Outcome Measure (Version 2)  Help from another person eating meals? 1  Help from another person taking care of personal grooming? 1  Help from another person toileting, which includes using toliet, bedpan, or urinal? 1  Help from another person bathing (including washing, rinsing, drying)? 1  Help from another person to put on and taking off regular upper body clothing? 1  Help from another person to put on and taking off regular lower body clothing? 1  6 Click Score 6  OT Goal Progression  Progress towards OT goals Progressing toward goals  Acute Rehab OT Goals  Patient Stated Goal unstated     06/28/18 1500  OT Time Calculation  OT Start Time (ACUTE ONLY) 1030  OT Stop Time (ACUTE ONLY) 1101  OT Time Calculation (min) 31 min  OT General Charges  $OT Visit 1 Visit  OT Treatments  $Neuromuscular Re-education 8-22 mins  Jeani HawkingWendi Jenin Birdsall, OTR/L Acute Rehabilitation Services Pager [phone] Office [phone]

## 2018-06-29 NOTE — Progress Notes (Signed)
Patient seen per nursing request for patient mobility and assist back to bed in order to transition patient to new room. Upon entering room patient extremely alert watching hockey on television with appropriate tracking and attentive visually to therapist upon verbal interaction. Patient much more interactive with right side this afternoon, able to engage RUE to push to standing and transfer back to bed. Once returned to supine, assisted with donning hand splints and prevalon boots. Current POC remains appropriate, continue to feel patient would benefit from increased therapeutic interactions to progress functional recovery.      06/29/18 1600  PT Visit Information  Last PT Received On 06/29/18  Assistance Needed +2  History of Present Illness 28 year old male found down in car with suspected drug overdose versus status epilepticus. Intubated 4/20. MRI showing multiple areas of restricted diffusion, predominantly in the cerebellar hemispheres which might be representative of hypoxic/anoxic injury but specifically in bilateral globus pallidus and lentiform nuclei on the right. No significant medical past history but history of alcohol and drug abuse.   Subjective Data  Subjective patient seen for assist back to bed tolerated ~3 hours OOB  Patient Stated Goal unstated  Precautions  Precautions Fall  Precaution Comments Pt trached   Pain Assessment  Pain Assessment Faces  Faces Pain Scale 4  Pain Descriptors / Indicators Discomfort  Pain Intervention(s) Monitored during session  Cognition  Arousal/Alertness Awake/alert  Behavior During Therapy Flat affect  Overall Cognitive Status Impaired/Different from baseline  Area of Impairment Attention;Following commands;Awareness;Problem solving  Current Attention Level Focused  Following Commands Follows one step commands inconsistently;Follows one step commands with increased time  Problem Solving Slow processing;Requires verbal cues;Requires tactile  cues  General Comments limited interaction today, continues to demonstrate increased fatigue with cognitive and functional task performance  Difficult to assess due to Tracheostomy  Bed Mobility  Overal bed mobility Needs Assistance  Bed Mobility Rolling;Sit to Supine  Rolling Total assist  Sit to supine Max assist;+2 for physical assistance  Transfers  Overall transfer level Needs assistance  Equipment used  (2 person face to face with chuck pad)  Transfers Sit to/from Stand;Stand Pivot Transfers  Sit to Stand Mod assist;+2 physical assistance;+2 safety/equipment  Stand pivot transfers Mod assist;+2 physical assistance;+2 safety/equipment  General transfer comment Improved initiation of right push up to standing, moderate assist to transfer back to bed going to the right.   Balance  Overall balance assessment Needs assistance  Sitting-balance support Feet supported;Bilateral upper extremity supported  Sitting balance-Leahy Scale Poor  Sitting balance - Comments required assist at EOB but demonstrates active trunk and cervical control with cues. Patient able to engage in EOB LE activity in sitting with posterior support. At times with noted UE purposeful support on from RUE.  Postural control Posterior lean  Standing balance support During functional activity  Standing balance-Leahy Scale Poor  PT - End of Session  Equipment Utilized During Treatment  (ATC)  Activity Tolerance Patient tolerated treatment well  Patient left in bed;with call bell/phone within reach;with nursing/sitter in room  Nurse Communication Mobility status   PT - Assessment/Plan  PT Plan Current plan remains appropriate  PT Visit Diagnosis Other symptoms and signs involving the nervous system (R29.898)  PT Frequency (ACUTE ONLY) Min 4X/week  Follow Up Recommendations CIR  PT equipment  (TBD)  AM-PAC PT "6 Clicks" Mobility Outcome Measure (Version 2)  Help needed turning from your back to your side while in a  flat bed without using bedrails? 2  Help  needed moving from lying on your back to sitting on the side of a flat bed without using bedrails? 2  Help needed moving to and from a bed to a chair (including a wheelchair)? 2  Help needed standing up from a chair using your arms (e.g., wheelchair or bedside chair)? 2  Help needed to walk in hospital room? 1  Help needed climbing 3-5 steps with a railing?  1  6 Click Score 10  Consider Recommendation of Discharge To: CIR/SNF/LTACH  PT Goal Progression  Progress towards PT goals Progressing toward goals  PT Time Calculation  PT Start Time (ACUTE ONLY) 1445  PT Stop Time (ACUTE ONLY) 1502  PT Time Calculation (min) (ACUTE ONLY) 17 min  PT General Charges  $$ ACUTE PT VISIT 1 Visit  PT Treatments  $Therapeutic Activity 8-22 mins    Charlotte Crumb, PT DPT  Board Certified Neurologic Specialist Acute Rehabilitation Services Pager [phone] Office [phone]

## 2018-06-29 NOTE — Progress Notes (Signed)
  Speech Language Pathology Treatment: Cognitive-Linquistic;Passy Muir Speaking valve  Patient Details Name: Jesse [name] MRN: [id number] DOB: November 07, 1990 Today's Date: 06/29/2018 Time: 9509-3267 SLP Time Calculation (min) (ACUTE ONLY): 27 min  Assessment / Plan / Recommendation Clinical Impression  Jesse [name]'s alertness waxed and waned during ST/PT co-treat. Max verbal/tactile support needed throughout session for attention with periods of direct eye contact, distractibility and falling asleep. Passy-Muir valve tolerated longer today up to one minute. Wheezing/stridor, increased work of breathing and reflexive cough popping valve off trach hub. Other times therapist doffed PMV with notable back pressure. No volitional vocalization or mouthing of words although minimal vocalization during throat clears. He protruded tongue just to teeth and slightly lateralized on command however no response when SLP modeled labial placement for /m/ or imitate "Jesse [name]". Closed eyes on command as this has been his "yes" response to questions (one deliberate blink); have not been successful with "no" (2 blinks). He demonstrates poor head control and cannot elevate thumb (to communicate) due to spasticity (per PT).  ST will continue treatment for communication system, PMV and cognition.     HPI HPI: 28 year old male found down in car with suspected drug overdose versus status epilepticus. Intubated 4/20. 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 No significant medical past history but history of alcohol and drug abuse      SLP Plan  Continue with current plan of care       Recommendations         Patient may use Passy-Muir Speech Valve: with SLP only PMSV  Supervision: Full MD: Please consider changing trach tube to : Smaller size;Cuffless         General recommendations: Rehab consult Oral Care Recommendations: Oral care QID Follow up Recommendations: Inpatient Rehab SLP Visit Diagnosis: Aphonia (R49.1);Cognitive communication deficit (T24.580) Plan: Continue with current plan of care       GO                Jesse [name] 06/29/2018, 11:32 AM  Jesse [name].Ed Nurse, children's [phone] Office [phone]

## 2018-06-29 NOTE — Progress Notes (Signed)
Reviewed chart.  Sat at bedside with [name] (Father on Face Time) for about 15 min.  Father was able to see Jesse [name] and tell him he loved him.  He shared with Jesse [name] the overwhelming support that is being generated for him.  Jesse [name] slept thru the call but his father was very Adult nurse.  [name], PA-C Palliative Medicine Pager: [phone]   15 min.

## 2018-06-29 NOTE — Progress Notes (Signed)
Physical Therapy Treatment Patient Details Name: Jesse [name] MRN: [id number] DOB: Jan 01, 1991 Today's Date: 06/29/2018    History of Present Illness 28 year old male found down in car with suspected drug overdose versus status epilepticus. Intubated 4/20. MRI showing multiple areas of restricted diffusion, predominantly in the cerebellar hemispheres which might be representative of hypoxic/anoxic injury but specifically in bilateral globus pallidus and lentiform nuclei on the right. No significant medical past history but history of alcohol and drug abuse.     PT Comments    Patient seen for OOB mobility and ROM activities. Patient also positioned in conjunction with SLP for PMSV trials. Patient tolerated therapy activity well but with noted increase in fatigue limited engagement today. PROM to BLE and OOB to chair for session. Will continue to see and progress as tolerated. Continue to feel patient needs comprehensive therapies and recommend the sooner the intensity of the sessions can be increased the better the potential for motor and functional progression.    Follow Up Recommendations  CIR     Equipment Recommendations       Recommendations for Other Services       Precautions / Restrictions Precautions Precautions: Fall Precaution Comments: Pt trached     Mobility  Bed Mobility Overal bed mobility: Needs Assistance Bed Mobility: Supine to Sit     Supine to sit: Max assist;+2 for physical assistance     General bed mobility comments: Assist to elevate trunk and rotate to EOB  Transfers Overall transfer level: Needs assistance Equipment used: (2 person face to face with chuck pad) Transfers: Sit to/from [employer] Sit to Stand: Mod assist;+2 physical assistance;+2 safety/equipment Stand pivot transfers: +2 physical assistance;+2 safety/equipment;Max assist       General transfer comment: Max assist today for pivot transfer to chair, limited  engagement from patient this session, question increased fatigue.  Ambulation/Gait                 Stairs             Wheelchair Mobility    Modified Rankin (Stroke Patients Only) Modified Rankin (Stroke Patients Only) Pre-Morbid Rankin Score: No symptoms Modified Rankin: Severe disability     Balance Overall balance assessment: Needs assistance Sitting-balance support: Feet supported;Bilateral upper extremity supported Sitting balance-Leahy Scale: Poor Sitting balance - Comments: required assist at EOB but demonstrates active trunk and cervical control with cues. Patient able to engage in EOB LE activity in sitting with posterior support. At times with noted UE purposeful support on from RUE. Postural control: Posterior lean Standing balance support: During functional activity Standing balance-Leahy Scale: Poor Standing balance comment: 2 person assist for brief standing during transition to chair                            Cognition Arousal/Alertness: Awake/alert Behavior During Therapy: Flat affect Overall Cognitive Status: Impaired/Different from baseline Area of Impairment: Attention;Following commands;Awareness;Problem solving                   Current Attention Level: Focused   Following Commands: Follows one step commands inconsistently;Follows one step commands with increased time     Problem Solving: Slow processing;Requires verbal cues;Requires tactile cues General Comments: limited interaction today, continues to demonstrate increased fatigue with cognitive and functional task performance      Exercises General Exercises - Lower Extremity Ankle Circles/Pumps: PROM;Left;Right;5 reps;Supine Heel Slides: PROM;Right;Left;5 reps;Supine Hip Flexion/Marching: PROM;Right;Left;5 reps;Supine  General Comments General comments (skin integrity, edema, etc.): VSS      Pertinent Vitals/Pain Pain Assessment: Faces Faces Pain Scale:  Hurts little more Pain Descriptors / Indicators: Discomfort Pain Intervention(s): Monitored during session    Home Living                      Prior Function            PT Goals (current goals can now be found in the care plan section) Acute Rehab PT Goals Patient Stated Goal: unstated Progress towards PT goals: Progressing toward goals(modest progression, but limited engagement today)    Frequency    Min 4X/week      PT Plan Current plan remains appropriate    Co-evaluation PT/OT/SLP Co-Evaluation/Treatment: Yes Reason for Co-Treatment: Complexity of the patient's impairments (multi-system involvement) PT goals addressed during session: Mobility/safety with mobility        AM-PAC PT "6 Clicks" Mobility   Outcome Measure  Help needed turning from your back to your side while in a flat bed without using bedrails?: A Lot Help needed moving from lying on your back to sitting on the side of a flat bed without using bedrails?: A Lot Help needed moving to and from a bed to a chair (including a wheelchair)?: A Lot Help needed standing up from a chair using your arms (e.g., wheelchair or bedside chair)?: A Lot Help needed to walk in hospital room?: Total Help needed climbing 3-5 steps with a railing? : Total 6 Click Score: 10    End of Session Equipment Utilized During Treatment: (ATC) Activity Tolerance: Patient tolerated treatment well Patient left: in chair;with call bell/phone within reach;with chair alarm set Nurse Communication: Mobility status PT Visit Diagnosis: Other symptoms and signs involving the nervous system (W10.272(R29.898)     Time: [id number] PT Time Calculation (min) (ACUTE ONLY): 29 min  Charges:  $Therapeutic Activity: 8-22 mins                     Charlotte Crumbevon Nelton Amsden, PT DPT  Board Certified Neurologic Specialist Acute Rehabilitation Services Pager [phone] Office [phone]    Fabio AsaDevon J Gwenette Wellons 06/29/2018, 1:53 PM

## 2018-06-30 LAB — GLUCOSE, CAPILLARY
Glucose-Capillary: 100 mg/dL — ABNORMAL HIGH (ref 70–99)
Glucose-Capillary: 107 mg/dL — ABNORMAL HIGH (ref 70–99)
Glucose-Capillary: 111 mg/dL — ABNORMAL HIGH (ref 70–99)
Glucose-Capillary: 113 mg/dL — ABNORMAL HIGH (ref 70–99)
Glucose-Capillary: 95 mg/dL (ref 70–99)
Glucose-Capillary: 97 mg/dL (ref 70–99)
Glucose-Capillary: 98 mg/dL (ref 70–99)

## 2018-06-30 NOTE — Progress Notes (Addendum)
PROGRESS NOTE    Jesse [name]  GUR:[id number] DOB: 08-18-1990 DOA: 06/13/2018 PCP: No primary care provider on file.    Brief Narrative:  28 year old male who was found down in his vehicle, unresponsive, suspected postictal. He had several unmarked pills around him. Patient did not respond to naloxone. While at the emergency department he required intubation for airway protection. Upon admission to the intensive care unit he was hypotensive, required vasopressors.  He had a prolonged hospital stay, he was diagnosed with staph aureus and Klebsiella pneumonia. He continued to be encephalopathic, possible anoxic injury. He required tracheostomy April 30, in orderto be liberated from mechanical ventilation.  Assessment & Plan:   Active Problems:   Altered mental status   Acute respiratory failure with hypoxia (HCC)   Status post tracheostomy ([place])   Drug overdose   Shock ([place])   Goals of care, counseling/discussion   Palliative care by specialist   AKI (acute kidney injury) ([place])   Tachypnea   Acute blood loss anemia   Drug addiction ([place])   Anoxic brain injury ([place])   Palliative care encounter   1. Acute hypoxic and hypercapnic respiratory failure due to encephalopathy, possible hypoxic brain injury.Tolerating well trach collar, managing secretions well. Has tube for feedings per NG. Continue Keppra for seizure prophylaxis. Remains non-verbal on my exam today. Family working on arranging a transfer to [place] facility. Case management following  2. Pneumonia (not present on admission)/ Klebsiella ESBL Continued with meropenem for antibiotic therapy #3/8,  Continue oxymetry monitoring and aspiration precautions. Now off steroids. On minimal O2 support  3. Urinary retention. Continue foley for now.   DVT prophylaxis: Lovenox subQ Code Status: Full Family Communication: Pt in room, family not at bedside, updated patient's mother over phone Disposition Plan:  Uncertain at this time  Consultants:   Critical Care  Procedures:     Antimicrobials: Anti-infectives (From admission, onward)   Start     Dose/Rate Route Frequency Ordered Stop   06/27/18 1000  meropenem (MERREM) 2 g in sodium chloride 0.9 % 100 mL IVPB     2 g 200 mL/hr over 30 Minutes Intravenous Every 8 hours 06/27/18 0950 07/05/18 1759   06/25/18 0900  ceFEPIme (MAXIPIME) 2 g in sodium chloride 0.9 % 100 mL IVPB  Status:  Discontinued     2 g 200 mL/hr over 30 Minutes Intravenous Every 8 hours 06/25/18 0856 06/27/18 0936   06/14/18 1100  Ampicillin-Sulbactam (UNASYN) 3 g in sodium chloride 0.9 % 100 mL IVPB     3 g 200 mL/hr over 30 Minutes Intravenous Every 6 hours 06/14/18 1015 06/20/18 1858      Subjective: Remains nonverbal however seems to close eyes on command  Objective: Vitals:   06/30/18 0800 06/30/18 0915 06/30/18 1156 06/30/18 1200  BP: 118/67  118/67 131/74  Pulse: 79  95 (!) 102  Resp: 16  (!) 22 (!) 22  Temp: 98.5 F (36.9 C)   99.2 F (37.3 C)  TempSrc: Axillary   Axillary  SpO2: 96% 96% 95% 94%  Weight:      Height:        Intake/Output Summary (Last 24 hours) at 06/30/2018 1218 Last data filed at 06/30/2018 1031 Gross per 24 hour  Intake 950 ml  Output 1350 ml  Net -400 ml   Filed Weights   06/28/18 0500 06/29/18 0600 06/30/18 0447  Weight: 59.3 kg 80.7 kg 73.6 kg    Examination: General exam: Awake, laying in bed, in nad  Respiratory system: Normal respiratory effort, no wheezing Cardiovascular system: regular rate, s1, s2 Gastrointestinal system: Soft, nondistended, positive BS Central nervous system: CN2-12 grossly intact, strength intact Extremities: Perfused, no clubbing Skin: Normal skin turgor, no notable skin lesions seen Psychiatry: Unable to assess as pt is nonverbal  Data Reviewed: I have personally reviewed following labs and imaging studies  CBC: Recent Labs  Lab 06/25/18 0350 06/26/18 0435  WBC 25.0* 17.1*   NEUTROABS 20.8*  --   HGB 12.3* 12.1*  HCT 36.9* 36.0*  MCV 92.0 92.3  PLT 460* 563*   Basic Metabolic Panel: Recent Labs  Lab 06/25/18 0350 06/26/18 0435  NA 142 142  K 4.0 3.8  CL 103 107  CO2 28 24  GLUCOSE 102* 110*  BUN 29* 25*  CREATININE 0.86 0.66  CALCIUM 9.3 9.3  MG 2.1  --    GFR: Estimated Creatinine Clearance: 146.9 mL/min (by C-G formula based on SCr of 0.66 mg/dL). Liver Function Tests: Recent Labs  Lab 06/25/18 0350  AST 39  ALT 77*  ALKPHOS 70  BILITOT 1.2  PROT 6.9  ALBUMIN 3.1*   No results for input(s): LIPASE, AMYLASE in the last 168 hours. No results for input(s): AMMONIA in the last 168 hours. Coagulation Profile: No results for input(s): INR, PROTIME in the last 168 hours. Cardiac Enzymes: Recent Labs  Lab 06/25/18 0350  CKTOTAL 106   BNP (last 3 results) No results for input(s): PROBNP in the last 8760 hours. HbA1C: No results for input(s): HGBA1C in the last 72 hours. CBG: Recent Labs  Lab 06/29/18 1714 06/29/18 2005 06/30/18 0000 06/30/18 0446 06/30/18 0825  GLUCAP 105* 121* 95 100* 113*   Lipid Profile: No results for input(s): CHOL, HDL, LDLCALC, TRIG, CHOLHDL, LDLDIRECT in the last 72 hours. Thyroid Function Tests: No results for input(s): TSH, T4TOTAL, FREET4, T3FREE, THYROIDAB in the last 72 hours. Anemia Panel: No results for input(s): VITAMINB12, FOLATE, FERRITIN, TIBC, IRON, RETICCTPCT in the last 72 hours. Sepsis Labs: Recent Labs  Lab 06/25/18 0350  PROCALCITON 0.12    Recent Results (from the past 240 hour(s))  Culture, respiratory (non-expectorated)     Status: None   Collection Time: 06/23/18  3:32 PM  Result Value Ref Range Status   Specimen Description TRACHEAL ASPIRATE  Final   Special Requests NONE  Final   Gram Stain   Final    ABUNDANT WBC PRESENT,BOTH PMN AND MONONUCLEAR MODERATE GRAM POSITIVE COCCI Performed at [facility], [address] [place], [place] [address]    Culture    Final    FEW STAPHYLOCOCCUS AUREUS FEW KLEBSIELLA PNEUMONIAE Confirmed Extended Spectrum Beta-Lactamase Producer (ESBL).  In bloodstream infections from ESBL organisms, carbapenems are preferred over piperacillin/tazobactam. They are shown to have a lower risk of mortality.    Report Status 06/26/2018 FINAL  Final   Organism ID, Bacteria STAPHYLOCOCCUS AUREUS  Final   Organism ID, Bacteria KLEBSIELLA PNEUMONIAE  Final      Susceptibility   Klebsiella pneumoniae - MIC*    AMPICILLIN >=32 RESISTANT Resistant     CEFAZOLIN >=64 RESISTANT Resistant     CEFEPIME 2 SENSITIVE Sensitive     CEFTAZIDIME 16 INTERMEDIATE Intermediate     CEFTRIAXONE >=64 RESISTANT Resistant     CIPROFLOXACIN 1 SENSITIVE Sensitive     GENTAMICIN <=1 SENSITIVE Sensitive     IMIPENEM <=0.25 SENSITIVE Sensitive     TRIMETH/SULFA >=320 RESISTANT Resistant     AMPICILLIN/SULBACTAM >=32 RESISTANT Resistant  PIP/TAZO 16 SENSITIVE Sensitive     Extended ESBL POSITIVE Resistant     * FEW KLEBSIELLA PNEUMONIAE   Staphylococcus aureus - MIC*    CIPROFLOXACIN <=0.5 SENSITIVE Sensitive     ERYTHROMYCIN >=8 RESISTANT Resistant     GENTAMICIN <=0.5 SENSITIVE Sensitive     OXACILLIN <=0.25 SENSITIVE Sensitive     TETRACYCLINE <=1 SENSITIVE Sensitive     VANCOMYCIN 1 SENSITIVE Sensitive     TRIMETH/SULFA <=10 SENSITIVE Sensitive     CLINDAMYCIN RESISTANT Resistant     RIFAMPIN <=0.5 SENSITIVE Sensitive     Inducible Clindamycin POSITIVE Resistant     * FEW STAPHYLOCOCCUS AUREUS     Radiology Studies: No results found.  Scheduled Meds: . bethanechol  10 mg Per Tube TID  . chlorhexidine gluconate (MEDLINE KIT)  15 mL Mouth Rinse BID  . enoxaparin (LOVENOX) injection  40 mg Subcutaneous Q24H  . feeding supplement (PRO-STAT SUGAR FREE 64)  30 mL Per Tube Daily  . ipratropium-albuterol  3 mL Nebulization TID  . levETIRAcetam  500 mg Per Tube BID  . mouth rinse  15 mL Mouth Rinse 10 times per day  . propranolol  20  mg Per Tube Q2000   Continuous Infusions: . sodium chloride 250 mL (06/27/18 0557)  . feeding supplement (VITAL 1.5 CAL) 1,000 mL (06/30/18 1000)  . meropenem (MERREM) IV 2 g (06/30/18 1054)     LOS: 17 days   Marylu Lund, MD [facility] Pager On Amion  If 7PM-7AM, please contact night-coverage 06/30/2018, 12:18 PM

## 2018-06-30 NOTE — Plan of Care (Signed)
  Problem: Clinical Measurements: Goal: Ability to maintain clinical measurements within normal limits will improve Outcome: Progressing Goal: Will remain free from infection Outcome: Progressing   Problem: Elimination: Goal: Will not experience complications related to bowel motility Outcome: Progressing Goal: Will not experience complications related to urinary retention Outcome: Progressing   

## 2018-06-30 NOTE — Progress Notes (Signed)
Patient is moving his right hand and foot slowly. The left side still not moving at this time. Will continue to monitor patient.

## 2018-06-30 NOTE — Progress Notes (Signed)
Physical Therapy Treatment Patient Details Name: Jesse [name] MRN: [id number] DOB: [birth year] Today's Date: 06/30/2018    History of Present Illness 28 year old male found down in car with suspected drug overdose versus status epilepticus. Intubated 4/20. MRI showing multiple areas of restricted diffusion, predominantly in the cerebellar hemispheres which might be representative of hypoxic/anoxic injury but specifically in bilateral globus pallidus and lentiform nuclei on the right. No significant medical past history but history of alcohol and drug abuse.     PT Comments    Pt not following commands or following gaze when prompted.  He did squeeze therapist hand x1 at end of session.  Pt not engaging in activity seated edge of bed.  Unable to transfer to standing today based on presentation.  Continue to recommend CIR therapies but may revisit recommendations if he does not improve from today's session.    Follow Up Recommendations  CIR     Equipment Recommendations  (TBD)    Recommendations for Other Services Rehab consult     Precautions / Restrictions Precautions Precautions: Fall Precaution Comments: trach Restrictions Weight Bearing Restrictions: No    Mobility  Bed Mobility Overal bed mobility: Needs Assistance Bed Mobility: Rolling;Sidelying to Sit;Sit to Sidelying Rolling: Total assist Sidelying to sit: Total assist;+2 for physical assistance     Sit to sidelying: Total assist;+2 for physical assistance General bed mobility comments: Total assistance for all aspects of mobility no intiation from patient to participate.  Sat edge of bed x 10 min with posterior lean.  Innability to hold head upright and not following commands  Transfers Overall transfer level: (NT patient unable to intiate trunk into sitting so standing deferred not safe at this time.  )                  Ambulation/Gait                 Stairs             Wheelchair  Mobility    Modified Rankin (Stroke Patients Only)       Balance Overall balance assessment: Needs assistance   Sitting balance-Leahy Scale: Zero Sitting balance - Comments: Pt not following cues for trunk or cervical control.  posterior total assist for support sitting edge of bed.       Standing balance comment: unable                            Cognition Arousal/Alertness: Lethargic Behavior During Therapy: Flat affect Overall Cognitive Status: Difficult to assess                                 General Comments: Pt only able to follow command to squeeze hand x1 on R side.  He appears to be plateauing in cognition.       Exercises      General Comments        Pertinent Vitals/Pain Pain Assessment: Faces Faces Pain Scale: Hurts little more Pain Location: generalized discomfort with mobility Pain Descriptors / Indicators: Discomfort Pain Intervention(s): Monitored during session;Repositioned    Home Living                      Prior Function            PT Goals (current goals can now be found in the care plan section) Acute  Rehab PT Goals Patient Stated Goal: unstated Progress towards PT goals: Not progressing toward goals - comment    Frequency    Min 4X/week      PT Plan Current plan remains appropriate    Co-evaluation              AM-PAC PT "6 Clicks" Mobility   Outcome Measure  Help needed turning from your back to your side while in a flat bed without using bedrails?: Total Help needed moving from lying on your back to sitting on the side of a flat bed without using bedrails?: Total Help needed moving to and from a bed to a chair (including a wheelchair)?: Total Help needed standing up from a chair using your arms (e.g., wheelchair or bedside chair)?: Total Help needed to walk in hospital room?: Total Help needed climbing 3-5 steps with a railing? : Total 6 Click Score: 6    End of Session    Activity Tolerance: Patient limited by lethargy Patient left: in bed;with call bell/phone within reach;with bed alarm set(placed in chair position to better manage secretions) Nurse Communication: Mobility status;Need for lift equipment(need for suction) PT Visit Diagnosis: Other symptoms and signs involving the nervous system (O53.664)     Time: 4034-7425 PT Time Calculation (min) (ACUTE ONLY): 31 min  Charges:  $Therapeutic Activity: 23-37 mins                     Joycelyn Rua, PTA Acute Rehabilitation Services Pager [phone] Office [phone]     Elisabel Hanover Artis Delay 06/30/2018, 5:51 PM

## 2018-06-30 NOTE — Care Management (Signed)
Pt's case still under review with [facility] in [place], per admissions coordinator.  Provided updated clinical information, per request today.  Hopeful for possible decision on admission tomorrow.  Will follow with updates as available.    Quintella Baton, RN, BSN  Trauma/Neuro ICU Case Manager [phone]

## 2018-06-30 NOTE — Progress Notes (Signed)
Pharmacy Antibiotic Note  Jesse [name] is a 28 y.o. male admitted on 06/13/2018 with pneumonia.  Pharmacy has been consulted for meropenem dosing.   Resp culture with MSSA and Klebsiella. Klebsiella resulted ESBL, however, sensitive to cefepime (MIC 2).   5/7 Update: Meropenem stop date in for 5/12. Remains afebrile. Renal function stable. WBC 25>>17.1  Plan: Continue Meropenem 2g q8h until 5/12  Monitor renal function Pharmacy will continue to follow along.  Height: 6' (182.9 cm) Weight: 162 lb 4.1 oz (73.6 kg) IBW/kg (Calculated) : 77.6  Temp (24hrs), Avg:98.8 F (37.1 C), Min:98.3 F (36.8 C), Max:99.2 F (37.3 C)  Recent Labs  Lab 06/25/18 0350 06/26/18 0435  WBC 25.0* 17.1*  CREATININE 0.86 0.66    Estimated Creatinine Clearance: 146.9 mL/min (by C-G formula based on SCr of 0.66 mg/dL).    Allergies  Allergen Reactions  . Other Other (See Comments)    Seasonal allergies- Stuffy nose, itchy eyes, congestion, etc..    Antimicrobials this admission: Cefepime 5/2 >> 5/4 Merrem 5/4 >> (5/12)  Thank you for allowing pharmacy to be a part of this patient's care.  Wendelyn Breslow, PharmD PGY1 Pharmacy Resident Phone: [phone] 06/30/2018 2:46 PM

## 2018-07-01 LAB — GLUCOSE, CAPILLARY
Glucose-Capillary: 100 mg/dL — ABNORMAL HIGH (ref 70–99)
Glucose-Capillary: 105 mg/dL — ABNORMAL HIGH (ref 70–99)
Glucose-Capillary: 119 mg/dL — ABNORMAL HIGH (ref 70–99)
Glucose-Capillary: 120 mg/dL — ABNORMAL HIGH (ref 70–99)
Glucose-Capillary: 120 mg/dL — ABNORMAL HIGH (ref 70–99)
Glucose-Capillary: 99 mg/dL (ref 70–99)

## 2018-07-01 MED ORDER — OSMOLITE 1.5 CAL PO LIQD
1000.0000 mL | ORAL | Status: DC
  Administered 2018-07-01 – 2018-07-10 (×4): 1000 mL
  Filled 2018-07-01 (×19): qty 1000

## 2018-07-01 MED ORDER — CHLORHEXIDINE GLUCONATE 0.12 % MT SOLN
15.0000 mL | Freq: Two times a day (BID) | OROMUCOSAL | Status: DC
  Administered 2018-07-01 – 2018-07-09 (×16): 15 mL via OROMUCOSAL
  Filled 2018-07-01 (×15): qty 15

## 2018-07-01 NOTE — Care Management (Addendum)
1750 pm Notified by [facility] that pt has been denied for admission.  Rehab facility to notify pt's parents.  Will follow up on Monday.  Pt will ultimately need CIR vs. SNF.  Quintella Baton, RN, BSN  Trauma/Neuro ICU Case Manager [phone]      Spoke with Annia Belt with [facility]; medical team is still reviewing case and have not reached a decision on admission yet.  Difficult case as pt will need psychiatric and drug treatment at some point, which [facility] does not have.  Rehab coordinator hopes to have a decision by Monday.    I spoke with pt's mother earlier today, and she is frustrated by this wait, and feels that pt should be getting therapies daily.  Explained that therapy is generally every other day for inpatients, and this is why we are looking at rehab.  Provided emotional support to mother.    Quintella Baton, RN, BSN  Trauma/Neuro ICU Case Manager [phone]

## 2018-07-01 NOTE — Progress Notes (Signed)
RT NOTE: RT removed trach sutures per order and protocol with Marguerita Beards, RRT and RN at bedside. RT removed 2 sutures with other RT and RN verification. RT will continue to monitor.

## 2018-07-01 NOTE — Progress Notes (Signed)
PROGRESS NOTE    [place] David [name]  ION:[id number] DOB: January 15, 1991 DOA: 06/13/2018 PCP: No primary care provider on file.    Brief Narrative:  28 year old male who was found down in his vehicle, unresponsive, suspected postictal. He had several unmarked pills around him. Patient did not respond to naloxone. While at the emergency department he required intubation for airway protection. Upon admission to the intensive care unit he was hypotensive, required vasopressors.  He had a prolonged hospital stay, he was diagnosed with staph aureus and Klebsiella pneumonia. He continued to be encephalopathic, possible anoxic injury. He required tracheostomy April 30, in orderto be liberated from mechanical ventilation.  Assessment & Plan:   Active Problems:   Altered mental status   Acute respiratory failure with hypoxia (HCC)   Status post tracheostomy (HCC)   Drug overdose   Shock (HCC)   Goals of care, counseling/discussion   Palliative care by specialist   AKI (acute kidney injury) (HCC)   Tachypnea   Acute blood loss anemia   Drug addiction (HCC)   Anoxic brain injury (HCC)   Palliative care encounter   1. Acute hypoxic and hypercapnic respiratory failure due to encephalopathy, possible hypoxic brain injury.Tolerating well trach collar, managing secretions well. Has tube for feedings per NG. Continue Keppra for seizure prophylaxis. Remains non-verbal on my exam today. Family has been working on possible transfer to [place] facility. Pending decision on admission per case manager  2. Pneumonia (not present on admission)/ Klebsiella ESBL Continued with meropenem for antibiotic therapy #3/8,  Continue oxymetry monitoring and aspiration precautions. Now off steroids. Remains on minimal O2 support. Continue suctioning. Ample amounts of secretions noted this AM  3. Urinary retention. Continue foley for now as tolerated.   DVT prophylaxis: Lovenox subQ Code Status: Full Family  Communication: Pt in room, family not at bedside Disposition Plan: Uncertain at this time  Consultants:   Critical Care  Procedures:     Antimicrobials: Anti-infectives (From admission, onward)   Start     Dose/Rate Route Frequency Ordered Stop   06/27/18 1000  meropenem (MERREM) 2 g in sodium chloride 0.9 % 100 mL IVPB     2 g 200 mL/hr over 30 Minutes Intravenous Every 8 hours 06/27/18 0950 07/05/18 1759   06/25/18 0900  ceFEPIme (MAXIPIME) 2 g in sodium chloride 0.9 % 100 mL IVPB  Status:  Discontinued     2 g 200 mL/hr over 30 Minutes Intravenous Every 8 hours 06/25/18 0856 06/27/18 0936   06/14/18 1100  Ampicillin-Sulbactam (UNASYN) 3 g in sodium chloride 0.9 % 100 mL IVPB     3 g 200 mL/hr over 30 Minutes Intravenous Every 6 hours 06/14/18 1015 06/20/18 1858      Subjective: Looks around room, non-verbal  Objective: Vitals:   07/01/18 0825 07/01/18 1200 07/01/18 1237 07/01/18 1401  BP: 101/80 112/71 112/71   Pulse: 96 87 (!) 115   Resp: 16 17 (!) 96   Temp:  98.8 F (37.1 C)    TempSrc:  Axillary    SpO2: 96% 95% 96% 96%  Weight:      Height:        Intake/Output Summary (Last 24 hours) at 07/01/2018 1520 Last data filed at 07/01/2018 1000 Gross per 24 hour  Intake 1953.83 ml  Output 1861 ml  Net 92.83 ml   Filed Weights   06/29/18 0600 06/30/18 0447 07/01/18 0456  Weight: 80.7 kg 73.6 kg 73.7 kg    Examination: General exam: awake, not conversant,  in no acute distress Respiratory system: normal chest rise, clear, no audible wheezing Cardiovascular system: regular rhythm, s1-s2 Gastrointestinal system: Nondistended, nontender, pos BS Central nervous system: No seizures, no tremors Extremities: No cyanosis, no joint deformities Skin: No rashes, no pallor Psychiatry: unable to assess given non-verbal state   Data Reviewed: I have personally reviewed following labs and imaging studies  CBC: Recent Labs  Lab 06/25/18 0350 06/26/18 0435  WBC 25.0*  17.1*  NEUTROABS 20.8*  --   HGB 12.3* 12.1*  HCT 36.9* 36.0*  MCV 92.0 92.3  PLT 460* 480*   Basic Metabolic Panel: Recent Labs  Lab 06/25/18 0350 06/26/18 0435  NA 142 142  K 4.0 3.8  CL 103 107  CO2 28 24  GLUCOSE 102* 110*  BUN 29* 25*  CREATININE 0.86 0.66  CALCIUM 9.3 9.3  MG 2.1  --    GFR: Estimated Creatinine Clearance: 147.1 mL/min (by C-G formula based on SCr of 0.66 mg/dL). Liver Function Tests: Recent Labs  Lab 06/25/18 0350  AST 39  ALT 77*  ALKPHOS 70  BILITOT 1.2  PROT 6.9  ALBUMIN 3.1*   No results for input(s): LIPASE, AMYLASE in the last 168 hours. No results for input(s): AMMONIA in the last 168 hours. Coagulation Profile: No results for input(s): INR, PROTIME in the last 168 hours. Cardiac Enzymes: Recent Labs  Lab 06/25/18 0350  CKTOTAL 106   BNP (last 3 results) No results for input(s): PROBNP in the last 8760 hours. HbA1C: No results for input(s): HGBA1C in the last 72 hours. CBG: Recent Labs  Lab 06/30/18 2024 06/30/18 2310 07/01/18 0338 07/01/18 0743 07/01/18 1244  GLUCAP 111* 107* 105* 119* 120*   Lipid Profile: No results for input(s): CHOL, HDL, LDLCALC, TRIG, CHOLHDL, LDLDIRECT in the last 72 hours. Thyroid Function Tests: No results for input(s): TSH, T4TOTAL, FREET4, T3FREE, THYROIDAB in the last 72 hours. Anemia Panel: No results for input(s): VITAMINB12, FOLATE, FERRITIN, TIBC, IRON, RETICCTPCT in the last 72 hours. Sepsis Labs: Recent Labs  Lab 06/25/18 0350  PROCALCITON 0.12    Recent Results (from the past 240 hour(s))  Culture, respiratory (non-expectorated)     Status: None   Collection Time: 06/23/18  3:32 PM  Result Value Ref Range Status   Specimen Description TRACHEAL ASPIRATE  Final   Special Requests NONE  Final   Gram Stain   Final    ABUNDANT WBC PRESENT,BOTH PMN AND MONONUCLEAR MODERATE GRAM POSITIVE COCCI Performed at [facility], [address] [place], [place] [address]     Culture   Final    FEW STAPHYLOCOCCUS AUREUS FEW KLEBSIELLA PNEUMONIAE Confirmed Extended Spectrum Beta-Lactamase Producer (ESBL).  In bloodstream infections from ESBL organisms, carbapenems are preferred over piperacillin/tazobactam. They are shown to have a lower risk of mortality.    Report Status 06/26/2018 FINAL  Final   Organism ID, Bacteria STAPHYLOCOCCUS AUREUS  Final   Organism ID, Bacteria KLEBSIELLA PNEUMONIAE  Final      Susceptibility   Klebsiella pneumoniae - MIC*    AMPICILLIN >=32 RESISTANT Resistant     CEFAZOLIN >=64 RESISTANT Resistant     CEFEPIME 2 SENSITIVE Sensitive     CEFTAZIDIME 16 INTERMEDIATE Intermediate     CEFTRIAXONE >=64 RESISTANT Resistant     CIPROFLOXACIN 1 SENSITIVE Sensitive     GENTAMICIN <=1 SENSITIVE Sensitive     IMIPENEM <=0.25 SENSITIVE Sensitive     TRIMETH/SULFA >=320 RESISTANT Resistant     AMPICILLIN/SULBACTAM >=32 RESISTANT Resistant  PIP/TAZO 16 SENSITIVE Sensitive     Extended ESBL POSITIVE Resistant     * FEW KLEBSIELLA PNEUMONIAE   Staphylococcus aureus - MIC*    CIPROFLOXACIN <=0.5 SENSITIVE Sensitive     ERYTHROMYCIN >=8 RESISTANT Resistant     GENTAMICIN <=0.5 SENSITIVE Sensitive     OXACILLIN <=0.25 SENSITIVE Sensitive     TETRACYCLINE <=1 SENSITIVE Sensitive     VANCOMYCIN 1 SENSITIVE Sensitive     TRIMETH/SULFA <=10 SENSITIVE Sensitive     CLINDAMYCIN RESISTANT Resistant     RIFAMPIN <=0.5 SENSITIVE Sensitive     Inducible Clindamycin POSITIVE Resistant     * FEW STAPHYLOCOCCUS AUREUS     Radiology Studies: No results found.  Scheduled Meds: . bethanechol  10 mg Per Tube TID  . chlorhexidine  15 mL Mouth Rinse BID  . enoxaparin (LOVENOX) injection  40 mg Subcutaneous Q24H  . feeding supplement (PRO-STAT SUGAR FREE 64)  30 mL Per Tube Daily  . ipratropium-albuterol  3 mL Nebulization TID  . levETIRAcetam  500 mg Per Tube BID  . mouth rinse  15 mL Mouth Rinse 10 times per day  . propranolol  20 mg Per Tube  Q2000   Continuous Infusions: . sodium chloride 250 mL (06/27/18 0557)  . feeding supplement (OSMOLITE 1.5 CAL)    . meropenem (MERREM) IV 2 g (07/01/18 1043)     LOS: 18 days   Rickey Barbara, MD [facility] Pager On Amion  If 7PM-7AM, please contact night-coverage 07/01/2018, 3:20 PM

## 2018-07-01 NOTE — Progress Notes (Addendum)
Nutrition Follow-up   RD working remotely.  DOCUMENTATION CODES:   Not applicable  INTERVENTION:  Initiate Osmolite 1.5 @ 35 ml/hr via Cortrak NGT and increase by 10 ml every 4 hours to goal rate of 65 ml/hr.   30 ml Prostat once daily.    Tube feeding regimen provides 2440 kcal (100% of needs), 113 grams of protein, and 1186 ml of H2O.   NUTRITION DIAGNOSIS:   Inadequate oral intake related to inability to eat as evidenced by NPO status; ongoing  GOAL:   Patient will meet greater than or equal to 90% of their needs; met with TF  MONITOR:   Labs, Weight trends, I & O's, Skin, TF tolerance  REASON FOR ASSESSMENT:   Consult, Ventilator Enteral/tube feeding initiation and management  ASSESSMENT:   Pt with PMH of substance abuse and prior suicide attempt admitted 4/20 with suspected drug overdose (UDS positive on admission for amphetamines, cocaine, and benzos), suspected RLL aspiration PNA, and AKI after being found down in his car outside of a hotel.   Tracheostomy placed 4/30. Pt extubated to ATC 5/3. Cortrak NGT placed 5/1. Tip of tube in stomach. Pt remains NPO. Pt encephalopathic with possible anoxic brain injury. Pt nonverbal. Pt has been transfer to a progressive care unit from ICU. RD to modify tube feeds and will initiate a standard tube feeding formula. No indication for continuation of Vital 1.5 specialty formula since unit transfer. Pt has been tolerating his tube feeding. RD to continue to monitor for tolerance. Will transition to bolus tube feeds once new formula is tolerated as appropriate. Labs and medications reviewed.   Diet Order:   Diet Order    None      EDUCATION NEEDS:   No education needs have been identified at this time  Skin:  Skin Assessment: Reviewed RN Assessment  Last BM:  5/8  Height:   Ht Readings from Last 1 Encounters:  06/13/18 6' (1.829 m)    Weight:   Wt Readings from Last 1 Encounters:  07/01/18 73.7 kg    Ideal Body  Weight:  80.9 kg  BMI:  Body mass index is 22.04 kg/m.  Estimated Nutritional Needs:   Kcal:  2400-2600  Protein:  110-120 grams  Fluid:  > 2 L/day   Corrin Parker, MS, RD, LDN Pager # [phone] After hours/ weekend pager # [phone]

## 2018-07-02 ENCOUNTER — Encounter (HOSPITAL_COMMUNITY): Payer: Self-pay

## 2018-07-02 LAB — BASIC METABOLIC PANEL
Anion gap: 14 (ref 5–15)
BUN: 27 mg/dL — ABNORMAL HIGH (ref 6–20)
CO2: 27 mmol/L (ref 22–32)
Calcium: 9.9 mg/dL (ref 8.9–10.3)
Chloride: 100 mmol/L (ref 98–111)
Creatinine, Ser: 0.68 mg/dL (ref 0.61–1.24)
GFR calc Af Amer: >60 mL/min (ref >60)
GFR calc non Af Amer: >60 mL/min (ref >60)
Glucose, Bld: 107 mg/dL — ABNORMAL HIGH (ref 70–99)
Potassium: 4.1 mmol/L (ref 3.5–5.1)
Sodium: 141 mmol/L (ref 135–145)

## 2018-07-02 LAB — GLUCOSE, CAPILLARY
Glucose-Capillary: 101 mg/dL — ABNORMAL HIGH (ref 70–99)
Glucose-Capillary: 110 mg/dL — ABNORMAL HIGH (ref 70–99)
Glucose-Capillary: 112 mg/dL — ABNORMAL HIGH (ref 70–99)
Glucose-Capillary: 114 mg/dL — ABNORMAL HIGH (ref 70–99)
Glucose-Capillary: 115 mg/dL — ABNORMAL HIGH (ref 70–99)
Glucose-Capillary: 95 mg/dL (ref 70–99)

## 2018-07-02 NOTE — Progress Notes (Signed)
Assisted tele visit to patient with mother.  Jesse [name] P, RN  

## 2018-07-02 NOTE — Progress Notes (Signed)
PROGRESS NOTE    Jesse [name]  ZHY:[id number] DOB: March 30, 1990 DOA: 06/13/2018 PCP: No primary care provider on file.    Brief Narrative:  28 year old male who was found down in his vehicle, unresponsive, suspected postictal. He had several unmarked pills around him. Patient did not respond to naloxone. While at the emergency department he required intubation for airway protection. Upon admission to the intensive care unit he was hypotensive, required vasopressors.  He had a prolonged hospital stay, he was diagnosed with staph aureus and Klebsiella pneumonia. He continued to be encephalopathic, possible anoxic injury. He required tracheostomy April 30, in orderto be liberated from mechanical ventilation.  Assessment & Plan:   Active Problems:   Altered mental status   Acute respiratory failure with hypoxia (HCC)   Status post tracheostomy (HCC)   Drug overdose   Shock (HCC)   Goals of care, counseling/discussion   Palliative care by specialist   AKI (acute kidney injury) (HCC)   Tachypnea   Acute blood loss anemia   Drug addiction (HCC)   Anoxic brain injury (HCC)   Palliative care encounter   1. Acute hypoxic and hypercapnic respiratory failure due to encephalopathy, possible hypoxic brain injury.Tolerating well trach collar, managing secretions well. Has tube for feedings per NG. Continue Keppra for seizure prophylaxis. Remains non-verbal on my exam today. Family initially hoped for transfer to [facility]. Facility has decided to NOT accept pt in transfer. Anticipate SNF vs CIR  2. Pneumonia (not present on admission)/ Klebsiella ESBL Continued with meropenem for antibiotic therapy #3/8,  Continue oxymetry monitoring and aspiration precautions. Now off steroids. Remains on minimal O2 support. Continue suctioning.stable at present on RA  3. Urinary retention. Continue foley as tolerated   DVT prophylaxis: Lovenox subQ Code Status: Full Family  Communication: Pt in room, family not at bedside Disposition Plan: Uncertain at this time  Consultants:   Critical Care  Procedures:     Antimicrobials: Anti-infectives (From admission, onward)   Start     Dose/Rate Route Frequency Ordered Stop   06/27/18 1000  meropenem (MERREM) 2 g in sodium chloride 0.9 % 100 mL IVPB     2 g 200 mL/hr over 30 Minutes Intravenous Every 8 hours 06/27/18 0950 07/05/18 1759   06/25/18 0900  ceFEPIme (MAXIPIME) 2 g in sodium chloride 0.9 % 100 mL IVPB  Status:  Discontinued     2 g 200 mL/hr over 30 Minutes Intravenous Every 8 hours 06/25/18 0856 06/27/18 0936   06/14/18 1100  Ampicillin-Sulbactam (UNASYN) 3 g in sodium chloride 0.9 % 100 mL IVPB     3 g 200 mL/hr over 30 Minutes Intravenous Every 6 hours 06/14/18 1015 06/20/18 1858      Subjective: Looks around room, non-verbal  Objective: Vitals:   07/02/18 1200 07/02/18 1300 07/02/18 1400 07/02/18 1423  BP: 118/76     Pulse: 99 88 96   Resp: Temp: 99.2 F (37.3 C)     TempSrc: Axillary     SpO2: 96% 95% 97% 96%  Weight:      Height:        Intake/Output Summary (Last 24 hours) at 07/02/2018 1506 Last data filed at 07/02/2018 1200 Gross per 24 hour  Intake 1015 ml  Output 1360 ml  Net -345 ml   Filed Weights   06/30/18 0447 07/01/18 0456 07/02/18 0500  Weight: 73.6 kg 73.7 kg 72.3 kg    Examination: General exam: Awake, laying in bed, in nad  Respiratory system: Normal respiratory effort, no wheezing Cardiovascular system: regular rate, s1, s2 Gastrointestinal system: Soft, nondistended, positive BS Central nervous system: CN2-12 grossly intact, strength intact Extremities: Perfused, no clubbing Skin: Normal skin turgor, no notable skin lesions seen Psychiatry: unable to assess given nonverbal state   Data Reviewed: I have personally reviewed following labs and imaging studies  CBC: Recent Labs  Lab 06/26/18 0435  WBC 17.1*  HGB 12.1*  HCT 36.0*  MCV  92.3  PLT 480*   Basic Metabolic Panel: Recent Labs  Lab 06/26/18 0435 07/02/18 0332  NA 142 141  K 3.8 4.1  CL 107 100  CO2 24 27  GLUCOSE 110* 107*  BUN 25* 27*  CREATININE 0.66 0.68  CALCIUM 9.3 9.9   GFR: Estimated Creatinine Clearance: 144.3 mL/min (by C-G formula based on SCr of 0.68 mg/dL). Liver Function Tests: No results for input(s): AST, ALT, ALKPHOS, BILITOT, PROT, ALBUMIN in the last 168 hours. No results for input(s): LIPASE, AMYLASE in the last 168 hours. No results for input(s): AMMONIA in the last 168 hours. Coagulation Profile: No results for input(s): INR, PROTIME in the last 168 hours. Cardiac Enzymes: No results for input(s): CKTOTAL, CKMB, CKMBINDEX, TROPONINI in the last 168 hours. BNP (last 3 results) No results for input(s): PROBNP in the last 8760 hours. HbA1C: No results for input(s): HGBA1C in the last 72 hours. CBG: Recent Labs  Lab 07/01/18 2034 07/01/18 2358 07/02/18 0507 07/02/18 0806 07/02/18 1201  GLUCAP 120* 100* 110* 114* 115*   Lipid Profile: No results for input(s): CHOL, HDL, LDLCALC, TRIG, CHOLHDL, LDLDIRECT in the last 72 hours. Thyroid Function Tests: No results for input(s): TSH, T4TOTAL, FREET4, T3FREE, THYROIDAB in the last 72 hours. Anemia Panel: No results for input(s): VITAMINB12, FOLATE, FERRITIN, TIBC, IRON, RETICCTPCT in the last 72 hours. Sepsis Labs: No results for input(s): PROCALCITON, LATICACIDVEN in the last 168 hours.  Recent Results (from the past 240 hour(s))  Culture, respiratory (non-expectorated)     Status: None   Collection Time: 06/23/18  3:32 PM  Result Value Ref Range Status   Specimen Description TRACHEAL ASPIRATE  Final   Special Requests NONE  Final   Gram Stain   Final    ABUNDANT WBC PRESENT,BOTH PMN AND MONONUCLEAR MODERATE GRAM POSITIVE COCCI Performed at [facility], [address] [place], [place] [address]    Culture   Final    FEW STAPHYLOCOCCUS AUREUS FEW KLEBSIELLA  PNEUMONIAE Confirmed Extended Spectrum Beta-Lactamase Producer (ESBL).  In bloodstream infections from ESBL organisms, carbapenems are preferred over piperacillin/tazobactam. They are shown to have a lower risk of mortality.    Report Status 06/26/2018 FINAL  Final   Organism ID, Bacteria STAPHYLOCOCCUS AUREUS  Final   Organism ID, Bacteria KLEBSIELLA PNEUMONIAE  Final      Susceptibility   Klebsiella pneumoniae - MIC*    AMPICILLIN >=32 RESISTANT Resistant     CEFAZOLIN >=64 RESISTANT Resistant     CEFEPIME 2 SENSITIVE Sensitive     CEFTAZIDIME 16 INTERMEDIATE Intermediate     CEFTRIAXONE >=64 RESISTANT Resistant     CIPROFLOXACIN 1 SENSITIVE Sensitive     GENTAMICIN <=1 SENSITIVE Sensitive     IMIPENEM <=0.25 SENSITIVE Sensitive     TRIMETH/SULFA >=320 RESISTANT Resistant     AMPICILLIN/SULBACTAM >=32 RESISTANT Resistant     PIP/TAZO 16 SENSITIVE Sensitive     Extended ESBL POSITIVE Resistant     * FEW KLEBSIELLA PNEUMONIAE   Staphylococcus aureus - MIC*  CIPROFLOXACIN <=0.5 SENSITIVE Sensitive     ERYTHROMYCIN >=8 RESISTANT Resistant     GENTAMICIN <=0.5 SENSITIVE Sensitive     OXACILLIN <=0.25 SENSITIVE Sensitive     TETRACYCLINE <=1 SENSITIVE Sensitive     VANCOMYCIN 1 SENSITIVE Sensitive     TRIMETH/SULFA <=10 SENSITIVE Sensitive     CLINDAMYCIN RESISTANT Resistant     RIFAMPIN <=0.5 SENSITIVE Sensitive     Inducible Clindamycin POSITIVE Resistant     * FEW STAPHYLOCOCCUS AUREUS     Radiology Studies: No results found.  Scheduled Meds: . bethanechol  10 mg Per Tube TID  . chlorhexidine  15 mL Mouth Rinse BID  . enoxaparin (LOVENOX) injection  40 mg Subcutaneous Q24H  . feeding supplement (PRO-STAT SUGAR FREE 64)  30 mL Per Tube Daily  . ipratropium-albuterol  3 mL Nebulization TID  . levETIRAcetam  500 mg Per Tube BID  . mouth rinse  15 mL Mouth Rinse 10 times per day  . propranolol  20 mg Per Tube Q2000   Continuous Infusions: . sodium chloride 250 mL  (06/27/18 0557)  . feeding supplement (OSMOLITE 1.5 CAL) 1,000 mL (07/02/18 0936)  . meropenem (MERREM) IV 2 g (07/02/18 0920)     LOS: 19 days   Rickey BarbaraStephen Kinsey Karch, MD [facility] Pager On Amion  If 7PM-7AM, please contact night-coverage 07/02/2018, 3:06 PM

## 2018-07-03 LAB — GLUCOSE, CAPILLARY
Glucose-Capillary: 112 mg/dL — ABNORMAL HIGH (ref 70–99)
Glucose-Capillary: 105 mg/dL — ABNORMAL HIGH (ref 70–99)
Glucose-Capillary: 124 mg/dL — ABNORMAL HIGH (ref 70–99)
Glucose-Capillary: 109 mg/dL — ABNORMAL HIGH (ref 70–99)
Glucose-Capillary: 98 mg/dL (ref 70–99)

## 2018-07-03 NOTE — Progress Notes (Signed)
Video call with dad, Jesse [name] completes. Patient looking at camera but not responding.

## 2018-07-03 NOTE — Progress Notes (Signed)
Assisted with elink camera/video time with girlfriend

## 2018-07-03 NOTE — Progress Notes (Signed)
Pharmacy Antibiotic Note  Jesse [name] is a 28 y.o. male admitted on 06/13/2018 with pneumonia.  Pharmacy has been consulted for meropenem dosing.   Resp culture with MSSA and Klebsiella. Klebsiella resulted ESBL, however, sensitive to cefepime (MIC 2).   5/10 Update: Meropenem stop date in for 5/12. Remains afebrile. Renal function stable. WBC trending down.   Plan: Continue Meropenem 2g q8h until 5/12  Monitor renal function Pharmacy will continue to follow along.  Height: 6' (182.9 cm) Weight: 159 lb 9.8 oz (72.4 kg) IBW/kg (Calculated) : 77.6  Temp (24hrs), Avg:98.2 F (36.8 C), Min:97.6 F (36.4 C), Max:99.2 F (37.3 C)  Recent Labs  Lab 07/02/18 0332  CREATININE 0.68    Estimated Creatinine Clearance: 144.5 mL/min (by C-G formula based on SCr of 0.68 mg/dL).    Allergies  Allergen Reactions  . Other Other (See Comments)    Seasonal allergies- Stuffy nose, itchy eyes, congestion, etc..    Antimicrobials this admission: Cefepime 5/2 >> 5/4 Merrem 5/4 >> (5/12)  Thank you for allowing pharmacy to be a part of this patient's care.  Link Snuffer, PharmD, BCPS, BCCCP Clinical Pharmacist Please refer to [facility] for [facility] Pharmacy numbers 07/03/2018 7:13 AM

## 2018-07-03 NOTE — Progress Notes (Signed)
PROGRESS NOTE    Jesse [name]  ZOX:[id number] DOB: [birth year] DOA: 06/13/2018 PCP: No primary care provider on file.    Brief Narrative:  28 year old male who was found down in his vehicle, unresponsive, suspected postictal. He had several unmarked pills around him. Patient did not respond to naloxone. While at the emergency department he required intubation for airway protection. Upon admission to the intensive care unit he was hypotensive, required vasopressors.  He had a prolonged hospital stay, he was diagnosed with staph aureus and Klebsiella pneumonia. He continued to be encephalopathic, possible anoxic injury. He required tracheostomy April 30, in orderto be liberated from mechanical ventilation.  Assessment & Plan:   Active Problems:   Altered mental status   Acute respiratory failure with hypoxia (HCC)   Status post tracheostomy (HCC)   Drug overdose   Shock (HCC)   Goals of care, counseling/discussion   Palliative care by specialist   AKI (acute kidney injury) (HCC)   Tachypnea   Acute blood loss anemia   Drug addiction (HCC)   Anoxic brain injury (HCC)   Palliative care encounter   1. Acute hypoxic and hypercapnic respiratory failure due to encephalopathy, possible hypoxic brain injury.Tolerating well trach collar, managing secretions well. Has tube for feedings per NG. Continue Keppra for seizure prophylaxis. Remains non-verbal on my exam today. Family initially hoped for transfer to [facility] facility. Facility has decided to NOT accept pt in transfer. Follow up on SW for possible SNF vs CIR  2. Pneumonia (not present on admission)/ Klebsiella ESBL Continued with meropenem for antibiotic therapy #3/8,  Continue oxymetry monitoring and aspiration precautions. Now off steroids. Remains on minimal O2 support. Continue suctioning.stable at present on RA. Stable at present  3. Urinary retention. Patient is continued with foley cath  DVT prophylaxis:  Lovenox subQ Code Status: Full Family Communication: Pt in room, family not at bedside Disposition Plan: Uncertain at this time  Consultants:   Critical Care  Procedures:     Antimicrobials: Anti-infectives (From admission, onward)   Start     Dose/Rate Route Frequency Ordered Stop   06/27/18 1000  meropenem (MERREM) 2 g in sodium chloride 0.9 % 100 mL IVPB     2 g 200 mL/hr over 30 Minutes Intravenous Every 8 hours 06/27/18 0950 07/05/18 1759   06/25/18 0900  ceFEPIme (MAXIPIME) 2 g in sodium chloride 0.9 % 100 mL IVPB  Status:  Discontinued     2 g 200 mL/hr over 30 Minutes Intravenous Every 8 hours 06/25/18 0856 06/27/18 0936   06/14/18 1100  Ampicillin-Sulbactam (UNASYN) 3 g in sodium chloride 0.9 % 100 mL IVPB     3 g 200 mL/hr over 30 Minutes Intravenous Every 6 hours 06/14/18 1015 06/20/18 1858      Subjective: Awake, looking around. Not verbal  Objective: Vitals:   07/03/18 1200 07/03/18 1220 07/03/18 1300 07/03/18 1400  BP: 128/74 128/74    Pulse: 97  (!) 105   Resp: 14 16 17    Temp:  99.7 F (37.6 C)  99.5 F (37.5 C)  TempSrc:  Oral  Axillary  SpO2: 96%  96%   Weight:      Height:        Intake/Output Summary (Last 24 hours) at 07/03/2018 1441 Last data filed at 07/03/2018 1400 Gross per 24 hour  Intake 1013.3 ml  Output 2150 ml  Net -1136.7 ml   Filed Weights   07/01/18 0456 07/02/18 0500 07/03/18 0500  Weight: 73.7 kg 72.3 kg  72.4 kg    Examination: General exam: Awake, in no acute distress Respiratory system: normal chest rise, clear, no audible wheezing Cardiovascular system: regular rhythm, s1-s2 Gastrointestinal system: Nondistended, nontender, pos BS Central nervous system: No seizures, no tremors Extremities: No cyanosis, no joint deformities Skin: No rashes, no pallor Psychiatry: Unable to assess given nonverbal state  Data Reviewed: I have personally reviewed following labs and imaging studies  CBC: No results for input(s):  WBC, NEUTROABS, HGB, HCT, MCV, PLT in the last 168 hours. Basic Metabolic Panel: Recent Labs  Lab 07/02/18 0332  NA 141  K 4.1  CL 100  CO2 27  GLUCOSE 107*  BUN 27*  CREATININE 0.68  CALCIUM 9.9   GFR: Estimated Creatinine Clearance: 144.5 mL/min (by C-G formula based on SCr of 0.68 mg/dL). Liver Function Tests: No results for input(s): AST, ALT, ALKPHOS, BILITOT, PROT, ALBUMIN in the last 168 hours. No results for input(s): LIPASE, AMYLASE in the last 168 hours. No results for input(s): AMMONIA in the last 168 hours. Coagulation Profile: No results for input(s): INR, PROTIME in the last 168 hours. Cardiac Enzymes: No results for input(s): CKTOTAL, CKMB, CKMBINDEX, TROPONINI in the last 168 hours. BNP (last 3 results) No results for input(s): PROBNP in the last 8760 hours. HbA1C: No results for input(s): HGBA1C in the last 72 hours. CBG: Recent Labs  Lab 07/02/18 2015 07/02/18 2340 07/03/18 0334 07/03/18 0811 07/03/18 1219  GLUCAP 95 101* 112* 105* 124*   Lipid Profile: No results for input(s): CHOL, HDL, LDLCALC, TRIG, CHOLHDL, LDLDIRECT in the last 72 hours. Thyroid Function Tests: No results for input(s): TSH, T4TOTAL, FREET4, T3FREE, THYROIDAB in the last 72 hours. Anemia Panel: No results for input(s): VITAMINB12, FOLATE, FERRITIN, TIBC, IRON, RETICCTPCT in the last 72 hours. Sepsis Labs: No results for input(s): PROCALCITON, LATICACIDVEN in the last 168 hours.  Recent Results (from the past 240 hour(s))  Culture, respiratory (non-expectorated)     Status: None   Collection Time: 06/23/18  3:32 PM  Result Value Ref Range Status   Specimen Description TRACHEAL ASPIRATE  Final   Special Requests NONE  Final   Gram Stain   Final    ABUNDANT WBC PRESENT,BOTH PMN AND MONONUCLEAR MODERATE GRAM POSITIVE COCCI Performed at [facility], [address] [place], [place] [address]    Culture   Final    FEW STAPHYLOCOCCUS AUREUS FEW KLEBSIELLA PNEUMONIAE  Confirmed Extended Spectrum Beta-Lactamase Producer (ESBL).  In bloodstream infections from ESBL organisms, carbapenems are preferred over piperacillin/tazobactam. They are shown to have a lower risk of mortality.    Report Status 06/26/2018 FINAL  Final   Organism ID, Bacteria STAPHYLOCOCCUS AUREUS  Final   Organism ID, Bacteria KLEBSIELLA PNEUMONIAE  Final      Susceptibility   Klebsiella pneumoniae - MIC*    AMPICILLIN >=32 RESISTANT Resistant     CEFAZOLIN >=64 RESISTANT Resistant     CEFEPIME 2 SENSITIVE Sensitive     CEFTAZIDIME 16 INTERMEDIATE Intermediate     CEFTRIAXONE >=64 RESISTANT Resistant     CIPROFLOXACIN 1 SENSITIVE Sensitive     GENTAMICIN <=1 SENSITIVE Sensitive     IMIPENEM <=0.25 SENSITIVE Sensitive     TRIMETH/SULFA >=320 RESISTANT Resistant     AMPICILLIN/SULBACTAM >=32 RESISTANT Resistant     PIP/TAZO 16 SENSITIVE Sensitive     Extended ESBL POSITIVE Resistant     * FEW KLEBSIELLA PNEUMONIAE   Staphylococcus aureus - MIC*    CIPROFLOXACIN <=0.5 SENSITIVE Sensitive  ERYTHROMYCIN >=8 RESISTANT Resistant     GENTAMICIN <=0.5 SENSITIVE Sensitive     OXACILLIN <=0.25 SENSITIVE Sensitive     TETRACYCLINE <=1 SENSITIVE Sensitive     VANCOMYCIN 1 SENSITIVE Sensitive     TRIMETH/SULFA <=10 SENSITIVE Sensitive     CLINDAMYCIN RESISTANT Resistant     RIFAMPIN <=0.5 SENSITIVE Sensitive     Inducible Clindamycin POSITIVE Resistant     * FEW STAPHYLOCOCCUS AUREUS     Radiology Studies: No results found.  Scheduled Meds: . bethanechol  10 mg Per Tube TID  . chlorhexidine  15 mL Mouth Rinse BID  . enoxaparin (LOVENOX) injection  40 mg Subcutaneous Q24H  . feeding supplement (PRO-STAT SUGAR FREE 64)  30 mL Per Tube Daily  . ipratropium-albuterol  3 mL Nebulization TID  . levETIRAcetam  500 mg Per Tube BID  . mouth rinse  15 mL Mouth Rinse 10 times per day  . propranolol  20 mg Per Tube Q2000   Continuous Infusions: . sodium chloride 250 mL (06/27/18 0557)   . feeding supplement (OSMOLITE 1.5 CAL) 65 mL/hr at 07/03/18 1400  . meropenem (MERREM) IV 2 g (07/03/18 9604)     LOS: 20 days   Rickey Barbara, MD [facility] Pager On Amion  If 7PM-7AM, please contact night-coverage 07/03/2018, 2:41 PM

## 2018-07-04 ENCOUNTER — Inpatient Hospital Stay (HOSPITAL_COMMUNITY): Payer: BLUE CROSS/BLUE SHIELD

## 2018-07-04 LAB — GLUCOSE, CAPILLARY
Glucose-Capillary: 130 mg/dL — ABNORMAL HIGH (ref 70–99)
Glucose-Capillary: 109 mg/dL — ABNORMAL HIGH (ref 70–99)
Glucose-Capillary: 127 mg/dL — ABNORMAL HIGH (ref 70–99)

## 2018-07-04 NOTE — Progress Notes (Signed)
Assisted camera/video time with pt's girlfriend via elink

## 2018-07-04 NOTE — Progress Notes (Signed)
Assisted camera/video time via elink with pt's dad

## 2018-07-04 NOTE — Progress Notes (Signed)
Inpatient Rehabilitation-Admissions Coordinator   Per request from CM, [facility] contacted pt's mother Saddiq Ehrhart regarding CIR at [facility]. AC briefly discussed program details but states coworker Ottie Glazier would follow up with her more tomorrow regarding qualifications and candidacy. Pt's mom states that her first choice is actually [facility] due to location for ease of visit/access. CM Raynelle Fanning made aware.   AC to continue to follow in case pt is denied CIR at [facility].   Nanine Means, OTR/L  Rehab Admissions Coordinator  [phone] 07/04/2018 11:18 AM

## 2018-07-04 NOTE — Progress Notes (Signed)
Assisted dad with elink camera/video time with pt

## 2018-07-04 NOTE — Progress Notes (Signed)
PROGRESS NOTE    Jesse [name]  GNF:[id number] DOB: 16-Feb-1991 DOA: 06/13/2018 PCP: No primary care provider on file.    Brief Narrative:  28 year old male who was found down in his vehicle, unresponsive, suspected postictal. He had several unmarked pills around him. Patient did not respond to naloxone. While at the emergency department he required intubation for airway protection. Upon admission to the intensive care unit he was hypotensive, required vasopressors.  He had a prolonged hospital stay, he was diagnosed with staph aureus and Klebsiella pneumonia. He continued to be encephalopathic, possible anoxic injury. He required tracheostomy April 30, in orderto be liberated from mechanical ventilation.  Assessment & Plan:   Active Problems:   Altered mental status   Acute respiratory failure with hypoxia (HCC)   Status post tracheostomy (HCC)   Drug overdose   Shock (HCC)   Goals of care, counseling/discussion   Palliative care by specialist   AKI (acute kidney injury) (HCC)   Tachypnea   Acute blood loss anemia   Drug addiction (HCC)   Anoxic brain injury (HCC)   Palliative care encounter   1. Acute hypoxic and hypercapnic respiratory failure due to encephalopathy, possible hypoxic brain injury.Tolerating well trach collar, managing secretions well. Has tube for feedings per NG. Continue Keppra for seizure prophylaxis. Remains non-verbal on my exam today. Family initially hoped for transfer to [facility] facility. Facility has decided to NOT accept pt in transfer. Follow up on SW for possible SNF vs CIR  2. Pneumonia (not present on admission)/ Klebsiella ESBL Continued with meropenem for antibiotic therapy,  Continue oxymetry monitoring and aspiration precautions. Now off steroids. Remains on minimal O2 support. Continue suctioning.stable at present on RA.  -increased mucus production noted. Will try chest PT -Pt noted to be more tachycardic. Will check cxr,  ordered and reviewed. Focus of apparent PNA in R base. Would continue abx  3. Urinary retention. Patient is continued with foley cath  DVT prophylaxis: Lovenox subQ Code Status: Full Family Communication: Pt in room, family not at bedside Disposition Plan: Uncertain at this time  Consultants:   Critical Care  Procedures:     Antimicrobials: Anti-infectives (From admission, onward)   Start     Dose/Rate Route Frequency Ordered Stop   06/27/18 1000  meropenem (MERREM) 2 g in sodium chloride 0.9 % 100 mL IVPB     2 g 200 mL/hr over 30 Minutes Intravenous Every 8 hours 06/27/18 0950 07/05/18 1759   06/25/18 0900  ceFEPIme (MAXIPIME) 2 g in sodium chloride 0.9 % 100 mL IVPB  Status:  Discontinued     2 g 200 mL/hr over 30 Minutes Intravenous Every 8 hours 06/25/18 0856 06/27/18 0936   06/14/18 1100  Ampicillin-Sulbactam (UNASYN) 3 g in sodium chloride 0.9 % 100 mL IVPB     3 g 200 mL/hr over 30 Minutes Intravenous Every 6 hours 06/14/18 1015 06/20/18 1858      Subjective: Awake, non-verbal  Objective: Vitals:   07/04/18 0832 07/04/18 1205 07/04/18 1227 07/04/18 1542  BP: 118/73  114/79   Pulse: 97 (!) 117 (!) 115 (!) 115  Resp: 14 15 15 19   Temp: 99.5 F (37.5 C)  99.8 F (37.7 C)   TempSrc: Axillary  Axillary   SpO2: 94% 96% 95% 92%  Weight:      Height:        Intake/Output Summary (Last 24 hours) at 07/04/2018 1549 Last data filed at 07/04/2018 1511 Gross per 24 hour  Intake 1325 ml  Output 2700 ml  Net -1375 ml   Filed Weights   07/02/18 0500 07/03/18 0500 07/04/18 0400  Weight: 72.3 kg 72.4 kg 72.3 kg    Examination: General exam: awake, in no acute distress Respiratory system: normal chest rise, clear, no audible wheezing Cardiovascular system: regular rhythm, s1-s2 Gastrointestinal system: Nondistended, nontender, pos BS Central nervous system: No seizures, no tremors Extremities: No cyanosis, no joint deformities Skin: No rashes, no pallor  Psychiatry: Unable to assess given nonverbal state  Data Reviewed: I have personally reviewed following labs and imaging studies  CBC: No results for input(s): WBC, NEUTROABS, HGB, HCT, MCV, PLT in the last 168 hours. Basic Metabolic Panel: Recent Labs  Lab 07/02/18 0332  NA 141  K 4.1  CL 100  CO2 27  GLUCOSE 107*  BUN 27*  CREATININE 0.68  CALCIUM 9.9   GFR: Estimated Creatinine Clearance: 144.3 mL/min (by C-G formula based on SCr of 0.68 mg/dL). Liver Function Tests: No results for input(s): AST, ALT, ALKPHOS, BILITOT, PROT, ALBUMIN in the last 168 hours. No results for input(s): LIPASE, AMYLASE in the last 168 hours. No results for input(s): AMMONIA in the last 168 hours. Coagulation Profile: No results for input(s): INR, PROTIME in the last 168 hours. Cardiac Enzymes: No results for input(s): CKTOTAL, CKMB, CKMBINDEX, TROPONINI in the last 168 hours. BNP (last 3 results) No results for input(s): PROBNP in the last 8760 hours. HbA1C: No results for input(s): HGBA1C in the last 72 hours. CBG: Recent Labs  Lab 07/03/18 1219 07/03/18 1624 07/03/18 2125 07/04/18 0148 07/04/18 0517  GLUCAP 124* 109* 98 130* 109*   Lipid Profile: No results for input(s): CHOL, HDL, LDLCALC, TRIG, CHOLHDL, LDLDIRECT in the last 72 hours. Thyroid Function Tests: No results for input(s): TSH, T4TOTAL, FREET4, T3FREE, THYROIDAB in the last 72 hours. Anemia Panel: No results for input(s): VITAMINB12, FOLATE, FERRITIN, TIBC, IRON, RETICCTPCT in the last 72 hours. Sepsis Labs: No results for input(s): PROCALCITON, LATICACIDVEN in the last 168 hours.  No results found for this or any previous visit (from the past 240 hour(s)).   Radiology Studies: Dg Chest Port 1 View  Result Date: 07/04/2018 CLINICAL DATA:  Difficulty breathing the EXAM: PORTABLE CHEST 1 VIEW COMPARISON:  Jun 25, 2018 FINDINGS: Tracheostomy catheter tip is 3.8 cm above the carina. Feeding tube tip is in the distal  stomach. No pneumothorax. There is a focal area of airspace consolidation in the medial right base inferiorly. Lungs elsewhere are clear. Heart size and pulmonary vascularity are normal. No adenopathy. No bone lesions. IMPRESSION: Tube and catheter positions as described without pneumothorax. Small area of apparent pneumonia in the inferior aspect medial right base. Lungs elsewhere are clear. Heart size normal. No evident adenopathy. Electronically Signed   By: Bretta Bang III M.D.   On: 07/04/2018 14:28    Scheduled Meds: . bethanechol  10 mg Per Tube TID  . chlorhexidine  15 mL Mouth Rinse BID  . enoxaparin (LOVENOX) injection  40 mg Subcutaneous Q24H  . feeding supplement (PRO-STAT SUGAR FREE 64)  30 mL Per Tube Daily  . ipratropium-albuterol  3 mL Nebulization TID  . levETIRAcetam  500 mg Per Tube BID  . mouth rinse  15 mL Mouth Rinse 10 times per day  . propranolol  20 mg Per Tube Q2000   Continuous Infusions: . sodium chloride 250 mL (07/04/18 0138)  . feeding supplement (OSMOLITE 1.5 CAL) 65 mL/hr at 07/03/18 2357  . meropenem (MERREM) IV 2 g (07/04/18  1028)     LOS: 21 days   Rickey BarbaraStephen Marquetta Weiskopf, MD [facility] Pager On Amion  If 7PM-7AM, please contact night-coverage 07/04/2018, 3:49 PM

## 2018-07-04 NOTE — Care Management (Signed)
Spoke with pt's mother, Celio Hendriks, to discuss discharge planning.  She is now interested in [facility] CIR for pt rehab venue.  Will ask Ottie Glazier, admissions coordinator to call pt's mother to discuss rehab/answer questions.    Laurance Pluim Phone:  [phone]  Quintella Baton, RN, BSN  Trauma/Neuro ICU Case Manager [phone]

## 2018-07-04 NOTE — Progress Notes (Signed)
Occupational Therapy Treatment Patient Details Name: Jesse [name] MRN: [id number] DOB: 09-Aug-1990 Today's Date: 07/04/2018    History of present illness 28 year old male found down in car with suspected drug overdose versus status epilepticus. Intubated 4/20. MRI showing multiple areas of restricted diffusion, predominantly in the cerebellar hemispheres which might be representative of hypoxic/anoxic injury but specifically in bilateral globus pallidus and lentiform nuclei on the right. No significant medical past history but history of alcohol and drug abuse.    OT comments  Pt seen in conjunction with PT and SLP.  Pt incontinent of stool.  Assisted with peri care.  Pt moved to EOB sitting with total A +2, HR increased to 148 and sustained in the 140s with sats decreasing to 87%.  He sat EOB x ~5 mins.  He was returned to supine and expelled large amount of mucous.  HR decreased to 116.   He will follow one step commands with Rt UE with a delay, and appears to blink eyes for "yes" response, but unable to determine accuracy.  MD and RN made aware of elevated HR during session.   Follow Up Recommendations  CIR;Supervision/Assistance - 24 hour    Equipment Recommendations  None recommended by OT    Recommendations for Other Services Rehab consult    Precautions / Restrictions Precautions Precautions: Fall Precaution Comments: trach       Mobility Bed Mobility Overal bed mobility: Needs Assistance Bed Mobility: Rolling;Sidelying to Sit;Sit to Sidelying Rolling: Total assist;+2 for physical assistance Sidelying to sit: Total assist;+2 for physical assistance;+2 for safety/equipment     Sit to sidelying: Total assist;+2 for physical assistance;+2 for safety/equipment General bed mobility comments: Pt unable to assist with bed mobility today   Transfers                 General transfer comment: Did not attempt today     Balance Overall balance assessment: Needs  assistance Sitting-balance support: Feet supported;Bilateral upper extremity supported Sitting balance-Leahy Scale: Zero Sitting balance - Comments: Pt with audible congestion (RN had attempted to suction prior to session).  Upon moving to EOB, HR increased from 111 to 146 and sustained in the 140s, with 02 sat decreasing to 87% on 28% Fi02 via trach collar.  02 sats increased to 60% with sats improving to low 90s and he was returned to supine.  He then coughed and expelled large volume of mucous.  HR decreased to 117, and 02 sats 96% on 28% Fi02        Standing balance comment: did not attempt this date due to increased HR upon sitting                            ADL either performed or assessed with clinical judgement   ADL Overall ADL's : Needs assistance/impaired                             Toileting- Clothing Manipulation and Hygiene: Total assistance;+2 for physical assistance;Bed level Toileting - Clothing Manipulation Details (indicate cue type and reason): Pt incontinent of large amount of stool. He was assisted with peri care in supine - required total A              Vision       Perception     Praxis      Cognition Arousal/Alertness: Lethargic Behavior During Therapy: Flat affect Overall Cognitive  Status: Impaired/Different from baseline Area of Impairment: Attention;Following commands                   Current Attention Level: Focused   Following Commands: Follows one step commands inconsistently;Follows one step commands with increased time     Problem Solving: Slow processing;Decreased initiation;Requires verbal cues;Requires tactile cues General Comments: Pt tracks therapists around room.  He will follow one step commands with Rt UE only today, but demonstrates at least 15 second delay.          Exercises     Shoulder Instructions       General Comments see comments under sitting balance     Pertinent Vitals/ Pain        Pain Assessment: Faces Faces Pain Scale: Hurts little more Pain Location: Pt repeatedly moves Rt hand over abdomen.  When asked if he had pain, he blinked for "yes" response, and "yes" for bellly - unsure of reliability.    Pain Intervention(s): Repositioned;Limited activity within patient's tolerance;Monitored during session  Home Living                                          Prior Functioning/Environment              Frequency  Min 3X/week        Progress Toward Goals  OT Goals(current goals can now be found in the care plan section)  Progress towards OT goals: Not progressing toward goals - comment(elevated HR with activity.  date for goal achievement extend)  Acute Rehab OT Goals OT Goal Formulation: Patient unable to participate in goal setting Time For Goal Achievement: 07/18/18 Potential to Achieve Goals: Good  Plan Discharge plan remains appropriate    Co-evaluation    PT/OT/SLP Co-Evaluation/Treatment: Yes Reason for Co-Treatment: Complexity of the patient's impairments (multi-system involvement);For patient/therapist safety;Necessary to address cognition/behavior during functional activity   OT goals addressed during session: Strengthening/ROM      AM-PAC OT "6 Clicks" Daily Activity     Outcome Measure   Help from another person eating meals?: Total Help from another person taking care of personal grooming?: Total Help from another person toileting, which includes using toliet, bedpan, or urinal?: Total Help from another person bathing (including washing, rinsing, drying)?: Total Help from another person to put on and taking off regular upper body clothing?: Total Help from another person to put on and taking off regular lower body clothing?: Total 6 Click Score: 6    End of Session Equipment Utilized During Treatment: Oxygen  OT Visit Diagnosis: Muscle weakness (generalized) (M62.81);Other symptoms and signs involving  cognitive function   Activity Tolerance Treatment limited secondary to medical complications (Comment)(Pt with increased HR with activity )   Patient Left in bed;with call bell/phone within reach;with bed alarm set   Nurse Communication Mobility status;Other (comment)(increased HR )        Time: 1779-3903 OT Time Calculation (min): 40 min  Charges: OT General Charges $OT Visit: 1 Visit OT Treatments $Self Care/Home Management : 8-22 mins $Therapeutic Activity: 8-22 mins  [name], OTR/L Acute Rehabilitation Services Pager [phone] Office [phone]    [name] M 07/04/2018, 11:46 AM

## 2018-07-04 NOTE — Care Management (Signed)
Mother states that pt's parents have decided to pursue rehab at [facility] in [place], as they have a brain injury rehab available. Faxed referral to [facility] admissions, fax [phone], and left message on voicemail for admissions coordinator.  Will follow up with updates as available.  Quintella Baton, RN, BSN  Trauma/Neuro ICU Case Manager [phone]

## 2018-07-04 NOTE — Progress Notes (Signed)
Assisted tele visit to patient with mom Marlane Hatcher RN

## 2018-07-04 NOTE — Progress Notes (Signed)
  Speech Language Pathology Treatment: Cognitive-Linquistic;Passy Muir Speaking valve  Patient Details Name: Jesse [name] MRN: [id number] DOB: June 04, 1990 Today's Date: 07/04/2018 Time: 5697-9480 SLP Time Calculation (min) (ACUTE ONLY): 30 min  Assessment / Plan / Recommendation Clinical Impression  Jesse [name] was more lethargic with increased secretions (RN deep suctioned prior to session) and difficulty eliciting reflexive cough. PT/OT/ST facilitated sitting on edge of bed and cleaned after stool incontinence.Valve pushed immediately from trach hub due to back pressure and built up to 5-10 seconds. Use of repetitive valve placement in addition to positioning upright assisted secretions to move through trach and eventually achieve strong reflexive cough to clear majority.Pt less engaged today, has never initiated oral mouthing to communicate. HR elevated and SpO2 88-93% with increased work of breathing. Did not follow commands when ST present but delayed response to move RUE with OT. He continues to have periods of strong eye contact and moves eyes to movement in room.    HPI HPI: 28 year old male found down in car with suspected drug overdose versus status epilepticus. Intubated 4/20. 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 No significant medical past history but history of alcohol and drug abuse      SLP Plan  Continue with current plan of care       Recommendations         Patient may use Passy-Muir Speech Valve: with SLP only PMSV Supervision: Full MD: Please consider changing trach tube to : Smaller size;Cuffless         General recommendations: Rehab consult Oral Care Recommendations: Oral care QID Follow up  Recommendations: Inpatient Rehab SLP Visit Diagnosis: Aphonia (R49.1);Cognitive communication deficit (X65.537) Plan: Continue with current plan of care       GO                Royce Macadamia 07/04/2018, 12:08 PM

## 2018-07-04 NOTE — Progress Notes (Signed)
Physical Therapy Treatment Patient Details Name: Jesse [name] MRN: [id number] DOB: [birth year] Today's Date: 07/04/2018    History of Present Illness 28 year old male found down in car with suspected drug overdose versus status epilepticus. Intubated 4/20. MRI showing multiple areas of restricted diffusion, predominantly in the cerebellar hemispheres which might be representative of hypoxic/anoxic injury but specifically in bilateral globus pallidus and lentiform nuclei on the right. No significant medical past history but history of alcohol and drug abuse.     PT Comments    Patient seen in conjunction with SLP and OT. Session limited today due to incontinence of stool. Assisted patient with hygiene and pericare, then attempted transition to EOB. Patient with noted increased WOB and fatigue limiting ability to engage today. Saturations decreasing to upper 80s on ATC 28% fiO2, increased to 60% Fio2 at 10 L to improve saturations. Patient with improved saturations but continued to demonstrate increased secretions and limited activity tolerance. Patient appears in discomfort throughout session. Will continue to see and progress as tolerated.  Follow Up Recommendations  CIR     Equipment Recommendations  (TBD)    Recommendations for Other Services Rehab consult     Precautions / Restrictions Precautions Precautions: Fall Precaution Comments: trach    Mobility  Bed Mobility Overal bed mobility: Needs Assistance Bed Mobility: Rolling;Sidelying to Sit;Sit to Sidelying Rolling: Total assist;+2 for physical assistance Sidelying to sit: Total assist;+2 for physical assistance;+2 for safety/equipment     Sit to sidelying: Total assist;+2 for physical assistance;+2 for safety/equipment General bed mobility comments: Pt unable to assist with bed mobility today   Transfers                 General transfer comment: Did not attempt today   Ambulation/Gait                  Stairs             Wheelchair Mobility    Modified Rankin (Stroke Patients Only)       Balance Overall balance assessment: Needs assistance Sitting-balance support: Feet supported;Bilateral upper extremity supported Sitting balance-Leahy Scale: Zero Sitting balance - Comments: Pt with audible congestion (RN had attempted to suction prior to session).  Upon moving to EOB, HR increased from 111 to 146 and sustained in the 140s, with 02 sat decreasing to 87% on 28% Fi02 via trach collar.  02 sats increased to 60% with sats improving to low 90s and he was returned to supine.  He then coughed and expelled large volume of mucous.  HR decreased to 117, and 02 sats 96% on 28% Fi02        Standing balance comment: did not attempt this date due to increased HR upon sitting                             Cognition Arousal/Alertness: Lethargic Behavior During Therapy: Flat affect Overall Cognitive Status: Impaired/Different from baseline Area of Impairment: Attention;Following commands                   Current Attention Level: Focused   Following Commands: Follows one step commands inconsistently;Follows one step commands with increased time     Problem Solving: Slow processing;Decreased initiation;Requires verbal cues;Requires tactile cues General Comments: Pt tracks therapists around room.  He will follow one step commands with Rt UE only today, but demonstrates at least 15 second delay.  Exercises      General Comments General comments (skin integrity, edema, etc.): see comments under sitting balance       Pertinent Vitals/Pain Pain Assessment: Faces Faces Pain Scale: Hurts even more Pain Location: Pt repeatedly moves Rt hand over abdomen.  When asked if he had pain, he blinked for "yes" response, and "yes" for bellly - unsure of reliability.    Pain Intervention(s): Monitored during session;Repositioned    Home Living                       Prior Function            PT Goals (current goals can now be found in the care plan section) Progress towards PT goals: Not progressing toward goals - comment(limited by incontinence this session)    Frequency    Min 4X/week      PT Plan Current plan remains appropriate    Co-evaluation PT/OT/SLP Co-Evaluation/Treatment: Yes Reason for Co-Treatment: Complexity of the patient's impairments (multi-system involvement);For patient/therapist safety;Necessary to address cognition/behavior during functional activity PT goals addressed during session: Mobility/safety with mobility OT goals addressed during session: Strengthening/ROM      AM-PAC PT "6 Clicks" Mobility   Outcome Measure  Help needed turning from your back to your side while in a flat bed without using bedrails?: Total Help needed moving from lying on your back to sitting on the side of a flat bed without using bedrails?: Total Help needed moving to and from a bed to a chair (including a wheelchair)?: Total Help needed standing up from a chair using your arms (e.g., wheelchair or bedside chair)?: Total Help needed to walk in hospital room?: Total Help needed climbing 3-5 steps with a railing? : Total 6 Click Score: 6    End of Session         PT Visit Diagnosis: Other symptoms and signs involving the nervous system (B86.754)     Time: 4920-1007 PT Time Calculation (min) (ACUTE ONLY): 40 min  Charges:  $Therapeutic Activity: 8-22 mins                     Charlotte Crumb, PT DPT  Board Certified Neurologic Specialist Acute Rehabilitation Services Pager [phone] Office [phone]    Fabio Asa 07/04/2018, 2:11 PM

## 2018-07-05 LAB — URINALYSIS, ROUTINE W REFLEX MICROSCOPIC
Bilirubin Urine: NEGATIVE
Glucose, UA: NEGATIVE mg/dL
Ketones, ur: 5 mg/dL — AB
Leukocytes,Ua: NEGATIVE
Nitrite: NEGATIVE
Protein, ur: 30 mg/dL — AB
Specific Gravity, Urine: 1.032 — ABNORMAL HIGH (ref 1.005–1.030)
pH: 7 (ref 5.0–8.0)

## 2018-07-05 LAB — MRSA PCR SCREENING: MRSA by PCR: NEGATIVE

## 2018-07-05 LAB — EXPECTORATED SPUTUM ASSESSMENT W GRAM STAIN, RFLX TO RESP C: Special Requests: NORMAL

## 2018-07-05 LAB — GLUCOSE, CAPILLARY
Glucose-Capillary: 101 mg/dL — ABNORMAL HIGH (ref 70–99)
Glucose-Capillary: 109 mg/dL — ABNORMAL HIGH (ref 70–99)
Glucose-Capillary: 109 mg/dL — ABNORMAL HIGH (ref 70–99)
Glucose-Capillary: 109 mg/dL — ABNORMAL HIGH (ref 70–99)
Glucose-Capillary: 112 mg/dL — ABNORMAL HIGH (ref 70–99)
Glucose-Capillary: 116 mg/dL — ABNORMAL HIGH (ref 70–99)
Glucose-Capillary: 138 mg/dL — ABNORMAL HIGH (ref 70–99)

## 2018-07-05 LAB — SARS CORONAVIRUS 2 BY RT PCR (HOSPITAL ORDER, PERFORMED IN ~~LOC~~ HOSPITAL LAB): SARS Coronavirus 2: NEGATIVE

## 2018-07-05 MED ORDER — SODIUM CHLORIDE 0.9 % IV SOLN
2.0000 g | Freq: Three times a day (TID) | INTRAVENOUS | Status: DC
  Administered 2018-07-05 – 2018-07-08 (×9): 2 g via INTRAVENOUS
  Filled 2018-07-05 (×11): qty 2

## 2018-07-05 MED ORDER — VANCOMYCIN HCL 10 G IV SOLR
1250.0000 mg | Freq: Three times a day (TID) | INTRAVENOUS | Status: DC
  Administered 2018-07-05 – 2018-07-06 (×2): 1250 mg via INTRAVENOUS
  Filled 2018-07-05 (×4): qty 1250

## 2018-07-05 MED ORDER — VANCOMYCIN HCL 10 G IV SOLR
1500.0000 mg | Freq: Once | INTRAVENOUS | Status: AC
  Administered 2018-07-05: 11:00:00 1500 mg via INTRAVENOUS
  Filled 2018-07-05: qty 1500

## 2018-07-05 MED ORDER — PROPRANOLOL HCL 20 MG/5ML PO SOLN
40.0000 mg | Freq: Every day | ORAL | Status: DC
  Administered 2018-07-05 – 2018-07-13 (×8): 40 mg
  Filled 2018-07-05 (×10): qty 10

## 2018-07-05 MED ORDER — SODIUM CHLORIDE 0.9 % IV SOLN
INTRAVENOUS | Status: AC
  Administered 2018-07-05: 15:00:00 via INTRAVENOUS

## 2018-07-05 NOTE — Progress Notes (Signed)
Assisted tele visit to patient with mother.  Ann Lions, RN

## 2018-07-05 NOTE — Progress Notes (Signed)
Pharmacy Antibiotic Note  Jesse [name] is a 28 y.o. male admitted on 06/13/2018 with sepsis.  Pharmacy has been consulted for vancomycin dosing.  Patient is now s/p 8 days of meropenem for ESBL Klebsiella and MSSA pneumonia. Patient with recurrent fevers on meropenem; tmax 101.4, 5/11 CXR with "Small area of apparent pneumonia in the inferior aspect medial right base"  Plan: Continue Meropenem 2g q8h >> Vancomycin loading dose 1500mg  x1 Vancomycin 1250mg  q8h >> -Estimated AUC 510 Pharmacy will monitor levels, culture data, renal function, LOT  Height: 6' (182.9 cm) Weight: 170 lb 3.1 oz (77.2 kg) IBW/kg (Calculated) : 77.6  Temp (24hrs), Avg:100.3 F (37.9 C), Min:99.3 F (37.4 C), Max:101.4 F (38.6 C)  Recent Labs  Lab 07/02/18 0332  CREATININE 0.68    Estimated Creatinine Clearance: 154.1 mL/min (by C-G formula based on SCr of 0.68 mg/dL).    Allergies  Allergen Reactions  . Other Other (See Comments)    Seasonal allergies- Stuffy nose, itchy eyes, congestion, etc..    Antimicrobials this admission: Unasyn 4/21>>4/27 Cefepime 5/2>>5/4 Merrem 5/4 >> 5/12; 5/12>> Vanc 5/12 >>  4/30 Trach - few MSSA, ESBL Klebsiella (S merrem) 4/25 Trach - NRF 4/20 COVID - negative 4/20 MRSA PCR negative 4/20 blood x 2 - ngtd 4/20 urine - neg 4/20 HIV non-reactive  Thank you for allowing pharmacy to be a part of this patient's care.  Wendelyn Breslow, PharmD PGY1 Pharmacy Resident Phone: [phone] 07/05/2018 11:03 AM

## 2018-07-05 NOTE — Progress Notes (Signed)
Patients mother called and wanted an update with the doctor. Mothers name is Larita Fife [phone]

## 2018-07-05 NOTE — Progress Notes (Addendum)
PROGRESS NOTE    Jesse [name]  WUJ:[id number] DOB: [birth year] DOA: 06/13/2018 PCP: No primary care provider on file.    Brief Narrative:  28 year old male who was found down in his vehicle, unresponsive, suspected postictal. He had several unmarked pills around him. Patient did not respond to naloxone. While at the emergency department he required intubation for airway protection. Upon admission to the intensive care unit he was hypotensive, required vasopressors.  He had a prolonged hospital stay, he was diagnosed with staph aureus and Klebsiella pneumonia. He continued to be encephalopathic, possible anoxic injury. He required tracheostomy April 30, in orderto be liberated from mechanical ventilation.  Assessment & Plan:   Active Problems:   Altered mental status   Acute respiratory failure with hypoxia (HCC)   Status post tracheostomy (HCC)   Drug overdose   Shock (HCC)   Goals of care, counseling/discussion   Palliative care by specialist   AKI (acute kidney injury) (HCC)   Tachypnea   Acute blood loss anemia   Drug addiction (HCC)   Anoxic brain injury (HCC)   Palliative care encounter   1. Acute hypoxic and hypercapnic respiratory failure due to encephalopathy, possible hypoxic brain injury.Tolerating well trach collar, managing secretions well. Has tube for feedings per NG. Continue Keppra for seizure prophylaxis. Remains non-verbal on my exam today. Family initially hoped for transfer to [facility] facility. Facility has decided to NOT accept pt in transfer. Follow up on SW for possible SNF vs CIR. Not medically ready at this time  2. Pneumonia (not present on admission)/ Klebsiella ESBL Continued with meropenem for antibiotic therapy,  Continue oxymetry monitoring and aspiration precautions. Now off steroids. Remains on minimal O2 support. Continue suctioning.stable at present on RA.  -increased mucus production noted. Attempting chest PT -More tachycardic  and now febrile. See below. Have broadened coverage pending results of repeat cultures including blood, urine, and sputum  3. Urinary retention. Patient is continued with foley cath. Follow up on above cultures  4. Sepsis, unclear source. Patient noted to be febrile with tachycardia. Recent CXR reviewed, possible area of new PNA. Blood and urine cx ordered and pending. Have resumed broad-spectrum abx (meropenem and vanc) for now. Continue on basal IVF. Would follow cultures and narrow abx coverage as tolerated  DVT prophylaxis: Lovenox subQ Code Status: Full Family Communication: Pt in room, family not at bedside, updated family over phone Disposition Plan: Uncertain at this time  Consultants:   Critical Care  Procedures:     Antimicrobials: Anti-infectives (From admission, onward)   Start     Dose/Rate Route Frequency Ordered Stop   07/05/18 2000  vancomycin (VANCOCIN) 1,250 mg in sodium chloride 0.9 % 250 mL IVPB     1,250 mg 166.7 mL/hr over 90 Minutes Intravenous Every 8 hours 07/05/18 1107     07/05/18 1700  meropenem (MERREM) 2 g in sodium chloride 0.9 % 100 mL IVPB     2 g 200 mL/hr over 30 Minutes Intravenous Every 8 hours 07/05/18 1049 07/13/18 1759   07/05/18 1015  vancomycin (VANCOCIN) 1,500 mg in sodium chloride 0.9 % 500 mL IVPB     1,500 mg 250 mL/hr over 120 Minutes Intravenous  Once 07/05/18 1009 07/05/18 1315   06/27/18 1000  meropenem (MERREM) 2 g in sodium chloride 0.9 % 100 mL IVPB     2 g 200 mL/hr over 30 Minutes Intravenous Every 8 hours 06/27/18 0950 07/05/18 0938   06/25/18 0900  ceFEPIme (MAXIPIME) 2 g in sodium  chloride 0.9 % 100 mL IVPB  Status:  Discontinued     2 g 200 mL/hr over 30 Minutes Intravenous Every 8 hours 06/25/18 0856 06/27/18 0936   06/14/18 1100  Ampicillin-Sulbactam (UNASYN) 3 g in sodium chloride 0.9 % 100 mL IVPB     3 g 200 mL/hr over 30 Minutes Intravenous Every 6 hours 06/14/18 1015 06/20/18 1858      Subjective:  Non-verbal  Objective: Vitals:   07/05/18 0806 07/05/18 0843 07/05/18 1321 07/05/18 1453  BP: 113/78  113/76   Pulse: (!) 108 (!) 118 (!) 112 (!) 113  Resp: (!) 23 16 20 16   Temp: (!) 101.4 F (38.6 C)  (!) 101.7 F (38.7 C)   TempSrc: Oral     SpO2: 96% 95% 96% 95%  Weight:      Height:        Intake/Output Summary (Last 24 hours) at 07/05/2018 1635 Last data filed at 07/05/2018 1300 Gross per 24 hour  Intake 455 ml  Output 1875 ml  Net -1420 ml   Filed Weights   07/03/18 0500 07/04/18 0400 07/05/18 0500  Weight: 72.4 kg 72.3 kg 77.2 kg    Examination: General exam: Not conversant, in no acute distress Respiratory system: normal chest rise, clear, no audible wheezing Cardiovascular system: regular rhythm, s1-s2 Gastrointestinal system: Nondistended, nontender, pos BS Central nervous system: No seizures, no tremors Extremities: No cyanosis, no joint deformities Skin: No rashes, no pallor Psychiatry: Unable to assess given non-verbal state  Data Reviewed: I have personally reviewed following labs and imaging studies  CBC: No results for input(s): WBC, NEUTROABS, HGB, HCT, MCV, PLT in the last 168 hours. Basic Metabolic Panel: Recent Labs  Lab 07/02/18 0332  NA 141  K 4.1  CL 100  CO2 27  GLUCOSE 107*  BUN 27*  CREATININE 0.68  CALCIUM 9.9   GFR: Estimated Creatinine Clearance: 154.1 mL/min (by C-G formula based on SCr of 0.68 mg/dL). Liver Function Tests: No results for input(s): AST, ALT, ALKPHOS, BILITOT, PROT, ALBUMIN in the last 168 hours. No results for input(s): LIPASE, AMYLASE in the last 168 hours. No results for input(s): AMMONIA in the last 168 hours. Coagulation Profile: No results for input(s): INR, PROTIME in the last 168 hours. Cardiac Enzymes: No results for input(s): CKTOTAL, CKMB, CKMBINDEX, TROPONINI in the last 168 hours. BNP (last 3 results) No results for input(s): PROBNP in the last 8760 hours. HbA1C: No results for input(s):  HGBA1C in the last 72 hours. CBG: Recent Labs  Lab 07/04/18 2108 07/04/18 2358 07/05/18 0355 07/05/18 0804 07/05/18 1315  GLUCAP 127* 101* 112* 116* 109*   Lipid Profile: No results for input(s): CHOL, HDL, LDLCALC, TRIG, CHOLHDL, LDLDIRECT in the last 72 hours. Thyroid Function Tests: No results for input(s): TSH, T4TOTAL, FREET4, T3FREE, THYROIDAB in the last 72 hours. Anemia Panel: No results for input(s): VITAMINB12, FOLATE, FERRITIN, TIBC, IRON, RETICCTPCT in the last 72 hours. Sepsis Labs: No results for input(s): PROCALCITON, LATICACIDVEN in the last 168 hours.  Recent Results (from the past 240 hour(s))  Expectorated sputum assessment w rflx to resp cult     Status: None   Collection Time: 07/05/18  9:41 AM  Result Value Ref Range Status   Specimen Description EXPECTORATED SPUTUM  Final   Special Requests Normal  Final   Sputum evaluation   Final    THIS SPECIMEN IS ACCEPTABLE FOR SPUTUM CULTURE Performed at [facility], [address] [place], [place] [address]  Report Status 07/05/2018 FINAL  Final  Culture, respiratory     Status: None (Preliminary result)   Collection Time: 07/05/18  9:41 AM  Result Value Ref Range Status   Specimen Description EXPECTORATED SPUTUM  Final   Special Requests Normal Reflexed from [id number]  Final   Gram Stain   Final    MODERATE WBC PRESENT, PREDOMINANTLY PMN RARE SQUAMOUS EPITHELIAL CELLS PRESENT NO ORGANISMS SEEN Performed at [facility], [address] [place], [place] [address]    Culture PENDING  Incomplete   Report Status PENDING  Incomplete  SARS Coronavirus 2 (CEPHEID - Performed in [facility] hospital lab), Hosp Order     Status: None   Collection Time: 07/05/18 10:55 AM  Result Value Ref Range Status   SARS Coronavirus 2 NEGATIVE NEGATIVE Final    Comment: (NOTE) If result is NEGATIVE SARS-CoV-2 target nucleic acids are NOT DETECTED. The SARS-CoV-2 RNA is generally detectable in upper and lower   respiratory specimens during the acute phase of infection. The lowest  concentration of SARS-CoV-2 viral copies this assay can detect is 250  copies / mL. A negative result does not preclude SARS-CoV-2 infection  and should not be used as the sole basis for treatment or other  patient management decisions.  A negative result may occur with  improper specimen collection / handling, submission of specimen other  than nasopharyngeal swab, presence of viral mutation(s) within the  areas targeted by this assay, and inadequate number of viral copies  (<250 copies / mL). A negative result must be combined with clinical  observations, patient history, and epidemiological information. If result is POSITIVE SARS-CoV-2 target nucleic acids are DETECTED. The SARS-CoV-2 RNA is generally detectable in upper and lower  respiratory specimens dur ing the acute phase of infection.  Positive  results are indicative of active infection with SARS-CoV-2.  Clinical  correlation with patient history and other diagnostic information is  necessary to determine patient infection status.  Positive results do  not rule out bacterial infection or co-infection with other viruses. If result is PRESUMPTIVE POSTIVE SARS-CoV-2 nucleic acids MAY BE PRESENT.   A presumptive positive result was obtained on the submitted specimen  and confirmed on repeat testing.  While 2019 novel coronavirus  (SARS-CoV-2) nucleic acids may be present in the submitted sample  additional confirmatory testing may be necessary for epidemiological  and / or clinical management purposes  to differentiate between  SARS-CoV-2 and other Sarbecovirus currently known to infect humans.  If clinically indicated additional testing with an alternate test  methodology [phone]) is advised. The SARS-CoV-2 RNA is generally  detectable in upper and lower respiratory sp ecimens during the acute  phase of infection. The expected result is Negative. Fact  Sheet for Patients:  BoilerBrush.com.cy Fact Sheet for Healthcare Providers: https://pope.com/ This test is not yet approved or cleared by the [place] FDA and has been authorized for detection and/or diagnosis of SARS-CoV-2 by FDA under an Emergency Use Authorization (EUA).  This EUA will remain in effect (meaning this test can be used) for the duration of the COVID-19 declaration under Section 564(b)(1) of the Act, 21 U.S.C. section 360bbb-3(b)(1), unless the authorization is terminated or revoked sooner. Performed at [facility], [address] [place], [place] [address]   MRSA PCR Screening     Status: None   Collection Time: 07/05/18  1:00 PM  Result Value Ref Range Status   MRSA by PCR NEGATIVE NEGATIVE Final  Comment:        The GeneXpert MRSA Assay (FDA approved for NASAL specimens only), is one component of a comprehensive MRSA colonization surveillance program. It is not intended to diagnose MRSA infection nor to guide or monitor treatment for MRSA infections. Performed at [facility], [address], [place], [place] [address]      Radiology Studies: Dg Chest Port 1 View  Result Date: 07/04/2018 CLINICAL DATA:  Difficulty breathing the EXAM: PORTABLE CHEST 1 VIEW COMPARISON:  Jun 25, 2018 FINDINGS: Tracheostomy catheter tip is 3.8 cm above the carina. Feeding tube tip is in the distal stomach. No pneumothorax. There is a focal area of airspace consolidation in the medial right base inferiorly. Lungs elsewhere are clear. Heart size and pulmonary vascularity are normal. No adenopathy. No bone lesions. IMPRESSION: Tube and catheter positions as described without pneumothorax. Small area of apparent pneumonia in the inferior aspect medial right base. Lungs elsewhere are clear. Heart size normal. No evident adenopathy. Electronically Signed   By: Bretta Bang III M.D.   On: 07/04/2018 14:28     Scheduled Meds: . bethanechol  10 mg Per Tube TID  . chlorhexidine  15 mL Mouth Rinse BID  . enoxaparin (LOVENOX) injection  40 mg Subcutaneous Q24H  . feeding supplement (PRO-STAT SUGAR FREE 64)  30 mL Per Tube Daily  . ipratropium-albuterol  3 mL Nebulization TID  . levETIRAcetam  500 mg Per Tube BID  . mouth rinse  15 mL Mouth Rinse 10 times per day  . propranolol  40 mg Per Tube Q2000   Continuous Infusions: . sodium chloride 250 mL (07/04/18 0138)  . sodium chloride 100 mL/hr at 07/05/18 1430  . feeding supplement (OSMOLITE 1.5 CAL) 65 mL/hr at 07/03/18 2357  . meropenem (MERREM) IV    . vancomycin       LOS: 22 days   Rickey Barbara, MD [facility] Pager On Amion  If 7PM-7AM, please contact night-coverage 07/05/2018, 4:35 PM

## 2018-07-06 LAB — BASIC METABOLIC PANEL
Anion gap: 8 (ref 5–15)
BUN: 31 mg/dL — ABNORMAL HIGH (ref 6–20)
CO2: 26 mmol/L (ref 22–32)
Calcium: 9.3 mg/dL (ref 8.9–10.3)
Chloride: 111 mmol/L (ref 98–111)
Creatinine, Ser: 0.7 mg/dL (ref 0.61–1.24)
GFR calc Af Amer: >60 mL/min (ref >60)
GFR calc non Af Amer: >60 mL/min (ref >60)
Glucose, Bld: 125 mg/dL — ABNORMAL HIGH (ref 70–99)
Potassium: 3.9 mmol/L (ref 3.5–5.1)
Sodium: 145 mmol/L (ref 135–145)

## 2018-07-06 LAB — CBC
HCT: 44.1 % (ref 39.0–52.0)
Hemoglobin: 14.4 g/dL (ref 13.0–17.0)
MCH: 30.4 pg (ref 26.0–34.0)
MCHC: 32.7 g/dL (ref 30.0–36.0)
MCV: 93 fL (ref 80.0–100.0)
Platelets: 345 10*3/uL (ref 150–400)
RBC: 4.74 MIL/uL (ref 4.22–5.81)
RDW: 12.1 % (ref 11.5–15.5)
WBC: 10.3 10*3/uL (ref 4.0–10.5)
nRBC: 0 % (ref 0.0–0.2)

## 2018-07-06 LAB — URINE CULTURE
Culture: NO GROWTH
Special Requests: NORMAL

## 2018-07-06 LAB — GLUCOSE, CAPILLARY
Glucose-Capillary: 103 mg/dL — ABNORMAL HIGH (ref 70–99)
Glucose-Capillary: 108 mg/dL — ABNORMAL HIGH (ref 70–99)
Glucose-Capillary: 120 mg/dL — ABNORMAL HIGH (ref 70–99)
Glucose-Capillary: 124 mg/dL — ABNORMAL HIGH (ref 70–99)
Glucose-Capillary: 137 mg/dL — ABNORMAL HIGH (ref 70–99)
Glucose-Capillary: 93 mg/dL (ref 70–99)

## 2018-07-06 NOTE — Progress Notes (Signed)
Assisted tele visit to patient with fiance.  Yehuda Printup, Dixon Boos, RN

## 2018-07-06 NOTE — Progress Notes (Signed)
Inpatient Rehabilitation Admissions Coordinator  I continue to follow pt's progress at a distance. Not yet at a level to consider admission to [facility] inpt rehab . Mom also prefers [facility] inpt rehab at this time.  Ottie Glazier, RN, MSN Rehab Admissions Coordinator [phone] 07/06/2018 1:22 PM

## 2018-07-06 NOTE — Progress Notes (Signed)
PROGRESS NOTE  Jesse [name] JJH:[id number] DOB: 17-Jul-1990 DOA: 06/13/2018 PCP: No primary care provider on file.  HPI/Recap of past 17 hours: 28 year old male who was found down in his vehicle, unresponsive, suspected postictal. He had several unmarked pills around him. Patient did not respond to naloxone. While at the emergency department he required intubation for airway protection. Upon admission to the intensive care unit he was hypotensive, required vasopressors.  He had a prolonged hospital stay, he was diagnosed with staph aureus and Klebsiella pneumonia. He continued to be encephalopathic, possible anoxic injury. He required tracheostomy April 30, in orderto be liberated from mechanical ventilation.  07/06/18: Patient seen and examined at his bedside.  He is non responsive. Does not follow commands. Tmax 100.2 overnight. O2 sat 96% on trach collar.   Assessment/Plan: Active Problems:   Altered mental status   Acute respiratory failure with hypoxia (HCC)   Status post tracheostomy (HCC)   Drug overdose   Shock (HCC)   Goals of care, counseling/discussion   Palliative care by specialist   AKI (acute kidney injury) (HCC)   Tachypnea   Acute blood loss anemia   Drug addiction (HCC)   Anoxic brain injury (HCC)   Palliative care encounter  1. Acute hypoxic and hypercapnic respiratory failure due to encephalopathy, possible hypoxic brain injury.Tolerating welltrach collar, managing secretions well. Hastube for feedingsper NG.Continue Keppra for seizure prophylaxis. Remains non-verbal on my exam today. Family initially hoped for transfer to [facility] facility. Facility has decided to NOT accept pt in transfer. Follow up on SW for possible SNF vs CIR. Not medically ready at this time.  2.  Sepsis secondary to Klebsiella ESBL HCAP  Leukocytosis improving from 25K to 10 K this morning Tachycardia and low-grade fever persistent  Continued withmeropenem for  antibiotic therapy,Continue oxymetry monitoring and aspiration precautions. Now off steroids. Remains on minimal O2 support. Continue suctioning.stable at present on RA.  -increased mucus production noted. Attempting chest PT -Persistent tachycardia -T-max 100.2 overnight -Repeated blood cultures x2 done on 07/05/2018 in process  3.  Resolved urinary retention. Condom cath in place  4.  Polysubstance abuse including cocaine UDS done on 06/13/2018+ for cocaine and benzodiazepine  DVT prophylaxis: Lovenox subQ Code Status: Full Family Communication:  Will update family over the phone Disposition Plan: Uncertain at this time    Objective: Vitals:   07/06/18 0500 07/06/18 0600 07/06/18 0800 07/06/18 0807  BP:   136/76   Pulse: (!) 104 (!) 103 (!) 114 (!) 118  Resp: 15 18 15 16   Temp:   99.6 F (37.6 C)   TempSrc:   Oral   SpO2: 96% 96% 92%   Weight:      Height:        Intake/Output Summary (Last 24 hours) at 07/06/2018 1117 Last data filed at 07/06/2018 0600 Gross per 24 hour  Intake 2114.89 ml  Output 2375 ml  Net -260.11 ml   Filed Weights   07/03/18 0500 07/04/18 0400 07/05/18 0500  Weight: 72.4 kg 72.3 kg 77.2 kg    Exam:  . General: 28 y.o. year-old male well developed well nourished in no acute distress.  Somnolent and nonresponsive.  Does not follow commands. . Cardiovascular: Regular rate and rhythm with no rubs or gallops.  No thyromegaly or JVD noted.   Marland Kitchen Respiratory: Clear to auscultation with no wheezes or rales.  Poor inspiratory effort. . Abdomen: Soft nontender nondistended with normal bowel sounds x4 quadrants. . Musculoskeletal: Trace lower extremity edema. 2/4  pulses in all 4 extremities. Marland Kitchen Psychiatry: Unable to assess mood due to somnolence and altered mental status  Data Reviewed: CBC: Recent Labs  Lab 07/06/18 0159  WBC 10.3  HGB 14.4  HCT 44.1  MCV 93.0  PLT 345   Basic Metabolic Panel: Recent Labs  Lab 07/02/18 0332 07/06/18  0159  NA 141 145  K 4.1 3.9  CL 100 111  CO2 27 26  GLUCOSE 107* 125*  BUN 27* 31*  CREATININE 0.68 0.70  CALCIUM 9.9 9.3   GFR: Estimated Creatinine Clearance: 154.1 mL/min (by C-G formula based on SCr of 0.7 mg/dL). Liver Function Tests: No results for input(s): AST, ALT, ALKPHOS, BILITOT, PROT, ALBUMIN in the last 168 hours. No results for input(s): LIPASE, AMYLASE in the last 168 hours. No results for input(s): AMMONIA in the last 168 hours. Coagulation Profile: No results for input(s): INR, PROTIME in the last 168 hours. Cardiac Enzymes: No results for input(s): CKTOTAL, CKMB, CKMBINDEX, TROPONINI in the last 168 hours. BNP (last 3 results) No results for input(s): PROBNP in the last 8760 hours. HbA1C: No results for input(s): HGBA1C in the last 72 hours. CBG: Recent Labs  Lab 07/05/18 1536 07/05/18 2000 07/05/18 2351 07/06/18 0403 07/06/18 0828  GLUCAP 138* 109* 109* 120* 137*   Lipid Profile: No results for input(s): CHOL, HDL, LDLCALC, TRIG, CHOLHDL, LDLDIRECT in the last 72 hours. Thyroid Function Tests: No results for input(s): TSH, T4TOTAL, FREET4, T3FREE, THYROIDAB in the last 72 hours. Anemia Panel: No results for input(s): VITAMINB12, FOLATE, FERRITIN, TIBC, IRON, RETICCTPCT in the last 72 hours. Urine analysis:    Component Value Date/Time   COLORURINE YELLOW 07/05/2018 1200   APPEARANCEUR HAZY (A) 07/05/2018 1200   LABSPEC 1.032 (H) 07/05/2018 1200   PHURINE 7.0 07/05/2018 1200   GLUCOSEU NEGATIVE 07/05/2018 1200   HGBUR MODERATE (A) 07/05/2018 1200   BILIRUBINUR NEGATIVE 07/05/2018 1200   KETONESUR 5 (A) 07/05/2018 1200   PROTEINUR 30 (A) 07/05/2018 1200   NITRITE NEGATIVE 07/05/2018 1200   LEUKOCYTESUR NEGATIVE 07/05/2018 1200   Sepsis Labs: (procalcitonin:4,lacticidven:4)  ) Recent Results (from the past 240 hour(s))  Culture, Urine     Status: None   Collection Time: 07/05/18  9:41 AM  Result Value Ref Range Status    Specimen Description URINE, RANDOM  Final   Special Requests Normal  Final   Culture   Final    NO GROWTH Performed at [facility], [address] [place], [place] [address]    Report Status 07/06/2018 FINAL  Final  Expectorated sputum assessment w rflx to resp cult     Status: None   Collection Time: 07/05/18  9:41 AM  Result Value Ref Range Status   Specimen Description EXPECTORATED SPUTUM  Final   Special Requests Normal  Final   Sputum evaluation   Final    THIS SPECIMEN IS ACCEPTABLE FOR SPUTUM CULTURE Performed at [facility], [address] [place], [place] [address]    Report Status 07/05/2018 FINAL  Final  Culture, respiratory     Status: None (Preliminary result)   Collection Time: 07/05/18  9:41 AM  Result Value Ref Range Status   Specimen Description EXPECTORATED SPUTUM  Final   Special Requests Normal Reflexed from [id number]  Final   Gram Stain   Final    MODERATE WBC PRESENT, PREDOMINANTLY PMN RARE SQUAMOUS EPITHELIAL CELLS PRESENT NO ORGANISMS SEEN    Culture   Final    FEW YEAST IDENTIFICATION  TO FOLLOW Performed at [facility], [address] [place], [place] [address]    Report Status PENDING  Incomplete  SARS Coronavirus 2 (CEPHEID - Performed in [facility] hospital lab), Hosp Order     Status: None   Collection Time: 07/05/18 10:55 AM  Result Value Ref Range Status   SARS Coronavirus 2 NEGATIVE NEGATIVE Final    Comment: (NOTE) If result is NEGATIVE SARS-CoV-2 target nucleic acids are NOT DETECTED. The SARS-CoV-2 RNA is generally detectable in upper and lower  respiratory specimens during the acute phase of infection. The lowest  concentration of SARS-CoV-2 viral copies this assay can detect is 250  copies / mL. A negative result does not preclude SARS-CoV-2 infection  and should not be used as the sole basis for treatment or other  patient management decisions.  A negative result may occur with  improper specimen collection /  handling, submission of specimen other  than nasopharyngeal swab, presence of viral mutation(s) within the  areas targeted by this assay, and inadequate number of viral copies  (<250 copies / mL). A negative result must be combined with clinical  observations, patient history, and epidemiological information. If result is POSITIVE SARS-CoV-2 target nucleic acids are DETECTED. The SARS-CoV-2 RNA is generally detectable in upper and lower  respiratory specimens dur ing the acute phase of infection.  Positive  results are indicative of active infection with SARS-CoV-2.  Clinical  correlation with patient history and other diagnostic information is  necessary to determine patient infection status.  Positive results do  not rule out bacterial infection or co-infection with other viruses. If result is PRESUMPTIVE POSTIVE SARS-CoV-2 nucleic acids MAY BE PRESENT.   A presumptive positive result was obtained on the submitted specimen  and confirmed on repeat testing.  While 2019 novel coronavirus  (SARS-CoV-2) nucleic acids may be present in the submitted sample  additional confirmatory testing may be necessary for epidemiological  and / or clinical management purposes  to differentiate between  SARS-CoV-2 and other Sarbecovirus currently known to infect humans.  If clinically indicated additional testing with an alternate test  methodology [phone]) is advised. The SARS-CoV-2 RNA is generally  detectable in upper and lower respiratory sp ecimens during the acute  phase of infection. The expected result is Negative. Fact Sheet for Patients:  BoilerBrush.com.cyhttps://www.fda.gov/media/136312/download Fact Sheet for Healthcare Providers: https://pope.com/https://www.fda.gov/media/136313/download This test is not yet approved or cleared by the [place] FDA and has been authorized for detection and/or diagnosis of SARS-CoV-2 by FDA under an Emergency Use Authorization (EUA).  This EUA will remain in effect (meaning this  test can be used) for the duration of the COVID-19 declaration under Section 564(b)(1) of the Act, 21 U.S.C. section 360bbb-3(b)(1), unless the authorization is terminated or revoked sooner. Performed at [facility], [address] [place], [place] [address]   MRSA PCR Screening     Status: None   Collection Time: 07/05/18  1:00 PM  Result Value Ref Range Status   MRSA by PCR NEGATIVE NEGATIVE Final    Comment:        The GeneXpert MRSA Assay (FDA approved for NASAL specimens only), is one component of a comprehensive MRSA colonization surveillance program. It is not intended to diagnose MRSA infection nor to guide or monitor treatment for MRSA infections. Performed at [facility], [address] [place], [place] [address]       Studies: No results found.  Scheduled Meds: . bethanechol  10  mg Per Tube TID  . chlorhexidine  15 mL Mouth Rinse BID  . enoxaparin (LOVENOX) injection  40 mg Subcutaneous Q24H  . feeding supplement (PRO-STAT SUGAR FREE 64)  30 mL Per Tube Daily  . ipratropium-albuterol  3 mL Nebulization TID  . levETIRAcetam  500 mg Per Tube BID  . mouth rinse  15 mL Mouth Rinse 10 times per day  . propranolol  40 mg Per Tube Q2000    Continuous Infusions: . sodium chloride 250 mL (07/06/18 1041)  . feeding supplement (OSMOLITE 1.5 CAL) 65 mL/hr at 07/03/18 2357  . meropenem (MERREM) IV 2 g (07/06/18 1044)  . vancomycin 1,250 mg (07/06/18 0422)     LOS: 23 days     Darlin Drop, MD [facility] Pager [phone]  If 7PM-7AM, please contact night-coverage [email or web address] Password [facility] 07/06/2018, 11:17 AM

## 2018-07-06 NOTE — Progress Notes (Signed)
Physical Therapy Treatment Patient Details Name: Jesse [name] MRN: [id number] DOB: [birth year] Today's Date: 07/06/2018    History of Present Illness 28 year old male found down in car with suspected drug overdose versus status epilepticus. Intubated 4/20. MRI showing multiple areas of restricted diffusion, predominantly in the cerebellar hemispheres which might be representative of hypoxic/anoxic injury but specifically in bilateral globus pallidus and lentiform nuclei on the right. No significant medical past history but history of alcohol and drug abuse.     PT Comments    Pt visually tracking therapists consistently this session and following two commands for looking left and right. Bed placed in chair position to promote upright. Session focused on BUE/BLE PROM and trunk control in unsupported sitting with perturbations. Did not note R shoulder initiation today. Played "Green Day," music for attempts to engage patient.    Follow Up Recommendations  CIR     Equipment Recommendations  (TBD)    Recommendations for Other Services Rehab consult     Precautions / Restrictions Precautions Precautions: Fall Precaution Comments: trach Restrictions Weight Bearing Restrictions: No    Mobility  Bed Mobility Overal bed mobility: Needs Assistance             General bed mobility comments: TotalA + 2 to progress sitting unsupported with bed in chair position  Transfers                 General transfer comment: Did not attempt today   Ambulation/Gait                 Stairs             Wheelchair Mobility    Modified Rankin (Stroke Patients Only)       Balance Overall balance assessment: Needs assistance Sitting-balance support: Feet supported;Bilateral upper extremity supported Sitting balance-Leahy Scale: Zero                                      Cognition Arousal/Alertness: Awake/alert Behavior During Therapy: Flat  affect Overall Cognitive Status: Impaired/Different from baseline Area of Impairment: Attention;Following commands                   Current Attention Level: Focused   Following Commands: Follows one step commands inconsistently;Follows one step commands with increased time     Problem Solving: Slow processing;Decreased initiation;Requires verbal cues;Requires tactile cues General Comments: Pt tracks therapists around room. Followed 2 simple commands during session (looking to right and left)      Exercises General Exercises - Upper Extremity Shoulder Flexion: 5 reps;Both;Supine;PROM Elbow Flexion: PROM;5 reps;Supine Elbow Extension: Both;5 reps;Supine;PROM General Exercises - Lower Extremity Ankle Circles/Pumps: PROM;Left;Right;5 reps;Supine Short Arc Quad: PROM;Both;5 reps;Supine Hip Flexion/Marching: PROM;Right;Left;5 reps;Supine Other Exercises Other Exercises: Supine: PROM BUE D1 pattern    General Comments        Pertinent Vitals/Pain Pain Assessment: Faces Faces Pain Scale: No hurt    Home Living                      Prior Function            PT Goals (current goals can now be found in the care plan section)      Frequency    Min 4X/week      PT Plan Current plan remains appropriate    Co-evaluation  AM-PAC PT "6 Clicks" Mobility   Outcome Measure  Help needed turning from your back to your side while in a flat bed without using bedrails?: Total Help needed moving from lying on your back to sitting on the side of a flat bed without using bedrails?: Total Help needed moving to and from a bed to a chair (including a wheelchair)?: Total Help needed standing up from a chair using your arms (e.g., wheelchair or bedside chair)?: Total Help needed to walk in hospital room?: Total Help needed climbing 3-5 steps with a railing? : Total 6 Click Score: 6    End of Session Equipment Utilized During Treatment:  Oxygen Activity Tolerance: Patient tolerated treatment well Patient left: in bed;with call bell/phone within reach;with SCD's reapplied(placed in chair position)   PT Visit Diagnosis: Other symptoms and signs involving the nervous system (U04.540(R29.898)     Time: [id number] PT Time Calculation (min) (ACUTE ONLY): 29 min  Charges:  $Therapeutic Exercise: 23-37 mins                    Laurina Bustlearoline Tammie Yanda, PT, DPT Acute Rehabilitation Services Pager [phone] Office [phone]   Vanetta MuldersCarloine H Elridge Stemm 07/06/2018, 5:06 PM

## 2018-07-06 NOTE — Progress Notes (Signed)
Assisted tele visit to patient with father and girlfriend.  Jesse [name], Dixon Boos, RN

## 2018-07-06 NOTE — Care Management (Signed)
Spoke with [facility] at [facility] (Phone: [phone]):  Case still in review with medical director for decision on admission.  She states pt appears to be a good candidate for facility's low level or "neuro" rehab, but currently no beds available, and none anticipated for a week or more.  Admission dependent on parent's ability to provide support at discharge, and obtaining insurance authorization.    I spoke with pt's mother regarding update on rehab evaluation.  She states that she is not sure about care after rehab, as she "does not know what he will need."  She state that family will likely have to pay for hired caregivers to assist with care.  We discussed possible options for disposition should pt not get accepted to [facility], including other rehab facilities and skilled nursing facilities.  Mother insistent on brain injury rehab for pt, and states will not consider SNF.    I have updated attending MD on rehab referral.  Mother has left disability/insurance forms in chart for attending MD to sign.  Pt will also likely need PEG tube prior to dc to any rehab facility.  MD states she will discuss with mother.  Quintella Baton, RN, BSN  Trauma/Neuro ICU Case Manager [phone]

## 2018-07-06 NOTE — Progress Notes (Signed)
Nutrition Follow-up   RD working remotely.  DOCUMENTATION CODES:   Not applicable  INTERVENTION:  Continue Osmolite 1.5 formula via Cortrak NGT at goal rate of 65 ml/hr.   Continue 30 ml Prostat once daily.    Tube feeding regimen provides 2440 kcal (100% of needs), 113 grams of protein, and 1186 ml of H2O.   NUTRITION DIAGNOSIS:   Inadequate oral intake related to inability to eat as evidenced by NPO status; ongoing  GOAL:   Patient will meet greater than or equal to 90% of their needs; met via TF  MONITOR:   Labs, Weight trends, I & O's, Skin, TF tolerance  REASON FOR ASSESSMENT:   Consult, Ventilator Enteral/tube feeding initiation and management  ASSESSMENT:   Pt with PMH of substance abuse and prior suicide attempt admitted 4/20 with suspected drug overdose (UDS positive on admission for amphetamines, cocaine, and benzos), suspected RLL aspiration PNA, and AKI after being found down in his car outside of a hotel.   Tracheostomy placed 4/30. Pt extubated to ATC 5/3. Cortrak NGT placed 5/1. Tip of tube in stomach. Pt remains NPO. Pt encephalopathic with possible anoxic brain injury. Pt nonverbal. Pt has been tolerating his tube feeding using new formula of Osmolite 1.5 formula. Noted pt with sepsis secondary to Klebsiella ESBL HCAP not present on admission. Per MD, pt with increased mucus production. RD to continue with current tube feeding regimen. Will transition to bolus once mucus production and secretions improve to ensure bolus tolerance. Labs and medications reviewed.   Diet Order:   Diet Order    None      EDUCATION NEEDS:   No education needs have been identified at this time  Skin:  Skin Assessment: Reviewed RN Assessment  Last BM:  5/12  Height:   Ht Readings from Last 1 Encounters:  06/13/18 6' (1.829 m)    Weight:   Wt Readings from Last 1 Encounters:  07/05/18 77.2 kg    Ideal Body Weight:  80.9 kg  BMI:  Body mass index is 23.08  kg/m.  Estimated Nutritional Needs:   Kcal:  2400-2600  Protein:  110-120 grams  Fluid:  > 2 L/day    Corrin Parker, MS, RD, LDN Pager # [phone] After hours/ weekend pager # [phone]

## 2018-07-07 ENCOUNTER — Inpatient Hospital Stay (HOSPITAL_COMMUNITY): Payer: BLUE CROSS/BLUE SHIELD

## 2018-07-07 LAB — GLUCOSE, CAPILLARY
Glucose-Capillary: 119 mg/dL — ABNORMAL HIGH (ref 70–99)
Glucose-Capillary: 103 mg/dL — ABNORMAL HIGH (ref 70–99)
Glucose-Capillary: 114 mg/dL — ABNORMAL HIGH (ref 70–99)
Glucose-Capillary: 117 mg/dL — ABNORMAL HIGH (ref 70–99)
Glucose-Capillary: 83 mg/dL (ref 70–99)

## 2018-07-07 LAB — CULTURE, RESPIRATORY W GRAM STAIN: Special Requests: NORMAL

## 2018-07-07 MED ORDER — GABAPENTIN 300 MG PO CAPS
300.0000 mg | ORAL_CAPSULE | Freq: Every evening | ORAL | Status: DC
  Administered 2018-07-07 – 2018-07-11 (×5): 300 mg via ORAL
  Filled 2018-07-07 (×5): qty 1

## 2018-07-07 MED ORDER — CITALOPRAM HYDROBROMIDE 20 MG PO TABS
30.0000 mg | ORAL_TABLET | Freq: Every day | ORAL | Status: DC
  Administered 2018-07-08 – 2018-07-12 (×4): 30 mg via ORAL
  Filled 2018-07-07 (×4): qty 1

## 2018-07-07 MED ORDER — DIAZEPAM 5 MG PO TABS: 2.5000 mg | ORAL_TABLET | Freq: Every evening | ORAL | Status: DC | PRN

## 2018-07-07 MED ORDER — SODIUM CHLORIDE 0.45 % IV SOLN: INTRAVENOUS | Status: DC

## 2018-07-07 MED ORDER — SODIUM CHLORIDE 0.45 % IV SOLN
INTRAVENOUS | Status: AC
  Administered 2018-07-07 – 2018-07-08 (×2): via INTRAVENOUS

## 2018-07-07 NOTE — Progress Notes (Signed)
Patient had mucous plug patient coughed up with strong cough.  Vitals stable.

## 2018-07-07 NOTE — Progress Notes (Signed)
Occupational Therapy Treatment Patient Details Name: Jesse [name] MRN: [id number] DOB: 07/03/1990 Today's Date: 07/07/2018    History of present illness 28 year old male found down in car with suspected drug overdose versus status epilepticus. Intubated 4/20. MRI showing multiple areas of restricted diffusion, predominantly in the cerebellar hemispheres which might be representative of hypoxic/anoxic injury but specifically in bilateral globus pallidus and lentiform nuclei on the right. No significant medical past history but history of alcohol and drug abuse.    OT comments  Pt seen in conjunction with SLP.  Pt moved into chair position.  Worked on head/neck control - he was able to rotate head/neck to Rt and Lt with max facilitation.  He does not attempt to use/move Rt UE today.  He will look to therapist with his eyes  on command.   Pt with increased secretions at end of session (RN aware). Will continue to follow.   Follow Up Recommendations  CIR;Supervision/Assistance - 24 hour    Equipment Recommendations  None recommended by OT    Recommendations for Other Services Rehab consult    Precautions / Restrictions Precautions Precautions: Fall Precaution Comments: trach       Mobility Bed Mobility Overal bed mobility: Needs Assistance             General bed mobility comments: requires total A +2 for all aspects   Transfers                 General transfer comment: did not attempt this date     Balance                                           ADL either performed or assessed with clinical judgement   ADL                                         General ADL Comments: requires total A for all aspects      Vision       Perception     Praxis      Cognition Arousal/Alertness: Awake/alert Behavior During Therapy: Flat affect Overall Cognitive Status: Impaired/Different from baseline Area of Impairment:  Attention;Following commands                   Current Attention Level: Focused   Following Commands: Follows one step commands inconsistently;Follows one step commands with increased time     Problem Solving: Slow processing;Decreased initiation;Requires tactile cues;Requires verbal cues General Comments: Pt will turn eyes to therapist on command.  With max facilitation he will intiate head turn to the Rt.  Did not attempt to follow any other commands today         Exercises General Exercises - Upper Extremity Shoulder Flexion: PROM;Right;10 reps;Supine Shoulder Extension: PROM;Right;10 reps;Supine Elbow Flexion: PROM;Right;10 reps;Supine Elbow Extension: PROM;Right;10 reps;Supine Wrist Flexion: PROM;Right;10 reps;Supine Wrist Extension: PROM;Right;10 reps;Supine Digit Composite Flexion: PROM;Right;10 reps;Supine Composite Extension: PROM;Right;10 reps;Supine Other Exercises Other Exercises: Moved pt into chair position, worked on head/neck control - requires max facilitation for rotation.  Attempted to move into unsupported sitting, but pt required total A, and unable to assist much this session.  He did not attempt to initiate movement with Rt UE today    Shoulder Instructions  General Comments      Pertinent Vitals/ Pain       Pain Assessment: Faces Faces Pain Scale: No hurt Pain Intervention(s): Monitored during session;Repositioned  Home Living                                          Prior Functioning/Environment              Frequency  Min 3X/week        Progress Toward Goals  OT Goals(current goals can now be found in the care plan section)  Progress towards OT goals: Progressing toward goals     Plan Discharge plan remains appropriate    Co-evaluation    PT/OT/SLP Co-Evaluation/Treatment: Yes Reason for Co-Treatment: Complexity of the patient's impairments (multi-system involvement);Necessary to address  cognition/behavior during functional activity;To address functional/ADL transfers   OT goals addressed during session: Strengthening/ROM      AM-PAC OT "6 Clicks" Daily Activity     Outcome Measure   Help from another person eating meals?: Total Help from another person taking care of personal grooming?: Total Help from another person toileting, which includes using toliet, bedpan, or urinal?: Total Help from another person bathing (including washing, rinsing, drying)?: Total Help from another person to put on and taking off regular upper body clothing?: Total Help from another person to put on and taking off regular lower body clothing?: Total 6 Click Score: 6    End of Session Equipment Utilized During Treatment: Oxygen  OT Visit Diagnosis: Muscle weakness (generalized) (M62.81);Other symptoms and signs involving cognitive function   Activity Tolerance Patient limited by lethargy   Patient Left in bed;with call bell/phone within reach;with nursing/sitter in room   Nurse Communication Mobility status        Time: [id number] OT Time Calculation (min): 36 min  Charges: OT General Charges $OT Visit: 1 Visit OT Treatments $Neuromuscular Re-education: 8-22 mins  Jesse HawkingWendi Suraj [name], OTR/L Acute Rehabilitation Services Pager [phone] Office [phone]    Jesse HawkingConarpe, Jesse [name] 07/07/2018, 3:36 PM

## 2018-07-07 NOTE — Progress Notes (Signed)
PROGRESS NOTE  Jesse [name] GNF:[id number] DOB: 1990-12-05 DOA: 06/13/2018 PCP: No primary care provider on file.  HPI/Recap of past 61 hours: 28 year old male who was found down in his vehicle, unresponsive, suspected postictal. He had several unmarked pills around him. Patient did not respond to naloxone. While at the emergency department he required intubation for airway protection. Upon admission to the intensive care unit he was hypotensive, required vasopressors.  He had a prolonged hospital stay, he was diagnosed with staph aureus and Klebsiella pneumonia. He continued to be encephalopathic, possible anoxic injury. He required tracheostomy April 30, in orderto be liberated from mechanical ventilation.  07/07/18: Patient seen and examined at his bedside.  He is more alert today and tracking with his eyes.  Still does not follow commands.  Fever this afternoon with T-max of 100.5.  Will consult infectious disease.  Updated the patient's mother on the phone.  Discussed case with Jeanice Lim at [facility] who requested PEG tube placement prior to transfer if patient is unable to tolerate p.o. intake.     Assessment/Plan: Active Problems:   Altered mental status   Acute respiratory failure with hypoxia (HCC)   Status post tracheostomy (HCC)   Drug overdose   Shock (HCC)   Goals of care, counseling/discussion   Palliative care by specialist   AKI (acute kidney injury) (HCC)   Tachypnea   Acute blood loss anemia   Drug addiction (HCC)   Anoxic brain injury (HCC)   Palliative care encounter  1. Acute hypoxic and hypercapnic respiratory failure due to encephalopathy, possible hypoxic brain injury.  Tolerating trach collar well.   Continue trach suctioning as needed Continue Keppra for seizure prophylaxis Patient's mother requests transfer to [facility] unit to continue physical therapy Called with complaint and spoke with holding who requested PEG tube placement with  patient is unable to tolerate oral intake Awaiting assessment by speech therapist and swallow evaluation Maintain O2 saturation greater than 90% Repeat chest x-ray done this morning did not show any lobular infiltrates, independently reviewed.  2.  Sepsis secondary to Klebsiella ESBL HCAP  Leukocytosis improving from 25K to 10K this morning Tachycardia and low-grade fever persistent  Continued withmeropenem for antibiotic therapy,Continue oxymetry monitoring and aspiration precautions. Now off steroids. Remains on minimal O2 support.  Continue suctioning  Repeated blood cultures x2 peripherally done on 07/05/2018- to date We will consult ID due to persistent fevers  3.  Resolved urinary retention. Condom cath in place with good urine output -5.3 L since admission  4.  Polysubstance abuse including cocaine UDS done on 06/13/2018+ for cocaine and benzodiazepine  5.  Chronic depression and anxiety: Resume antidepressant Celexa, gabapentin, and Valium as needed  6.  ADHD: Continue to hold her Vyvanse due to concern for lowering seizure threshold  7.  Dysphagia: Speech therapist to assess.  Aspiration precaution.  IR consulted for PEG tube placement.   DVT prophylaxis: Lovenox subQ Code Status: Full Family Communication:  Updated mother on the phone on 07/07/2018 Disposition Plan: Uncertain at this time    Objective: Vitals:   07/07/18 1130 07/07/18 1155 07/07/18 1522 07/07/18 1600  BP:  109/71  127/89  Pulse: (!) 125 (!) 111  (!) 112  Resp: (!) Temp:  (!) 100.9 F (38.3 C)  (!) 100.5 F (38.1 C)  TempSrc:  Axillary  Axillary  SpO2: 97% 97% 97% 96%  Weight:      Height:        Intake/Output  Summary (Last 24 hours) at 07/07/2018 1648 Last data filed at 07/07/2018 1201 Gross per 24 hour  Intake --  Output 1850 ml  Net -1850 ml   Filed Weights   07/04/18 0400 07/05/18 0500 07/07/18 0500  Weight: 72.3 kg 77.2 kg 77.2 kg    Exam:   General: 28 y.o.  year-old male well-developed well-nourished in no acute distress.  Awake and tracking with his eyes.  Does not follow commands.  Cardiovascular: Regular rate and rhythm with no rubs or gallops.  No JVD thyromegaly noted.  Respiratory: Clear to auscultation with no wheezes or rales.  Poor inspiratory effort.    Abdomen: Soft nontender nondistended with normal bowel sounds x4 quadrants.    Musculoskeletal: Trace edema in lower extremities bilaterally.    Psychiatry: Unable to assess mood due to suspected anoxic brain injury.  Data Reviewed: CBC: Recent Labs  Lab 07/06/18 0159  WBC 10.3  HGB 14.4  HCT 44.1  MCV 93.0  PLT 345   Basic Metabolic Panel: Recent Labs  Lab 07/02/18 0332 07/06/18 0159  NA 141 145  K 4.1 3.9  CL 100 111  CO2 27 26  GLUCOSE 107* 125*  BUN 27* 31*  CREATININE 0.68 0.70  CALCIUM 9.9 9.3   GFR: Estimated Creatinine Clearance: 154.1 mL/min (by C-G formula based on SCr of 0.7 mg/dL). Liver Function Tests: No results for input(s): AST, ALT, ALKPHOS, BILITOT, PROT, ALBUMIN in the last 168 hours. No results for input(s): LIPASE, AMYLASE in the last 168 hours. No results for input(s): AMMONIA in the last 168 hours. Coagulation Profile: No results for input(s): INR, PROTIME in the last 168 hours. Cardiac Enzymes: No results for input(s): CKTOTAL, CKMB, CKMBINDEX, TROPONINI in the last 168 hours. BNP (last 3 results) No results for input(s): PROBNP in the last 8760 hours. HbA1C: No results for input(s): HGBA1C in the last 72 hours. CBG: Recent Labs  Lab 07/06/18 2332 07/07/18 0347 07/07/18 0823 07/07/18 1157 07/07/18 1611  GLUCAP 93 119* 103* 114* 117*   Lipid Profile: No results for input(s): CHOL, HDL, LDLCALC, TRIG, CHOLHDL, LDLDIRECT in the last 72 hours. Thyroid Function Tests: No results for input(s): TSH, T4TOTAL, FREET4, T3FREE, THYROIDAB in the last 72 hours. Anemia Panel: No results for input(s): VITAMINB12, FOLATE, FERRITIN, TIBC,  IRON, RETICCTPCT in the last 72 hours. Urine analysis:    Component Value Date/Time   COLORURINE YELLOW 07/05/2018 1200   APPEARANCEUR HAZY (A) 07/05/2018 1200   LABSPEC 1.032 (H) 07/05/2018 1200   PHURINE 7.0 07/05/2018 1200   GLUCOSEU NEGATIVE 07/05/2018 1200   HGBUR MODERATE (A) 07/05/2018 1200   BILIRUBINUR NEGATIVE 07/05/2018 1200   KETONESUR 5 (A) 07/05/2018 1200   PROTEINUR 30 (A) 07/05/2018 1200   NITRITE NEGATIVE 07/05/2018 1200   LEUKOCYTESUR NEGATIVE 07/05/2018 1200   Sepsis Labs: @LABRCNTIP (procalcitonin:4,lacticidven:4)  ) Recent Results (from the past 240 hour(s))  Culture, Urine     Status: None   Collection Time: 07/05/18  9:41 AM  Result Value Ref Range Status   Specimen Description URINE, RANDOM  Final   Special Requests Normal  Final   Culture   Final    NO GROWTH Performed at [facility], [address] [place], [place] [address]    Report Status 07/06/2018 FINAL  Final  Expectorated sputum assessment w rflx to resp cult     Status: None   Collection Time: 07/05/18  9:41 AM  Result Value Ref Range Status   Specimen Description EXPECTORATED SPUTUM  Final   Special Requests Normal  Final   Sputum evaluation   Final    THIS SPECIMEN IS ACCEPTABLE FOR SPUTUM CULTURE Performed at [facility], [address] [place], [place] [address]    Report Status 07/05/2018 FINAL  Final  Culture, respiratory     Status: None   Collection Time: 07/05/18  9:41 AM  Result Value Ref Range Status   Specimen Description EXPECTORATED SPUTUM  Final   Special Requests Normal Reflexed from [id number]  Final   Gram Stain   Final    MODERATE WBC PRESENT, PREDOMINANTLY PMN RARE SQUAMOUS EPITHELIAL CELLS PRESENT NO ORGANISMS SEEN Performed at [facility], [address] [place], [place] [address]    Culture FEW CANDIDA ALBICANS  Final   Report Status 07/07/2018 FINAL  Final  Culture, blood (routine x 2)     Status: None (Preliminary result)   Collection  Time: 07/05/18 10:00 AM  Result Value Ref Range Status   Specimen Description BLOOD RIGHT HAND  Final   Special Requests   Final    BOTTLES DRAWN AEROBIC AND ANAEROBIC Blood Culture adequate volume   Culture   Final    NO GROWTH 2 DAYS Performed at [facility], [address] [place], [place] [address]    Report Status PENDING  Incomplete  Culture, blood (routine x 2)     Status: None (Preliminary result)   Collection Time: 07/05/18 10:15 AM  Result Value Ref Range Status   Specimen Description BLOOD LEFT HAND  Final   Special Requests   Final    BOTTLES DRAWN AEROBIC AND ANAEROBIC Blood Culture adequate volume   Culture   Final    NO GROWTH 2 DAYS Performed at [facility], [address] [place], [place] [address]    Report Status PENDING  Incomplete  SARS Coronavirus 2 (CEPHEID - Performed in [facility] hospital lab), Hosp Order     Status: None   Collection Time: 07/05/18 10:55 AM  Result Value Ref Range Status   SARS Coronavirus 2 NEGATIVE NEGATIVE Final    Comment: (NOTE) If result is NEGATIVE SARS-CoV-2 target nucleic acids are NOT DETECTED. The SARS-CoV-2 RNA is generally detectable in upper and lower  respiratory specimens during the acute phase of infection. The lowest  concentration of SARS-CoV-2 viral copies this assay can detect is 250  copies / mL. A negative result does not preclude SARS-CoV-2 infection  and should not be used as the sole basis for treatment or other  patient management decisions.  A negative result may occur with  improper specimen collection / handling, submission of specimen other  than nasopharyngeal swab, presence of viral mutation(s) within the  areas targeted by this assay, and inadequate number of viral copies  (<250 copies / mL). A negative result must be combined with clinical  observations, patient history, and epidemiological information. If result is POSITIVE SARS-CoV-2 target nucleic acids are DETECTED. The  SARS-CoV-2 RNA is generally detectable in upper and lower  respiratory specimens dur ing the acute phase of infection.  Positive  results are indicative of active infection with SARS-CoV-2.  Clinical  correlation with patient history and other diagnostic information is  necessary to determine patient infection status.  Positive results do  not rule out bacterial infection or co-infection with other viruses. If result is PRESUMPTIVE POSTIVE SARS-CoV-2 nucleic acids MAY BE PRESENT.   A presumptive positive result was obtained on the submitted specimen  and  confirmed on repeat testing.  While 2019 novel coronavirus  (SARS-CoV-2) nucleic acids may be present in the submitted sample  additional confirmatory testing may be necessary for epidemiological  and / or clinical management purposes  to differentiate between  SARS-CoV-2 and other Sarbecovirus currently known to infect humans.  If clinically indicated additional testing with an alternate test  methodology [phone]) is advised. The SARS-CoV-2 RNA is generally  detectable in upper and lower respiratory sp ecimens during the acute  phase of infection. The expected result is Negative. Fact Sheet for Patients:  BoilerBrush.com.cy Fact Sheet for Healthcare Providers: https://pope.com/ This test is not yet approved or cleared by the [place] FDA and has been authorized for detection and/or diagnosis of SARS-CoV-2 by FDA under an Emergency Use Authorization (EUA).  This EUA will remain in effect (meaning this test can be used) for the duration of the COVID-19 declaration under Section 564(b)(1) of the Act, 21 U.S.C. section 360bbb-3(b)(1), unless the authorization is terminated or revoked sooner. Performed at [facility], [address] [place], [place] [address]   MRSA PCR Screening     Status: None   Collection Time: 07/05/18  1:00 PM  Result Value Ref Range Status   MRSA by  PCR NEGATIVE NEGATIVE Final    Comment:        The GeneXpert MRSA Assay (FDA approved for NASAL specimens only), is one component of a comprehensive MRSA colonization surveillance program. It is not intended to diagnose MRSA infection nor to guide or monitor treatment for MRSA infections. Performed at [facility], [address], [place], [place] [address]       Studies: Dg Chest Port 1 View  Result Date: 07/07/2018 CLINICAL DATA:  28 year old male with tracheostomy and hypoxia. EXAM: PORTABLE CHEST 1 VIEW COMPARISON:  07/04/2018 and earlier. FINDINGS: Portable AP semi upright view at 0606 hours. Tracheostomy tube with no adverse features. Stable visible enteric tube. Normal cardiac size and mediastinal contours. Platelike atelectasis at the right lung base is stable. No pneumothorax, pulmonary edema, pleural effusion or other confluent opacity. Negative visible bowel gas pattern. IMPRESSION: 1. Tracheostomy tube with no adverse features. 2. Stable atelectasis at the right lung base. Electronically Signed   By: Odessa Fleming M.D.   On: 07/07/2018 08:48    Scheduled Meds:  bethanechol  10 mg Per Tube TID   chlorhexidine  15 mL Mouth Rinse BID   enoxaparin (LOVENOX) injection  40 mg Subcutaneous Q24H   feeding supplement (PRO-STAT SUGAR FREE 64)  30 mL Per Tube Daily   ipratropium-albuterol  3 mL Nebulization TID   levETIRAcetam  500 mg Per Tube BID   mouth rinse  15 mL Mouth Rinse 10 times per day   propranolol  40 mg Per Tube Q2000    Continuous Infusions:  sodium chloride 75 mL/hr at 07/07/18 1245   sodium chloride 250 mL (07/06/18 1041)   feeding supplement (OSMOLITE 1.5 CAL) 65 mL/hr at 07/03/18 2357   meropenem (MERREM) IV 2 g (07/07/18 1123)     LOS: 24 days     Darlin Drop, MD [facility] Pager [phone]  If 7PM-7AM, please contact night-coverage [email or web address] Password [facility] 07/07/2018, 4:48 PM

## 2018-07-07 NOTE — Progress Notes (Signed)
Assisted tele visit to patient with mother.  Jesse Spaid Ferrer, RN  

## 2018-07-07 NOTE — Progress Notes (Addendum)
  Speech Language Pathology Treatment: Cognitive-Linquistic;Passy Muir Speaking valve  Patient Details Name: Jesse [name] MRN: [id number] DOB: 15-Jun-1990 Today's Date: 07/07/2018 Time: 0174-9449 SLP Time Calculation (min) (ACUTE ONLY): 15 min  Assessment / Plan / Recommendation Clinical Impression  Jesse [name]'s eyes are open, will track spontaneously or on request but is more listless this week- suspect due to his fever. He has made little to no spontaneous labial/lip/mandibular movement and was unable to open mouth on command. Mild opening achieved after pressure on mandible and protrude tongue on command. Minimal audible phonation during exhalation prior to PMV placement. Once valve placed no phonation and increased work of breathing, wheezing with signs of back pressure possibly related to mucous plug earlier with RTT. Trial was 45 seconds at greatest. Jesse [name] used eye blink to respond to yes question x 1 but could not elicit further y/n responses.    HPI HPI: 28 year old male found down in car with suspected drug overdose versus status epilepticus. Intubated 4/20. 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 No significant medical past history but history of alcohol and drug abuse      SLP Plan  Continue with current plan of care       Recommendations         Patient may use Passy-Muir Speech Valve: with SLP only PMSV Supervision: Full MD: Please consider changing trach tube to : Smaller size;Cuffless         Oral Care Recommendations: Oral care QID Follow up Recommendations: Inpatient Rehab SLP Visit Diagnosis: Aphonia (R49.1);Cognitive communication deficit (Q75.916) Plan: Continue with current plan of care                        Jesse [name] Jesse [name] 07/07/2018, 1:55 PM  Breck Coons Jesse [name] Jesse [name].Ed Nurse, children's [phone] Office [phone]

## 2018-07-08 ENCOUNTER — Inpatient Hospital Stay (HOSPITAL_COMMUNITY): Payer: BLUE CROSS/BLUE SHIELD

## 2018-07-08 DIAGNOSIS — Z960 Presence of urogenital implants: Secondary | ICD-10-CM

## 2018-07-08 DIAGNOSIS — J302 Other seasonal allergic rhinitis: Secondary | ICD-10-CM

## 2018-07-08 DIAGNOSIS — Z87820 Personal history of traumatic brain injury: Secondary | ICD-10-CM

## 2018-07-08 DIAGNOSIS — Z8619 Personal history of other infectious and parasitic diseases: Secondary | ICD-10-CM

## 2018-07-08 DIAGNOSIS — R509 Fever, unspecified: Secondary | ICD-10-CM

## 2018-07-08 LAB — CBC WITH DIFFERENTIAL/PLATELET
Abs Immature Granulocytes: 0.02 10*3/uL (ref 0.00–0.07)
Basophils Absolute: 0.1 10*3/uL (ref 0.0–0.1)
Basophils Relative: 1 %
Eosinophils Absolute: 0.3 10*3/uL (ref 0.0–0.5)
Eosinophils Relative: 3 %
HCT: 41.6 % (ref 39.0–52.0)
Hemoglobin: 13.4 g/dL (ref 13.0–17.0)
Immature Granulocytes: 0 %
Lymphocytes Relative: 19 %
Lymphs Abs: 1.7 10*3/uL (ref 0.7–4.0)
MCH: 30.2 pg (ref 26.0–34.0)
MCHC: 32.2 g/dL (ref 30.0–36.0)
MCV: 93.7 fL (ref 80.0–100.0)
Monocytes Absolute: 0.7 10*3/uL (ref 0.1–1.0)
Monocytes Relative: 8 %
Neutro Abs: 6 10*3/uL (ref 1.7–7.7)
Neutrophils Relative %: 69 %
Platelets: 235 10*3/uL (ref 150–400)
RBC: 4.44 MIL/uL (ref 4.22–5.81)
RDW: 12 % (ref 11.5–15.5)
WBC: 8.7 10*3/uL (ref 4.0–10.5)
nRBC: 0 % (ref 0.0–0.2)

## 2018-07-08 LAB — GLUCOSE, CAPILLARY
Glucose-Capillary: 102 mg/dL — ABNORMAL HIGH (ref 70–99)
Glucose-Capillary: 102 mg/dL — ABNORMAL HIGH (ref 70–99)
Glucose-Capillary: 106 mg/dL — ABNORMAL HIGH (ref 70–99)
Glucose-Capillary: 107 mg/dL — ABNORMAL HIGH (ref 70–99)
Glucose-Capillary: 91 mg/dL (ref 70–99)
Glucose-Capillary: 93 mg/dL (ref 70–99)
Glucose-Capillary: 94 mg/dL (ref 70–99)

## 2018-07-08 LAB — COMPREHENSIVE METABOLIC PANEL
ALT: 74 U/L — ABNORMAL HIGH (ref 0–44)
AST: 55 U/L — ABNORMAL HIGH (ref 15–41)
Albumin: 3 g/dL — ABNORMAL LOW (ref 3.5–5.0)
Alkaline Phosphatase: 68 U/L (ref 38–126)
Anion gap: 7 (ref 5–15)
BUN: 31 mg/dL — ABNORMAL HIGH (ref 6–20)
CO2: 30 mmol/L (ref 22–32)
Calcium: 9.1 mg/dL (ref 8.9–10.3)
Chloride: 108 mmol/L (ref 98–111)
Creatinine, Ser: 0.71 mg/dL (ref 0.61–1.24)
GFR calc Af Amer: >60 mL/min (ref >60)
GFR calc non Af Amer: >60 mL/min (ref >60)
Glucose, Bld: 125 mg/dL — ABNORMAL HIGH (ref 70–99)
Potassium: 3.8 mmol/L (ref 3.5–5.1)
Sodium: 145 mmol/L (ref 135–145)
Total Bilirubin: 0.7 mg/dL (ref 0.3–1.2)
Total Protein: 7 g/dL (ref 6.5–8.1)

## 2018-07-08 LAB — PROTIME-INR
INR: 1.3 — ABNORMAL HIGH (ref 0.8–1.2)
Prothrombin Time: 15.8 seconds — ABNORMAL HIGH (ref 11.4–15.2)

## 2018-07-08 MED ORDER — ENOXAPARIN SODIUM 40 MG/0.4ML ~~LOC~~ SOLN
40.0000 mg | SUBCUTANEOUS | Status: DC
  Administered 2018-07-09 – 2018-07-10 (×2): 40 mg via SUBCUTANEOUS
  Filled 2018-07-08 (×2): qty 0.4

## 2018-07-08 MED ORDER — IBUPROFEN 200 MG PO TABS
200.0000 mg | ORAL_TABLET | Freq: Four times a day (QID) | ORAL | Status: DC | PRN
  Administered 2018-07-08: 200 mg via ORAL
  Filled 2018-07-08: qty 1

## 2018-07-08 MED ORDER — ALBUTEROL SULFATE (2.5 MG/3ML) 0.083% IN NEBU: 2.5000 mg | INHALATION_SOLUTION | RESPIRATORY_TRACT | Status: DC | PRN

## 2018-07-08 NOTE — Progress Notes (Signed)
Assisted tele visit to patient with father.  Jesse [name], Micki Riley, RN,BSN,CCRN

## 2018-07-08 NOTE — Progress Notes (Signed)
Physical Therapy Treatment Patient Details Name: Jesse [name] MRN: [id number] DOB: 1990/08/30 Today's Date: 07/08/2018    History of Present Illness 28 year old male found down in car with suspected drug overdose versus status epilepticus. Intubated 4/20. MRI showing multiple areas of restricted diffusion, predominantly in the cerebellar hemispheres which might be representative of hypoxic/anoxic injury but specifically in bilateral globus pallidus and lentiform nuclei on the right. No significant medical past history but history of alcohol and drug abuse.     PT Comments    Pt remaining similar presentation to 5/13 session. Pt diaphoretic upon arrival, noted fever this AM. Pt alert and visually tracking therapist around the room, following simple commands to look to left and right only. Session focused on PROM to bilateral upper and lower extremities. Noted very slight right elbow flexion and cervical rotation with max facilitation. Continued to utilize "[employer]," music for patient engagement. Bed left in chair position at end of session as pt seemed to be tolerating it well (VSS). Will continue to progress as tolerated.    Follow Up Recommendations  CIR     Equipment Recommendations       Recommendations for Other Services       Precautions / Restrictions Precautions Precautions: Fall Precaution Comments: trach Restrictions Weight Bearing Restrictions: No    Mobility  Bed Mobility Overal bed mobility: Needs Assistance             General bed mobility comments: Bed placed in Egress position  Transfers                 General transfer comment: did not attempt this date   Ambulation/Gait                 Stairs             Wheelchair Mobility    Modified Rankin (Stroke Patients Only)       Balance                                            Cognition Arousal/Alertness: Awake/alert Behavior During Therapy: Flat  affect Overall Cognitive Status: Impaired/Different from baseline Area of Impairment: Attention;Following commands                   Current Attention Level: Focused   Following Commands: Follows one step commands inconsistently;Follows one step commands with increased time     Problem Solving: Slow processing;Decreased initiation;Requires tactile cues;Requires verbal cues General Comments: Pt turning eyes to therapist on command, visually tracking       Exercises General Exercises - Upper Extremity Shoulder Flexion: PROM;Right;10 reps;Supine Elbow Flexion: PROM;Right;10 reps;Supine Elbow Extension: PROM;Right;10 reps;Supine General Exercises - Lower Extremity Ankle Circles/Pumps: PROM;Left;Right;Supine;10 reps Short Arc Quad: PROM;Both;Supine;10 reps Hip Flexion/Marching: PROM;Right;Left;Supine;10 reps Other Exercises Other Exercises: AAROM cervical rotation to left and right Other Exercises: PROM bilateral hip internal/external rotation    General Comments        Pertinent Vitals/Pain Pain Assessment: Faces Faces Pain Scale: No hurt Pain Intervention(s): Monitored during session;Repositioned    Home Living                      Prior Function            PT Goals (current goals can now be found in the care plan section) Acute Rehab PT Goals Patient Stated  Goal: unable to state    Frequency    Min 3X/week      PT Plan Frequency needs to be updated    Co-evaluation              AM-PAC PT "6 Clicks" Mobility   Outcome Measure  Help needed turning from your back to your side while in a flat bed without using bedrails?: Total Help needed moving from lying on your back to sitting on the side of a flat bed without using bedrails?: Total Help needed moving to and from a bed to a chair (including a wheelchair)?: Total Help needed standing up from a chair using your arms (e.g., wheelchair or bedside chair)?: Total Help needed to walk in  hospital room?: Total Help needed climbing 3-5 steps with a railing? : Total 6 Click Score: 6    End of Session   Activity Tolerance: Patient tolerated treatment well Patient left: in bed;with call bell/phone within reach;with SCD's reapplied(in chair position) Nurse Communication: Mobility status PT Visit Diagnosis: Other symptoms and signs involving the nervous system (X44.818)     Time: 5631-4970 PT Time Calculation (min) (ACUTE ONLY): 23 min  Charges:  $Therapeutic Exercise: 23-37 mins                     Laurina Bustle, PT, DPT Acute Rehabilitation Services Pager [phone] Office [phone]    Vanetta Mulders 07/08/2018, 1:43 PM

## 2018-07-08 NOTE — Consult Note (Signed)
[facility]       Reason for Consult: fever    Referring Physician: Dr. Margo Aye  Active Problems:   Altered mental status   Acute respiratory failure with hypoxia (HCC)   Status post tracheostomy (HCC)   Drug overdose   Shock (HCC)   Goals of care, counseling/discussion   Palliative care by specialist   AKI (acute kidney injury) (HCC)   Tachypnea   Acute blood loss anemia   Drug addiction (HCC)   Anoxic brain injury (HCC)   Palliative care encounter    bethanechol  10 mg Per Tube TID   chlorhexidine  15 mL Mouth Rinse BID   citalopram  30 mg Oral Daily   [START ON 07/09/2018] enoxaparin (LOVENOX) injection  40 mg Subcutaneous Q24H   feeding supplement (PRO-STAT SUGAR FREE 64)  30 mL Per Tube Daily   gabapentin  300-600 mg Oral QPM   levETIRAcetam  500 mg Per Tube BID   mouth rinse  15 mL Mouth Rinse 10 times per day   propranolol  40 mg Per Tube Q2000    Recommendations: Stop meropenem Lipase Acute hepatitis panel  Assessment: He has a fever of unknown etiology.  No signs of infection otherwise.  CXR without concerns.  Possible central fever  I will observe off of antibiotics.     Dr. Orvan Falconer will see over the weekend.   Antibiotics: Antibiotics day 14 Meropenem day 12  HPI: Jesse [name] is a 28 y.o. 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  He was intially intubated and has remained vent dependent with a trach in place.  His previous cultures include an ESBL Klebsiella and MSSA culture from a trach aspirate.  He has had no positive blood cultures.   Called due to persistent fever despite antibiotics with no obvious source.  He has been on broad spectrum antibiotics as above for 2 weeks.  Febrile to 100.9 this am with fevers to 101 on 5/12 and  5/13.  His vent settings have remained stable.  + external urinary catheter.  LFTs have been up both initially and recently.    Review of Systems: Unobtainable due to patient factors   Past Medical History:  Diagnosis Date   Anxiety    Depression    Fetal alcohol syndrome    History of attempted suicide    Polysubstance abuse (HCC)    cocaine, opiates   From chart review, not obtainable from the patient  Social History   Tobacco Use   Smoking status: Not on file  Substance Use Topics   Alcohol use: Not on file   Drug use: Not on file   From chart review, not obtainable from the patient  Family History  Adopted: Yes  Family history unknown: Yes   From chart review, not obtainable from the patient  Allergies  Allergen Reactions   Other Other (See Comments)    Seasonal allergies- Stuffy nose, itchy eyes, congestion, etc..   From chart review, not obtainable from the patient  Physical Exam: Constitutional: NAD   Vitals:   07/08/18 0815 07/08/18 1128  BP:    Pulse: (!) 102 (!) 112  Resp: 17 18  Temp:    SpO2: 96% 97%   EYES: anicteric ENMT: +trach Cardiovascular: Cor RRR Respiratory: breathing assisted on vent; coarse  breath sounds anteriorly GI: Bowel sounds are normal, liver is not enlarged, spleen is not enlarged Musculoskeletal: + diffuse mild edema noted, upper extremity contractures Skin: negatives: no rash; small ulcer on back per nursing Neuro: unresponsive   Lab Results  Component Value Date   WBC 8.7 07/08/2018   HGB 13.4 07/08/2018   HCT 41.6 07/08/2018   MCV 93.7 07/08/2018   PLT 235 07/08/2018    Lab Results  Component Value Date   CREATININE 0.71 07/08/2018   BUN 31 (H) 07/08/2018   NA 145 07/08/2018   K 3.8 07/08/2018   CL 108 07/08/2018   CO2 30 07/08/2018    Lab Results  Component Value Date   ALT 74 (H) 07/08/2018   AST 55 (H) 07/08/2018   ALKPHOS 68 07/08/2018     Microbiology: Recent Results (from the past  240 hour(s))  Culture, Urine     Status: None   Collection Time: 07/05/18  9:41 AM  Result Value Ref Range Status   Specimen Description URINE, RANDOM  Final   Special Requests Normal  Final   Culture   Final    NO GROWTH Performed at [facility], [address], [place], [place] [address]    Report Status 07/06/2018 FINAL  Final  Expectorated sputum assessment w rflx to resp cult     Status: None   Collection Time: 07/05/18  9:41 AM  Result Value Ref Range Status   Specimen Description EXPECTORATED SPUTUM  Final   Special Requests Normal  Final   Sputum evaluation   Final    THIS SPECIMEN IS ACCEPTABLE FOR SPUTUM CULTURE Performed at [facility], [address], [place], [place] [address]    Report Status 07/05/2018 FINAL  Final  Culture, respiratory     Status: None   Collection Time: 07/05/18  9:41 AM  Result Value Ref Range Status   Specimen Description EXPECTORATED SPUTUM  Final   Special Requests Normal Reflexed from [id number]  Final   Gram Stain   Final    MODERATE WBC PRESENT, PREDOMINANTLY PMN RARE SQUAMOUS EPITHELIAL CELLS PRESENT NO ORGANISMS SEEN Performed at [facility], [address] [place], [place] [address]    Culture FEW CANDIDA ALBICANS  Final   Report Status 07/07/2018 FINAL  Final  Culture, blood (routine x 2)     Status: None (Preliminary result)   Collection Time: 07/05/18 10:00 AM  Result Value Ref Range Status   Specimen Description BLOOD RIGHT HAND  Final   Special Requests   Final    BOTTLES DRAWN AEROBIC AND ANAEROBIC Blood Culture adequate volume   Culture   Final    NO GROWTH 3 DAYS Performed at [facility], [address], [place], [place] [address]    Report Status PENDING  Incomplete  Culture, blood (routine x 2)     Status: None (Preliminary result)   Collection Time: 07/05/18 10:15 AM  Result Value Ref Range Status   Specimen Description BLOOD LEFT HAND  Final   Special Requests   Final    BOTTLES DRAWN  AEROBIC AND ANAEROBIC Blood Culture adequate volume   Culture   Final    NO GROWTH 3 DAYS Performed at [facility], [address] [place], [place] [address]    Report Status PENDING  Incomplete  SARS Coronavirus 2 (CEPHEID - Performed in [facility] hospital lab), Hosp Order     Status: None   Collection Time:  07/05/18 10:55 AM  Result Value Ref Range Status   SARS Coronavirus 2 NEGATIVE NEGATIVE Final    Comment: (NOTE) If result is NEGATIVE SARS-CoV-2 target nucleic acids are NOT DETECTED. The SARS-CoV-2 RNA is generally detectable in upper and lower  respiratory specimens during the acute phase of infection. The lowest  concentration of SARS-CoV-2 viral copies this assay can detect is 250  copies / mL. A negative result does not preclude SARS-CoV-2 infection  and should not be used as the sole basis for treatment or other  patient management decisions.  A negative result may occur with  improper specimen collection / handling, submission of specimen other  than nasopharyngeal swab, presence of viral mutation(s) within the  areas targeted by this assay, and inadequate number of viral copies  (<250 copies / mL). A negative result must be combined with clinical  observations, patient history, and epidemiological information. If result is POSITIVE SARS-CoV-2 target nucleic acids are DETECTED. The SARS-CoV-2 RNA is generally detectable in upper and lower  respiratory specimens dur ing the acute phase of infection.  Positive  results are indicative of active infection with SARS-CoV-2.  Clinical  correlation with patient history and other diagnostic information is  necessary to determine patient infection status.  Positive results do  not rule out bacterial infection or co-infection with other viruses. If result is PRESUMPTIVE POSTIVE SARS-CoV-2 nucleic acids MAY BE PRESENT.   A presumptive positive result was obtained on the submitted specimen  and confirmed on repeat  testing.  While 2019 novel coronavirus  (SARS-CoV-2) nucleic acids may be present in the submitted sample  additional confirmatory testing may be necessary for epidemiological  and / or clinical management purposes  to differentiate between  SARS-CoV-2 and other Sarbecovirus currently known to infect humans.  If clinically indicated additional testing with an alternate test  methodology [phone]) is advised. The SARS-CoV-2 RNA is generally  detectable in upper and lower respiratory sp ecimens during the acute  phase of infection. The expected result is Negative. Fact Sheet for Patients:  BoilerBrush.com.cyhttps://www.fda.gov/media/136312/download Fact Sheet for Healthcare Providers: https://pope.com/https://www.fda.gov/media/136313/download This test is not yet approved or cleared by the [place] FDA and has been authorized for detection and/or diagnosis of SARS-CoV-2 by FDA under an Emergency Use Authorization (EUA).  This EUA will remain in effect (meaning this test can be used) for the duration of the COVID-19 declaration under Section 564(b)(1) of the Act, 21 U.S.C. section 360bbb-3(b)(1), unless the authorization is terminated or revoked sooner. Performed at [facility], [address] [place], [place] [address]   MRSA PCR Screening     Status: None   Collection Time: 07/05/18  1:00 PM  Result Value Ref Range Status   MRSA by PCR NEGATIVE NEGATIVE Final    Comment:        The GeneXpert MRSA Assay (FDA approved for NASAL specimens only), is one component of a comprehensive MRSA colonization surveillance program. It is not intended to diagnose MRSA infection nor to guide or monitor treatment for MRSA infections. Performed at [facility], [address] [place], [place] [address]     Gardiner Barefootobert W Lachrista Heslin, MD [facility] [email or web address] 07/08/2018, 11:44 AM

## 2018-07-08 NOTE — Progress Notes (Addendum)
PROGRESS NOTE  Jesse [name] ZOX:[id number] DOB: 07/15/1990 DOA: 06/13/2018 PCP: No primary care provider on file.  HPI/Recap of past 14 hours: 28 year old male who was found down in his vehicle, unresponsive, suspected postictal. He had several unmarked pills around him. Patient did not respond to naloxone. While at the emergency department he required intubation for airway protection. Upon admission to the intensive care unit he was hypotensive, required vasopressors.  He had a prolonged hospital stay, he was diagnosed with staph aureus and Klebsiella pneumonia. He continued to be encephalopathic, possible anoxic injury. He required tracheostomy April 30, in orderto be liberated from mechanical ventilation.  07/08/18: Patient seen and examined at his bedside.  Persistent fevers noted T-max 101.5.  Patient is nonverbal.  He tracks with his eyes.  Inconsistent with following commands with minimal movement to his extremities.  Patient able to convey desire via eye blinks.     Assessment/Plan: Active Problems:   Altered mental status   Acute respiratory failure with hypoxia (HCC)   Status post tracheostomy (HCC)   Drug overdose   Shock (HCC)   Goals of care, counseling/discussion   Palliative care by specialist   AKI (acute kidney injury) (HCC)   Tachypnea   Acute blood loss anemia   Drug addiction (HCC)   Anoxic brain injury (HCC)   Palliative care encounter  1. Acute hypoxic and hypercapnic respiratory failure due to encephalopathy, possible anoxic brain injury.  Tolerating trach collar well.   Continue trach suctioning CCM for possible trach tube exchange Continue Keppra for seizure prophylaxis Mother has requested transfer to [facility] unit to continue physical rehab  2.  Sepsis secondary to Klebsiella ESBL HCAP  Leukocytosis improving from 25K to 10K this morning Tachycardia and fever persistent Currently on meropenem IV vancomycin was stopped due  to negative MRSA screening Blood cultures x2 negative to date Infectious disease consulted to further assess  3.    Recurrent fevers Management as stated above Infectious disease consulted and following  4.  Resolved urinary retention. Condom cath in place with good urine output net I&O -4.2 L since admission  5.  Polysubstance abuse including cocaine UDS done on 06/13/2018+ for cocaine and benzodiazepine  6.  Chronic depression and anxiety: Continue antidepressant Celexa, gabapentin, and Valium as needed  7.  ADHD: Continue to hold off Vyvanse due to concern for lowering seizure threshold  8.  Dysphagia: Speech therapist to assess.  Aspiration precaution.  IR consulted for PEG tube placement.   DVT prophylaxis: Lovenox subQ Code Status: Full Family Communication:  Updated mother on the phone on 07/07/2018 Disposition Plan:  Possible transfer to [facility] once PEG tube is placed and patient is medically cleared.    Objective: Vitals:   07/08/18 0500 07/08/18 0800 07/08/18 0815 07/08/18 1128  BP:  115/72    Pulse:  98 (!) 102 (!) 112  Resp:  Temp:  (!) 100.9 F (38.3 C)    TempSrc:  Axillary    SpO2:  95% 96% 97%  Weight: 77.3 kg     Height:        Intake/Output Summary (Last 24 hours) at 07/08/2018 1145 Last data filed at 07/08/2018 0700 Gross per 24 hour  Intake 1808.63 ml  Output 1800 ml  Net 8.63 ml   Filed Weights   07/05/18 0500 07/07/18 0500 07/08/18 0500  Weight: 77.2 kg 77.2 kg 77.3 kg    Exam:  . General: 28 y.o. year-old male well-developed well-nourished no  acute distress.  Trach collar in place.  Tracks with his eyes.  No apparent movement to voice upper and lower limbs.  Cortract in place. . Cardiovascular: Regular rate and rhythm with no rubs or gallops.  No JVD or thyromegaly noted.   Marland Kitchen Respiratory: Clear to auscultation with no wheezes or rales.  Poor inspiratory effort. . Abdomen: Soft nontender nondistended with normal bowel sounds  x4.   . Musculoskeletal: Trace edema lower extremities bilaterally. Marland Kitchen Psychiatry: Unable to assess mood due to anoxic brain injury  Data Reviewed: CBC: Recent Labs  Lab 07/06/18 0159 07/08/18 0736  WBC 10.3 8.7  NEUTROABS  --  6.0  HGB 14.4 13.4  HCT 44.1 41.6  MCV 93.0 93.7  PLT 345 235   Basic Metabolic Panel: Recent Labs  Lab 07/02/18 0332 07/06/18 0159 07/08/18 0736  NA 141 145 145  K 4.1 3.9 3.8  CL 100 111 108  CO2 27 26 30   GLUCOSE 107* 125* 125*  BUN 27* 31* 31*  CREATININE 0.68 0.70 0.71  CALCIUM 9.9 9.3 9.1   GFR: Estimated Creatinine Clearance: 154.3 mL/min (by C-G formula based on SCr of 0.71 mg/dL). Liver Function Tests: Recent Labs  Lab 07/08/18 0736  AST 55*  ALT 74*  ALKPHOS 68  BILITOT 0.7  PROT 7.0  ALBUMIN 3.0*   No results for input(s): LIPASE, AMYLASE in the last 168 hours. No results for input(s): AMMONIA in the last 168 hours. Coagulation Profile: Recent Labs  Lab 07/08/18 0736  INR 1.3*   Cardiac Enzymes: No results for input(s): CKTOTAL, CKMB, CKMBINDEX, TROPONINI in the last 168 hours. BNP (last 3 results) No results for input(s): PROBNP in the last 8760 hours. HbA1C: No results for input(s): HGBA1C in the last 72 hours. CBG: Recent Labs  Lab 07/07/18 1611 07/07/18 2125 07/08/18 0043 07/08/18 0436 07/08/18 0735  GLUCAP 117* 83 107* 102* 106*   Lipid Profile: No results for input(s): CHOL, HDL, LDLCALC, TRIG, CHOLHDL, LDLDIRECT in the last 72 hours. Thyroid Function Tests: No results for input(s): TSH, T4TOTAL, FREET4, T3FREE, THYROIDAB in the last 72 hours. Anemia Panel: No results for input(s): VITAMINB12, FOLATE, FERRITIN, TIBC, IRON, RETICCTPCT in the last 72 hours. Urine analysis:    Component Value Date/Time   COLORURINE YELLOW 07/05/2018 1200   APPEARANCEUR HAZY (A) 07/05/2018 1200   LABSPEC 1.032 (H) 07/05/2018 1200   PHURINE 7.0 07/05/2018 1200   GLUCOSEU NEGATIVE 07/05/2018 1200   HGBUR MODERATE (A)  07/05/2018 1200   BILIRUBINUR NEGATIVE 07/05/2018 1200   KETONESUR 5 (A) 07/05/2018 1200   PROTEINUR 30 (A) 07/05/2018 1200   NITRITE NEGATIVE 07/05/2018 1200   LEUKOCYTESUR NEGATIVE 07/05/2018 1200   Sepsis Labs: @LABRCNTIP (procalcitonin:4,lacticidven:4)  ) Recent Results (from the past 240 hour(s))  Culture, Urine     Status: None   Collection Time: 07/05/18  9:41 AM  Result Value Ref Range Status   Specimen Description URINE, RANDOM  Final   Special Requests Normal  Final   Culture   Final    NO GROWTH Performed at [facility], [address], [place], [place] [address]    Report Status 07/06/2018 FINAL  Final  Expectorated sputum assessment w rflx to resp cult     Status: None   Collection Time: 07/05/18  9:41 AM  Result Value Ref Range Status   Specimen Description EXPECTORATED SPUTUM  Final   Special Requests Normal  Final   Sputum evaluation   Final    THIS SPECIMEN IS  ACCEPTABLE FOR SPUTUM CULTURE Performed at [facility], [address] [place], [place] [address]    Report Status 07/05/2018 FINAL  Final  Culture, respiratory     Status: None   Collection Time: 07/05/18  9:41 AM  Result Value Ref Range Status   Specimen Description EXPECTORATED SPUTUM  Final   Special Requests Normal Reflexed from [id number]  Final   Gram Stain   Final    MODERATE WBC PRESENT, PREDOMINANTLY PMN RARE SQUAMOUS EPITHELIAL CELLS PRESENT NO ORGANISMS SEEN Performed at [facility], [address], [place], [place] [address]    Culture FEW CANDIDA ALBICANS  Final   Report Status 07/07/2018 FINAL  Final  Culture, blood (routine x 2)     Status: None (Preliminary result)   Collection Time: 07/05/18 10:00 AM  Result Value Ref Range Status   Specimen Description BLOOD RIGHT HAND  Final   Special Requests   Final    BOTTLES DRAWN AEROBIC AND ANAEROBIC Blood Culture adequate volume   Culture   Final    NO GROWTH 3 DAYS Performed at [facility], [address], [place], [place] [address]    Report Status PENDING  Incomplete  Culture, blood (routine x 2)     Status: None (Preliminary result)   Collection Time: 07/05/18 10:15 AM  Result Value Ref Range Status   Specimen Description BLOOD LEFT HAND  Final   Special Requests   Final    BOTTLES DRAWN AEROBIC AND ANAEROBIC Blood Culture adequate volume   Culture   Final    NO GROWTH 3 DAYS Performed at [facility], [address] [place], [place] [address]    Report Status PENDING  Incomplete  SARS Coronavirus 2 (CEPHEID - Performed in [facility] hospital lab), Hosp Order     Status: None   Collection Time: 07/05/18 10:55 AM  Result Value Ref Range Status   SARS Coronavirus 2 NEGATIVE NEGATIVE Final    Comment: (NOTE) If result is NEGATIVE SARS-CoV-2 target nucleic acids are NOT DETECTED. The SARS-CoV-2 RNA is generally detectable in upper and lower  respiratory specimens during the acute phase of infection. The lowest  concentration of SARS-CoV-2 viral copies this assay can detect is 250  copies / mL. A negative result does not preclude SARS-CoV-2 infection  and should not be used as the sole basis for treatment or other  patient management decisions.  A negative result may occur with  improper specimen collection / handling, submission of specimen other  than nasopharyngeal swab, presence of viral mutation(s) within the  areas targeted by this assay, and inadequate number of viral copies  (<250 copies / mL). A negative result must be combined with clinical  observations, patient history, and epidemiological information. If result is POSITIVE SARS-CoV-2 target nucleic acids are DETECTED. The SARS-CoV-2 RNA is generally detectable in upper and lower  respiratory specimens dur ing the acute phase of infection.  Positive  results are indicative of active infection with SARS-CoV-2.  Clinical  correlation with patient history and other diagnostic information is  necessary to determine  patient infection status.  Positive results do  not rule out bacterial infection or co-infection with other viruses. If result is PRESUMPTIVE POSTIVE SARS-CoV-2 nucleic acids MAY BE PRESENT.   A presumptive positive result was obtained on the submitted specimen  and confirmed on repeat testing.  While 2019 novel coronavirus  (SARS-CoV-2) nucleic acids may be present in the submitted sample  additional confirmatory testing may be necessary for epidemiological  and / or clinical management purposes  to differentiate between  SARS-CoV-2 and other Sarbecovirus currently known to infect humans.  If clinically indicated additional testing with an alternate test  methodology [phone]([id number]) is advised. The SARS-CoV-2 RNA is generally  detectable in upper and lower respiratory sp ecimens during the acute  phase of infection. The expected result is Negative. Fact Sheet for Patients:  BoilerBrush.com.cyhttps://www.fda.gov/media/136312/download Fact Sheet for Healthcare Providers: https://pope.com/https://www.fda.gov/media/136313/download This test is not yet approved or cleared by the [place] FDA and has been authorized for detection and/or diagnosis of SARS-CoV-2 by FDA under an Emergency Use Authorization (EUA).  This EUA will remain in effect (meaning this test can be used) for the duration of the COVID-19 declaration under Section 564(b)(1) of the Act, 21 U.S.C. section 360bbb-3(b)(1), unless the authorization is terminated or revoked sooner. Performed at [facility], [address] [place], [place] [address]   MRSA PCR Screening     Status: None   Collection Time: 07/05/18  1:00 PM  Result Value Ref Range Status   MRSA by PCR NEGATIVE NEGATIVE Final    Comment:        The GeneXpert MRSA Assay (FDA approved for NASAL specimens only), is one component of a comprehensive MRSA colonization surveillance program. It is not intended to diagnose MRSA infection nor to guide or monitor treatment for MRSA infections.  Performed at [facility], [address] [place], [place] [address]       Studies: No results found.  Scheduled Meds: . bethanechol  10 mg Per Tube TID  . chlorhexidine  15 mL Mouth Rinse BID  . citalopram  30 mg Oral Daily  . [START ON 07/09/2018] enoxaparin (LOVENOX) injection  40 mg Subcutaneous Q24H  . feeding supplement (PRO-STAT SUGAR FREE 64)  30 mL Per Tube Daily  . gabapentin  300-600 mg Oral QPM  . levETIRAcetam  500 mg Per Tube BID  . mouth rinse  15 mL Mouth Rinse 10 times per day  . propranolol  40 mg Per Tube Q2000    Continuous Infusions: . sodium chloride 250 mL (07/06/18 1041)  . feeding supplement (OSMOLITE 1.5 CAL) 65 mL/hr at 07/03/18 2357     LOS: 25 days     Darlin Droparole N Eryn Marandola, MD [facility] Pager [phone]  If 7PM-7AM, please contact night-coverage [email or web address] Password [email or web address] 07/08/2018, 11:45 AM

## 2018-07-08 NOTE — Progress Notes (Signed)
  Speech Language Pathology Treatment: [name] Speaking valve;Cognitive-Linquistic  Patient Details Name: Jesse [name] MRN: [id number] DOB: 08-Mar-1990 Today's Date: 07/08/2018 Time: 5284-1324 SLP Time Calculation (min) (ACUTE ONLY): 9 min  Assessment / Plan / Recommendation Clinical Impression  Increased alertness, focus and visual tracking today. Utilized eye blink for communication x 2 with repetition and encouragement with "no" (2 blinks) greater difficulty discerning. Opened oral cavity to allow hygiene with toothbrush. Although RN deep suctioned to remove audible congestion exhaled air is trapped. Pt inhaled and exhaled air not felt on therapist's hand and no visible chest retraction and valve removed. Improvements today and pt able to convey desire via eye blinks today- need to work toward establishing this as reliable response. He does not have adequate head or control of thumb.     HPI HPI: 28 year old male found down in car with suspected drug overdose versus status epilepticus. Intubated 4/20. 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 No significant medical past history but history of alcohol and drug abuse      SLP Plan  Continue with current plan of care       Recommendations         Patient may use Passy-Muir Speech Valve: with SLP only PMSV Supervision: Full MD: Please consider changing trach tube to : Smaller size;Cuffless         General recommendations: Rehab consult Oral Care Recommendations: Oral care QID Follow up Recommendations: Inpatient Rehab SLP Visit Diagnosis: Aphonia (R49.1);Cognitive communication deficit (M01.027) Plan: Continue with current plan of care       GO                 Royce Macadamia 07/08/2018, 11:22 AM  Breck Coons [name].Ed Nurse, children's [phone] Office [phone]

## 2018-07-08 NOTE — Care Management (Signed)
Notified by [facility] that pt has been accepted by medical director for admission to their acute rehab program, pending medical stability, PEG placement, and insurance authorization.  Noted pt did not pass swallow evaluation, and referral has been made to IR for PEG.  This RN CM and rehab admissions coordinator [facility]: [phone]) will touch base on Tuesday AM to discuss medical stability, and to check progress with PEG tube.  IF pt medically stable and PEG placed, facility will start insurance authorization for admission to rehab.   Pt's mother and attending MD updated.    Quintella Baton, RN, BSN  Trauma/Neuro ICU Case Manager [phone]

## 2018-07-09 LAB — GLUCOSE, CAPILLARY
Glucose-Capillary: 108 mg/dL — ABNORMAL HIGH (ref 70–99)
Glucose-Capillary: 91 mg/dL (ref 70–99)
Glucose-Capillary: 108 mg/dL — ABNORMAL HIGH (ref 70–99)
Glucose-Capillary: 108 mg/dL — ABNORMAL HIGH (ref 70–99)
Glucose-Capillary: 103 mg/dL — ABNORMAL HIGH (ref 70–99)

## 2018-07-09 LAB — COMPREHENSIVE METABOLIC PANEL
ALT: 75 U/L — ABNORMAL HIGH (ref 0–44)
AST: 57 U/L — ABNORMAL HIGH (ref 15–41)
Albumin: 3 g/dL — ABNORMAL LOW (ref 3.5–5.0)
Alkaline Phosphatase: 70 U/L (ref 38–126)
Anion gap: 10 (ref 5–15)
BUN: 28 mg/dL — ABNORMAL HIGH (ref 6–20)
CO2: 27 mmol/L (ref 22–32)
Calcium: 9.3 mg/dL (ref 8.9–10.3)
Chloride: 107 mmol/L (ref 98–111)
Creatinine, Ser: 0.65 mg/dL (ref 0.61–1.24)
GFR calc Af Amer: >60 mL/min (ref >60)
GFR calc non Af Amer: >60 mL/min (ref >60)
Glucose, Bld: 117 mg/dL — ABNORMAL HIGH (ref 70–99)
Potassium: 3.7 mmol/L (ref 3.5–5.1)
Sodium: 144 mmol/L (ref 135–145)
Total Bilirubin: 0.7 mg/dL (ref 0.3–1.2)
Total Protein: 6.7 g/dL (ref 6.5–8.1)

## 2018-07-09 LAB — AMYLASE: Amylase: 67 U/L (ref 28–100)

## 2018-07-09 LAB — LIPASE, BLOOD: Lipase: 65 U/L — ABNORMAL HIGH (ref 11–51)

## 2018-07-09 MED ORDER — CHLORHEXIDINE GLUCONATE 0.12 % MT SOLN
15.0000 mL | Freq: Two times a day (BID) | OROMUCOSAL | Status: DC
  Administered 2018-07-09 – 2018-07-14 (×10): 15 mL via OROMUCOSAL
  Filled 2018-07-09 (×7): qty 15

## 2018-07-09 MED ORDER — ORAL CARE MOUTH RINSE
15.0000 mL | Freq: Two times a day (BID) | OROMUCOSAL | Status: DC
  Administered 2018-07-10 – 2018-07-14 (×9): 15 mL via OROMUCOSAL

## 2018-07-09 NOTE — Progress Notes (Addendum)
PROGRESS NOTE  Jesse [name] ZOX:[id number] DOB: 1990-05-02 DOA: 06/13/2018 PCP: No primary care provider on file.  HPI/Recap of past 27 hours: 28 year old male who was found down in his vehicle, unresponsive, suspected postictal. He had several unmarked pills around him. Patient did not respond to naloxone. While at the emergency department he required intubation for airway protection. Upon admission to the intensive care unit he was hypotensive, required vasopressors.  He had a prolonged hospital stay, he was diagnosed with staph aureus and Klebsiella pneumonia. He continued to be encephalopathic, possible anoxic injury. He required tracheostomy April 30, in orderto be liberated from mechanical ventilation.  07/09/18: Patient seen and examined at bedside.  Afebrile overnight.  This morning patient responds to his name and is able to minimally squeeze with his left hand.  Continues to track with his eyes.  Antibiotics stopped yesterday per infectious disease.   Assessment/Plan: Active Problems:   Altered mental status   Acute respiratory failure with hypoxia (HCC)   Status post tracheostomy (HCC)   Drug overdose   Shock (HCC)   Goals of care, counseling/discussion   Palliative care by specialist   AKI (acute kidney injury) (HCC)   Tachypnea   Acute blood loss anemia   Drug addiction (HCC)   Anoxic brain injury (HCC)   Palliative care encounter  Acute hypoxic and hypercapnic respiratory failure due to encephalopathy, possible anoxic brain injury.  Tolerating trach collar well.   Continue trach suctioning CCM for possible trach tube exchange Continue Keppra for seizure prophylaxis Mother has requested transfer to [facility].  Accepted to [facility] Med acute unit on 07/08/18 pending PEG tube placement and medical clearance.  Improving sepsis secondary to Klebsiella ESBL HCAP  Leukocytosis has resolved  Tachycardia improving Afebrile overnight Completed 12 days of meropenem    Blood cultures x2- to date Infectious disease consulted on 07/08/2018  Fever of unknown etiology with suspicion for central fever Off antibiotics since 07/08/2018 per infectious disease Appears to be improving No fever overnight  Elevated AST ALT Mild elevation on labs done yesterday Acute hepatitis panel ordered and pending No acute findings on CT abdomen and pelvis without contrast  Mildly elevated lipase level Lipase on 07/08/18 was 67 No acute abnormalities on CT abdomen and pelvis without contrast  Resolved urinary retention. Condom cath in place with good urine output net I&O -4.2 L since admission  Polysubstance abuse including cocaine UDS done on 06/13/2018+ for cocaine and benzodiazepine  Chronic depression and anxiety: Continue antidepressant Celexa, gabapentin, and Valium as needed  ADHD: Continue to hold off Vyvanse due to concern for lowering seizure threshold  Dysphagia: Failed swallow evaluation by speech therapist.  PEG tube placement by interventional radiology ordered.   DVT prophylaxis: Lovenox subQ Code Status: Full Family Communication:  Called mother on 07/09/2018 for update. Disposition Plan:  Possible transfer to [facility] once PEG tube is placed and patient is medically cleared possibly Tuesday or Wednesday..    Objective: Vitals:   07/09/18 0433 07/09/18 0743 07/09/18 0800 07/09/18 0906  BP:   124/78   Pulse:  91 98 (!) 101  Resp:  Temp:    98.1 F (36.7 C)  TempSrc:    Axillary  SpO2:  97% 96% 96%  Weight: 77.3 kg     Height:        Intake/Output Summary (Last 24 hours) at 07/09/2018 0958 Last data filed at 07/09/2018 0906 Gross per 24 hour  Intake 810 ml  Output 3150 ml  Net -2340 ml   Filed Weights   07/07/18 0500 07/08/18 0500 07/09/18 0433  Weight: 77.2 kg 77.3 kg 77.3 kg    Exam:   General: 28 y.o. year-old male well-developed well-nourished in no acute distress.  Trach collar in place.  Tracks with his eyes.   Responds to his name.  Minimally follows command.  Attempts to squeeze with his left hand when asked.  Cardiovascular: Regular rate and rhythm with no rubs or gallops.  No JVD or thyromegaly noted.  Respiratory: Clear to auscultation with no wheezes or rales.  Poor inspiratory effort.  Abdomen: Soft nontender nondistended with normal bowel sounds.  Musculoskeletal: No lower extremity edema.  2 out of 4 pulses in all 4 extremities.  Psychiatry: Mood appears appropriate for condition and setting.  Data Reviewed: CBC: Recent Labs  Lab 07/06/18 0159 07/08/18 0736  WBC 10.3 8.7  NEUTROABS  --  6.0  HGB 14.4 13.4  HCT 44.1 41.6  MCV 93.0 93.7  PLT 345 235   Basic Metabolic Panel: Recent Labs  Lab 07/06/18 0159 07/08/18 0736 07/09/18 0532  NA 145 145 144  K 3.9 3.8 3.7  CL 111 108 107  CO2 GLUCOSE 125* 125* 117*  BUN 31* 31* 28*  CREATININE 0.70 0.71 0.65  CALCIUM 9.3 9.1 9.3   GFR: Estimated Creatinine Clearance: 154.3 mL/min (by C-G formula based on SCr of 0.65 mg/dL). Liver Function Tests: Recent Labs  Lab 07/08/18 0736 07/09/18 0532  AST 55* 57*  ALT 74* 75*  ALKPHOS 68 70  BILITOT 0.7 0.7  PROT 7.0 6.7  ALBUMIN 3.0* 3.0*   Recent Labs  Lab 07/09/18 0532  LIPASE 65*  AMYLASE 67   No results for input(s): AMMONIA in the last 168 hours. Coagulation Profile: Recent Labs  Lab 07/08/18 0736  INR 1.3*   Cardiac Enzymes: No results for input(s): CKTOTAL, CKMB, CKMBINDEX, TROPONINI in the last 168 hours. BNP (last 3 results) No results for input(s): PROBNP in the last 8760 hours. HbA1C: No results for input(s): HGBA1C in the last 72 hours. CBG: Recent Labs  Lab 07/08/18 1705 07/08/18 2102 07/08/18 2343 07/09/18 0329 07/09/18 0747  GLUCAP 102* 91 94 108* 91   Lipid Profile: No results for input(s): CHOL, HDL, LDLCALC, TRIG, CHOLHDL, LDLDIRECT in the last 72 hours. Thyroid Function Tests: No results for input(s): TSH, T4TOTAL, FREET4,  T3FREE, THYROIDAB in the last 72 hours. Anemia Panel: No results for input(s): VITAMINB12, FOLATE, FERRITIN, TIBC, IRON, RETICCTPCT in the last 72 hours. Urine analysis:    Component Value Date/Time   COLORURINE YELLOW 07/05/2018 1200   APPEARANCEUR HAZY (A) 07/05/2018 1200   LABSPEC 1.032 (H) 07/05/2018 1200   PHURINE 7.0 07/05/2018 1200   GLUCOSEU NEGATIVE 07/05/2018 1200   HGBUR MODERATE (A) 07/05/2018 1200   BILIRUBINUR NEGATIVE 07/05/2018 1200   KETONESUR 5 (A) 07/05/2018 1200   PROTEINUR 30 (A) 07/05/2018 1200   NITRITE NEGATIVE 07/05/2018 1200   LEUKOCYTESUR NEGATIVE 07/05/2018 1200   Sepsis Labs: (procalcitonin:4,lacticidven:4)  ) Recent Results (from the past 240 hour(s))  Culture, Urine     Status: None   Collection Time: 07/05/18  9:41 AM  Result Value Ref Range Status   Specimen Description URINE, RANDOM  Final   Special Requests Normal  Final   Culture   Final    NO GROWTH Performed at [facility] Lab, [address], [place], [place] [address]    Report Status 07/06/2018 FINAL  Final  Expectorated sputum assessment w rflx to resp cult     Status: None   Collection Time: 07/05/18  9:41 AM  Result Value Ref Range Status   Specimen Description EXPECTORATED SPUTUM  Final   Special Requests Normal  Final   Sputum evaluation   Final    THIS SPECIMEN IS ACCEPTABLE FOR SPUTUM CULTURE Performed at [facility] Lab, [address], [place], [place] [address]    Report Status 07/05/2018 FINAL  Final  Culture, respiratory     Status: None   Collection Time: 07/05/18  9:41 AM  Result Value Ref Range Status   Specimen Description EXPECTORATED SPUTUM  Final   Special Requests Normal Reflexed from [phone]  Final   Gram Stain   Final    MODERATE WBC PRESENT, PREDOMINANTLY PMN RARE SQUAMOUS EPITHELIAL CELLS PRESENT NO ORGANISMS SEEN Performed at [facility] Lab, [address] [place], [place] [address]    Culture FEW CANDIDA ALBICANS  Final    Report Status 07/07/2018 FINAL  Final  Culture, blood (routine x 2)     Status: None (Preliminary result)   Collection Time: 07/05/18 10:00 AM  Result Value Ref Range Status   Specimen Description BLOOD RIGHT HAND  Final   Special Requests   Final    BOTTLES DRAWN AEROBIC AND ANAEROBIC Blood Culture adequate volume   Culture   Final    NO GROWTH 4 DAYS Performed at [facility] Lab, [address], [place], [place] [address]    Report Status PENDING  Incomplete  Culture, blood (routine x 2)     Status: None (Preliminary result)   Collection Time: 07/05/18 10:15 AM  Result Value Ref Range Status   Specimen Description BLOOD LEFT HAND  Final   Special Requests   Final    BOTTLES DRAWN AEROBIC AND ANAEROBIC Blood Culture adequate volume   Culture   Final    NO GROWTH 4 DAYS Performed at [facility] Lab, [address], [place], [place] [address]    Report Status PENDING  Incomplete  SARS Coronavirus 2 (CEPHEID - Performed in [facility] hospital lab), Hosp Order     Status: None   Collection Time: 07/05/18 10:55 AM  Result Value Ref Range Status   SARS Coronavirus 2 NEGATIVE NEGATIVE Final    Comment: (NOTE) If result is NEGATIVE SARS-CoV-2 target nucleic acids are NOT DETECTED. The SARS-CoV-2 RNA is generally detectable in upper and lower  respiratory specimens during the acute phase of infection. The lowest  concentration of SARS-CoV-2 viral copies this assay can detect is 250  copies / mL. A negative result does not preclude SARS-CoV-2 infection  and should not be used as the sole basis for treatment or other  patient management decisions.  A negative result may occur with  improper specimen collection / handling, submission of specimen other  than nasopharyngeal swab, presence of viral mutation(s) within the  areas targeted by this assay, and inadequate number of viral copies  (<250 copies / mL). A negative result must be combined with clinical  observations, patient  history, and epidemiological information. If result is POSITIVE SARS-CoV-2 target nucleic acids are DETECTED. The SARS-CoV-2 RNA is generally detectable in upper and lower  respiratory specimens dur ing the acute phase of infection.  Positive  results are indicative of active infection with SARS-CoV-2.  Clinical  correlation with patient history and other diagnostic information is  necessary to determine patient infection status.  Positive results do  not rule out bacterial infection or co-infection with other viruses. If result is PRESUMPTIVE POSTIVE SARS-CoV-2 nucleic acids MAY BE PRESENT.   A presumptive positive result was obtained on the submitted specimen  and confirmed on repeat testing.  While 2019 novel coronavirus  (SARS-CoV-2) nucleic acids may be present in the submitted sample  additional confirmatory testing may be necessary for epidemiological  and / or clinical management purposes  to differentiate between  SARS-CoV-2 and other Sarbecovirus currently known to infect humans.  If clinically indicated additional testing with an alternate test  methodology [phone]) is advised. The SARS-CoV-2 RNA is generally  detectable in upper and lower respiratory sp ecimens during the acute  phase of infection. The expected result is Negative. Fact Sheet for Patients:  BoilerBrush.com.cy Fact Sheet for Healthcare Providers: https://pope.com/ This test is not yet approved or cleared by the [place] FDA and has been authorized for detection and/or diagnosis of SARS-CoV-2 by FDA under an Emergency Use Authorization (EUA).  This EUA will remain in effect (meaning this test can be used) for the duration of the COVID-19 declaration under Section 564(b)(1) of the Act, 21 U.S.C. section 360bbb-3(b)(1), unless the authorization is terminated or revoked sooner. Performed at [facility] Lab, [address] [place], [place] [address]     MRSA PCR Screening     Status: None   Collection Time: 07/05/18  1:00 PM  Result Value Ref Range Status   MRSA by PCR NEGATIVE NEGATIVE Final    Comment:        The GeneXpert MRSA Assay (FDA approved for NASAL specimens only), is one component of a comprehensive MRSA colonization surveillance program. It is not intended to diagnose MRSA infection nor to guide or monitor treatment for MRSA infections. Performed at [facility] Lab, [address], [place], [place] [address]       Studies: Ct Abdomen Wo Contrast  Result Date: 07/08/2018 CLINICAL DATA:  28 year old male referred for gastrostomy tube placement EXAM: CT ABDOMEN WITHOUT CONTRAST TECHNIQUE: Multidetector CT imaging of the abdomen was performed following the standard protocol without IV contrast. COMPARISON:  None. FINDINGS: Lower chest: Likely atelectatic changes at the right lung base. Hepatobiliary: Unremarkable liver.  Unremarkable gallbladder Pancreas: Unremarkable Spleen: Unremarkable Adrenals/Urinary Tract: Unremarkable appearance of the adrenal glands. No evidence of hydronephrosis of the right or left kidney. No nephrolithiasis. Unremarkable course of the bilateral ureters. Unremarkable appearance of the urinary bladder. Stomach/Bowel: Enteric tube traverses the lower mediastinum, and terminates in the stomach near the pylorus. Colon overlies the inferior margin of stomach with no evidence of prior abdominal surgery of the midline abdomen. Fluid filled right colon. Unremarkable visualized small bowel. No abnormally distended small bowel or visualized colon. Vascular/Lymphatic: Unremarkable vasculature.  No adenopathy Other: No abdominal hernia. No surgical changes of the midline abdomen Musculoskeletal: No acute displaced fracture IMPRESSION: No acute finding of the abdomen. Enteric tube terminates near the pylorus. Electronically Signed   By: Gilmer Mor D.O.   On: 07/08/2018 13:20    Scheduled Meds:  bethanechol   10 mg Per Tube TID   chlorhexidine  15 mL Mouth Rinse BID   citalopram  30 mg Oral Daily   enoxaparin (LOVENOX) injection  40 mg Subcutaneous Q24H   feeding supplement (PRO-STAT SUGAR FREE 64)  30 mL Per Tube Daily   gabapentin  300-600 mg Oral QPM   levETIRAcetam  500 mg Per Tube BID   mouth rinse  15 mL Mouth Rinse 10 times per  day   propranolol  40 mg Per Tube Q2000    Continuous Infusions:  sodium chloride 250 mL (07/06/18 1041)   feeding supplement (OSMOLITE 1.5 CAL) 65 mL/hr at 07/03/18 2357     LOS: 26 days     Darlin Droparole N Wilbur [place], MD [facility] Pager [phone]  If 7PM-7AM, please contact night-coverage [email or web address] Password [email or web address] 07/09/2018, 9:58 AM

## 2018-07-09 NOTE — Progress Notes (Signed)
Patient ID: Armari Point, male   DOB: December 06, 1990, 28 y.o.   MRN: [id number]          [facility] for Infectious Disease    Date of Admission:  06/13/2018     Mr. Trautner recently completed 12 days of empiric meropenem for unexplained fevers.  He was afebrile overnight.  Recent blood cultures were negative.  He has no evidence of pneumonia.  CT abdomen and pelvis showed no acute abnormalities.  He has no eosinophilia.  He has mild elevation of his hepatic transaminases.  Serologic studies are pending.  I recommend continued observation off of antibiotics for now.         Cliffton Asters, MD [facility] for Infectious Disease [facility] [phone] pager   [phone] cell 07/09/2018, 12:10 PM

## 2018-07-09 NOTE — Progress Notes (Signed)
Assisted tele visit to patient with fiance.  Shalie Schremp, Loni Beckwith, RN

## 2018-07-09 NOTE — Progress Notes (Signed)
Inpatient Rehabilitation Admissions Coordinator  Noted acceptance by [facility]. I will sign off.  Ottie Glazier, RN, MSN Rehab Admissions Coordinator [phone] 07/09/2018 9:08 AM

## 2018-07-09 NOTE — Progress Notes (Signed)
Assisted with camera/video time with pt's dad

## 2018-07-09 NOTE — Progress Notes (Signed)
IR consulted for percutaneous gastrostomy tube placement. Case reviewed by Dr. Lowella Dandy who notes a poor window for percutaneous approach.  Will plan for percutaneous attempt in IR on Monday after barium administration overnight tomorrow. Will arrange and place appropriate orders for possible placement.   Loyce Dys, MS RD PA-C

## 2018-07-09 NOTE — Progress Notes (Signed)
Assisted tele visit to patient with mother.  Antawn Sison Ann, RN  

## 2018-07-10 LAB — CULTURE, BLOOD (ROUTINE X 2)
Culture: NO GROWTH
Culture: NO GROWTH
Special Requests: ADEQUATE
Special Requests: ADEQUATE

## 2018-07-10 LAB — GLUCOSE, CAPILLARY
Glucose-Capillary: 103 mg/dL — ABNORMAL HIGH (ref 70–99)
Glucose-Capillary: 120 mg/dL — ABNORMAL HIGH (ref 70–99)
Glucose-Capillary: 123 mg/dL — ABNORMAL HIGH (ref 70–99)
Glucose-Capillary: 88 mg/dL (ref 70–99)
Glucose-Capillary: 91 mg/dL (ref 70–99)
Glucose-Capillary: 95 mg/dL (ref 70–99)

## 2018-07-10 MED ORDER — ENOXAPARIN SODIUM 40 MG/0.4ML ~~LOC~~ SOLN
40.0000 mg | SUBCUTANEOUS | Status: DC
  Administered 2018-07-13: 40 mg via SUBCUTANEOUS
  Filled 2018-07-10: qty 0.4

## 2018-07-10 MED ORDER — LEVETIRACETAM IN NACL 500 MG/100ML IV SOLN: 500.0000 mg | Freq: Two times a day (BID) | INTRAVENOUS | Status: DC

## 2018-07-10 MED ORDER — LEVETIRACETAM IN NACL 500 MG/100ML IV SOLN
500.0000 mg | Freq: Two times a day (BID) | INTRAVENOUS | Status: DC
  Administered 2018-07-10 – 2018-07-14 (×8): 500 mg via INTRAVENOUS
  Filled 2018-07-10 (×8): qty 100

## 2018-07-10 NOTE — Progress Notes (Signed)
Patient for possible gastrostomy tube placement in IR tomorrow.  Patient will need barium administration overnight due to small window on CT for percutaneous approach.  This has been ordered.  Discussed with RN.  Spoke with mother over the phone for consent.   Plan for possible procedure in IR tomorrow as schedule allows.   Loyce Dys, MS RD PA-C 11:26 AM

## 2018-07-10 NOTE — Progress Notes (Signed)
Assisted tele visit to patient with mother.  Loukisha Gunnerson Ann, RN  

## 2018-07-10 NOTE — Progress Notes (Signed)
Assisted tele visit to patient with father.  Kayne Yuhas Ann, RN  

## 2018-07-10 NOTE — Progress Notes (Signed)
Patient placed on NPO per order. Tube feeding stop for procedure tomorrow per order. Barium suspension given per order at 1605, after administering all due medication. Will continue to monitor patient's status

## 2018-07-10 NOTE — Progress Notes (Signed)
PROGRESS NOTE  [place] David [name] ZOX:[id number] DOB: [birth year] DOA: 06/13/2018 PCP: No primary care provider on file.  HPI/Recap of past 7424 hours: 28 year old male who was found down in his vehicle, unresponsive, suspected postictal. He had several unmarked pills around him. Patient did not respond to naloxone. While at the emergency department he required intubation for airway protection. Upon admission to the intensive care unit he was hypotensive, required vasopressors.  He had a prolonged hospital stay, he was diagnosed with staph aureus and Klebsiella pneumonia. He continued to be encephalopathic, possible anoxic injury. He required tracheostomy April 30, in orderto be liberated from mechanical ventilation.  07/10/18: Patient seen and examined at his bedside.  No acute events overnight.  He is deeply asleep with no acute distress.  Vital signs and labs have been reviewed and are stable.  Has remained afebrile in the past 48 hours off antibiotics.  Interventional radiology following, possible PEG tube placement tomorrow.    Assessment/Plan: Active Problems:   Altered mental status   Acute respiratory failure with hypoxia (HCC)   Status post tracheostomy (HCC)   Drug overdose   Shock (HCC)   Goals of care, counseling/discussion   Palliative care by specialist   AKI (acute kidney injury) (HCC)   Tachypnea   Acute blood loss anemia   Drug addiction (HCC)   Anoxic brain injury (HCC)   Palliative care encounter  Acute hypoxic and hypercapnic respiratory failure due to encephalopathy, possible anoxic brain injury.  Tolerating trach collar well.   Continue trach suctioning as needed Continue Keppra for seizure prophylaxis Mother [facility]  [facility] on 07/08/18 pending PEG tube placement and medical clearance. Has remained afebrile in the last 48 hours off antibiotics O2 saturation stable on trach collar  Resolving sepsis  secondary to Klebsiella ESBL HCAP  Leukocytosis, fever, tachycardia, tachypnea have resolved  Completed 12 days of meropenem  Blood cultures x2- to date No signs of active infective process Independently viewed vital signs and labs and are stable Patient is medically cleared at this time for transfer to [facility] acute unit neuro rehab.  PEG tube placement has been requested and is pending.  Plan for placement by interventional radiology on Monday, 07/11/2018.  Resolved fever of unknown etiology with suspicion for central fever Off antibiotics since 07/08/2018 per infectious disease Afebrile in the past 48 hours  Elevated AST ALT Mild elevation on labs done yesterday Acute hepatitis panel ordered and pending No acute findings on CT abdomen and pelvis without contrast  Mildly elevated lipase level Lipase on 07/08/18 was 67 No acute abnormalities on CT abdomen and pelvis without contrast No tenderness on palpation of abdomen on physical exam  Resolved urinary retention. Condom cath in place with good urine output net I&O -9.0 L since admission Currently on bethanechol 10 mg 3 times daily Greater than 2300 cc of urine output recorded in the last 24 hours  Polysubstance abuse including cocaine UDS done on 06/13/2018+ for cocaine and benzodiazepine  Chronic depression and anxiety: Continue antidepressant Celexa, gabapentin, and Valium as needed  ADHD: Continue to hold off Vyvanse due to concern for lowering seizure threshold  Dysphagia: Failed swallow evaluation by speech therapist. PEG tube placement by interventional radiology plan on 07/11/2018.   DVT prophylaxis: Lovenox subQ Code Status: Full Family Communication:  Called mother on 07/09/2018 for update. Disposition Plan:  [facility] once PEG tube is placed possibly Tuesday or Wednesday.  At this time  patient is medically cleared for transfer.    Objective: Vitals:   07/10/18 0745 07/10/18 0800 07/10/18 1123  07/10/18 1158  BP: 116/70   121/74  Pulse: 93 97 (!) 104 96  Resp: Temp: 99.4 F (37.4 C)   98.8 F (37.1 C)  TempSrc: Oral   Axillary  SpO2: 96% 96% 97% 96%  Weight:      Height:        Intake/Output Summary (Last 24 hours) at 07/10/2018 1232 Last data filed at 07/09/2018 2000 Gross per 24 hour  Intake 520 ml  Output 950 ml  Net -430 ml   Filed Weights   07/08/18 0500 07/09/18 0433 07/10/18 0500  Weight: 77.3 kg 77.3 kg 77.7 kg    Exam:  . General: 28 y.o. year-old male well-developed well-nourished in no acute distress.  Somnolent. . Cardiovascular: Regular rate and rhythm with no rubs or gallops.  No JVD or thyromegaly. Marland Kitchen Respiratory: Clear to Auscultation with No Wheezes or Rales.  Poor Inspiratory Effort. . Abdomen: Soft nontender nondistended with normal bowel sounds X4 quadrants . Musculoskeletal: No lower extremity edema.  2 out of 4 pulses in all 4 extremities. Marland Kitchen Psychiatry: Unable to assess mood due to somnolence.  Data Reviewed: CBC: Recent Labs  Lab 07/06/18 0159 07/08/18 0736  WBC 10.3 8.7  NEUTROABS  --  6.0  HGB 14.4 13.4  HCT 44.1 41.6  MCV 93.0 93.7  PLT 345 235   Basic Metabolic Panel: Recent Labs  Lab 07/06/18 0159 07/08/18 0736 07/09/18 0532  NA 145 145 144  K 3.9 3.8 3.7  CL 111 108 107  CO2 GLUCOSE 125* 125* 117*  BUN 31* 31* 28*  CREATININE 0.70 0.71 0.65  CALCIUM 9.3 9.1 9.3   GFR: Estimated Creatinine Clearance: 154.9 mL/min (by C-G formula based on SCr of 0.65 mg/dL). Liver Function Tests: Recent Labs  Lab 07/08/18 0736 07/09/18 0532  AST 55* 57*  ALT 74* 75*  ALKPHOS 68 70  BILITOT 0.7 0.7  PROT 7.0 6.7  ALBUMIN 3.0* 3.0*   Recent Labs  Lab 07/09/18 0532  LIPASE 65*  AMYLASE 67   No results for input(s): AMMONIA in the last 168 hours. Coagulation Profile: Recent Labs  Lab 07/08/18 0736  INR 1.3*   Cardiac Enzymes: No results for input(s): CKTOTAL, CKMB, CKMBINDEX, TROPONINI in the  last 168 hours. BNP (last 3 results) No results for input(s): PROBNP in the last 8760 hours. HbA1C: No results for input(s): HGBA1C in the last 72 hours. CBG: Recent Labs  Lab 07/09/18 1619 07/09/18 2008 07/10/18 0028 07/10/18 0315 07/10/18 0717  GLUCAP 108* 103* 95 103* 120*   Lipid Profile: No results for input(s): CHOL, HDL, LDLCALC, TRIG, CHOLHDL, LDLDIRECT in the last 72 hours. Thyroid Function Tests: No results for input(s): TSH, T4TOTAL, FREET4, T3FREE, THYROIDAB in the last 72 hours. Anemia Panel: No results for input(s): VITAMINB12, FOLATE, FERRITIN, TIBC, IRON, RETICCTPCT in the last 72 hours. Urine analysis:    Component Value Date/Time   COLORURINE YELLOW 07/05/2018 1200   APPEARANCEUR HAZY (A) 07/05/2018 1200   LABSPEC 1.032 (H) 07/05/2018 1200   PHURINE 7.0 07/05/2018 1200   GLUCOSEU NEGATIVE 07/05/2018 1200   HGBUR MODERATE (A) 07/05/2018 1200   BILIRUBINUR NEGATIVE 07/05/2018 1200   KETONESUR 5 (A) 07/05/2018 1200   PROTEINUR 30 (A) 07/05/2018 1200   NITRITE NEGATIVE 07/05/2018 1200   LEUKOCYTESUR NEGATIVE 07/05/2018 1200   Sepsis Labs: (procalcitonin:4,lacticidven:4)  )  Recent Results (from the past 240 hour(s))  Culture, Urine     Status: None   Collection Time: 07/05/18  9:41 AM  Result Value Ref Range Status   Specimen Description URINE, RANDOM  Final   Special Requests Normal  Final   Culture   Final    NO GROWTH Performed at [facility], [address], [place], [place] [address]    Report Status 07/06/2018 FINAL  Final  Expectorated sputum assessment w rflx to resp cult     Status: None   Collection Time: 07/05/18  9:41 AM  Result Value Ref Range Status   Specimen Description EXPECTORATED SPUTUM  Final   Special Requests Normal  Final   Sputum evaluation   Final    THIS SPECIMEN IS ACCEPTABLE FOR SPUTUM CULTURE Performed at [facility], [address], [place], [place] [address]    Report Status 07/05/2018  FINAL  Final  Culture, respiratory     Status: None   Collection Time: 07/05/18  9:41 AM  Result Value Ref Range Status   Specimen Description EXPECTORATED SPUTUM  Final   Special Requests Normal Reflexed from [id number]  Final   Gram Stain   Final    MODERATE WBC PRESENT, PREDOMINANTLY PMN RARE SQUAMOUS EPITHELIAL CELLS PRESENT NO ORGANISMS SEEN Performed at [facility], [address], [place], [place] [address]    Culture FEW CANDIDA ALBICANS  Final   Report Status 07/07/2018 FINAL  Final  Culture, blood (routine x 2)     Status: None (Preliminary result)   Collection Time: 07/05/18 10:00 AM  Result Value Ref Range Status   Specimen Description BLOOD RIGHT HAND  Final   Special Requests   Final    BOTTLES DRAWN AEROBIC AND ANAEROBIC Blood Culture adequate volume   Culture   Final    NO GROWTH 4 DAYS Performed at [facility], [address] [place], [place] [address]    Report Status PENDING  Incomplete  Culture, blood (routine x 2)     Status: None (Preliminary result)   Collection Time: 07/05/18 10:15 AM  Result Value Ref Range Status   Specimen Description BLOOD LEFT HAND  Final   Special Requests   Final    BOTTLES DRAWN AEROBIC AND ANAEROBIC Blood Culture adequate volume   Culture   Final    NO GROWTH 4 DAYS Performed at [facility], [address], [place], [place] [address]    Report Status PENDING  Incomplete  SARS Coronavirus 2 (CEPHEID - Performed in [facility] hospital lab), Hosp Order     Status: None   Collection Time: 07/05/18 10:55 AM  Result Value Ref Range Status   SARS Coronavirus 2 NEGATIVE NEGATIVE Final    Comment: (NOTE) If result is NEGATIVE SARS-CoV-2 target nucleic acids are NOT DETECTED. The SARS-CoV-2 RNA is generally detectable in upper and lower  respiratory specimens during the acute phase of infection. The lowest  concentration of SARS-CoV-2 viral copies this assay can detect is 250  copies / mL. A negative result does  not preclude SARS-CoV-2 infection  and should not be used as the sole basis for treatment or other  patient management decisions.  A negative result may occur with  improper specimen collection / handling, submission of specimen other  than nasopharyngeal swab, presence of viral mutation(s) within the  areas targeted by this assay, and inadequate number of viral copies  (<250 copies / mL). A  negative result must be combined with clinical  observations, patient history, and epidemiological information. If result is POSITIVE SARS-CoV-2 target nucleic acids are DETECTED. The SARS-CoV-2 RNA is generally detectable in upper and lower  respiratory specimens dur ing the acute phase of infection.  Positive  results are indicative of active infection with SARS-CoV-2.  Clinical  correlation with patient history and other diagnostic information is  necessary to determine patient infection status.  Positive results do  not rule out bacterial infection or co-infection with other viruses. If result is PRESUMPTIVE POSTIVE SARS-CoV-2 nucleic acids MAY BE PRESENT.   A presumptive positive result was obtained on the submitted specimen  and confirmed on repeat testing.  While 2019 novel coronavirus  (SARS-CoV-2) nucleic acids may be present in the submitted sample  additional confirmatory testing may be necessary for epidemiological  and / or clinical management purposes  to differentiate between  SARS-CoV-2 and other Sarbecovirus currently known to infect humans.  If clinically indicated additional testing with an alternate test  methodology [phone]) is advised. The SARS-CoV-2 RNA is generally  detectable in upper and lower respiratory sp ecimens during the acute  phase of infection. The expected result is Negative. Fact Sheet for Patients:  BoilerBrush.com.cy Fact Sheet for Healthcare Providers: https://pope.com/ This test is not yet approved or  cleared by the [place] FDA and has been authorized for detection and/or diagnosis of SARS-CoV-2 by FDA under an Emergency Use Authorization (EUA).  This EUA will remain in effect (meaning this test can be used) for the duration of the COVID-19 declaration under Section 564(b)(1) of the Act, 21 U.S.C. section 360bbb-3(b)(1), unless the authorization is terminated or revoked sooner. Performed at [facility], [address], [place], [place] [address]   MRSA PCR Screening     Status: None   Collection Time: 07/05/18  1:00 PM  Result Value Ref Range Status   MRSA by PCR NEGATIVE NEGATIVE Final    Comment:        The GeneXpert MRSA Assay (FDA approved for NASAL specimens only), is one component of a comprehensive MRSA colonization surveillance program. It is not intended to diagnose MRSA infection nor to guide or monitor treatment for MRSA infections. Performed at [facility], [address], [place], [place] [address]       Studies: No results found.  Scheduled Meds: . bethanechol  10 mg Per Tube TID  . chlorhexidine  15 mL Mouth Rinse BID  . citalopram  30 mg Oral Daily  . [START ON 07/13/2018] enoxaparin (LOVENOX) injection  40 mg Subcutaneous Q24H  . feeding supplement (PRO-STAT SUGAR FREE 64)  30 mL Per Tube Daily  . gabapentin  300-600 mg Oral QPM  . levETIRAcetam  500 mg Per Tube BID  . mouth rinse  15 mL Mouth Rinse q12n4p  . propranolol  40 mg Per Tube Q2000    Continuous Infusions: . sodium chloride 250 mL (07/06/18 1041)  . feeding supplement (OSMOLITE 1.5 CAL) 1,000 mL (07/10/18 0529)     LOS: 27 days     Darlin Drop, MD [facility] Pager [phone]  If 7PM-7AM, please contact night-coverage [email or web address] Password [facility] 07/10/2018, 12:32 PM

## 2018-07-10 NOTE — Progress Notes (Signed)
Updated mother Larita Fife on the phone.  All questions answered to her satisfaction.

## 2018-07-11 ENCOUNTER — Inpatient Hospital Stay (HOSPITAL_COMMUNITY): Payer: BLUE CROSS/BLUE SHIELD

## 2018-07-11 ENCOUNTER — Encounter (HOSPITAL_COMMUNITY): Payer: Self-pay | Admitting: Diagnostic Radiology

## 2018-07-11 DIAGNOSIS — Z915 Personal history of self-harm: Secondary | ICD-10-CM

## 2018-07-11 DIAGNOSIS — Z8701 Personal history of pneumonia (recurrent): Secondary | ICD-10-CM

## 2018-07-11 HISTORY — PX: IR GASTROSTOMY TUBE MOD SED: IMG625

## 2018-07-11 LAB — CBC WITH DIFFERENTIAL/PLATELET
Abs Immature Granulocytes: 0.02 10*3/uL (ref 0.00–0.07)
Basophils Absolute: 0.1 10*3/uL (ref 0.0–0.1)
Basophils Relative: 1 %
Eosinophils Absolute: 0.3 10*3/uL (ref 0.0–0.5)
Eosinophils Relative: 3 %
HCT: 46.8 % (ref 39.0–52.0)
Hemoglobin: 15.9 g/dL (ref 13.0–17.0)
Immature Granulocytes: 0 %
Lymphocytes Relative: 25 %
Lymphs Abs: 2.5 10*3/uL (ref 0.7–4.0)
MCH: 30.6 pg (ref 26.0–34.0)
MCHC: 34 g/dL (ref 30.0–36.0)
MCV: 90.2 fL (ref 80.0–100.0)
Monocytes Absolute: 0.6 10*3/uL (ref 0.1–1.0)
Monocytes Relative: 7 %
Neutro Abs: 6.4 10*3/uL (ref 1.7–7.7)
Neutrophils Relative %: 64 %
Platelets: 247 10*3/uL (ref 150–400)
RBC: 5.19 MIL/uL (ref 4.22–5.81)
RDW: 12.1 % (ref 11.5–15.5)
WBC: 9.9 10*3/uL (ref 4.0–10.5)
nRBC: 0 % (ref 0.0–0.2)

## 2018-07-11 LAB — COMPREHENSIVE METABOLIC PANEL
ALT: 67 U/L — ABNORMAL HIGH (ref 0–44)
AST: 52 U/L — ABNORMAL HIGH (ref 15–41)
Albumin: 3.5 g/dL (ref 3.5–5.0)
Alkaline Phosphatase: 69 U/L (ref 38–126)
Anion gap: 12 (ref 5–15)
BUN: 32 mg/dL — ABNORMAL HIGH (ref 6–20)
CO2: 23 mmol/L (ref 22–32)
Calcium: 10 mg/dL (ref 8.9–10.3)
Chloride: 108 mmol/L (ref 98–111)
Creatinine, Ser: 0.78 mg/dL (ref 0.61–1.24)
GFR calc Af Amer: >60 mL/min (ref >60)
GFR calc non Af Amer: >60 mL/min (ref >60)
Glucose, Bld: 94 mg/dL (ref 70–99)
Potassium: 4.5 mmol/L (ref 3.5–5.1)
Sodium: 143 mmol/L (ref 135–145)
Total Bilirubin: 1.2 mg/dL (ref 0.3–1.2)
Total Protein: 7.8 g/dL (ref 6.5–8.1)

## 2018-07-11 LAB — GLUCOSE, CAPILLARY
Glucose-Capillary: 80 mg/dL (ref 70–99)
Glucose-Capillary: 81 mg/dL (ref 70–99)
Glucose-Capillary: 84 mg/dL (ref 70–99)
Glucose-Capillary: 88 mg/dL (ref 70–99)
Glucose-Capillary: 89 mg/dL (ref 70–99)
Glucose-Capillary: 91 mg/dL (ref 70–99)

## 2018-07-11 LAB — HEPATITIS PANEL, ACUTE
HCV Ab: 0.1 s/co ratio (ref 0.0–0.9)
Hep A IgM: NEGATIVE
Hep B C IgM: NEGATIVE
Hepatitis B Surface Ag: NEGATIVE

## 2018-07-11 LAB — CMV IGM: CMV IgM: <30 AU/mL (ref 0.0–29.9)

## 2018-07-11 LAB — EBV AB TO VIRAL CAPSID AG PNL, IGG+IGM
EBV VCA IgG: 35.7 U/mL — ABNORMAL HIGH (ref 0.0–17.9)
EBV VCA IgM: <36 U/mL (ref 0.0–35.9)

## 2018-07-11 LAB — LIPASE, BLOOD: Lipase: 71 U/L — ABNORMAL HIGH (ref 11–51)

## 2018-07-11 MED ORDER — MIDAZOLAM HCL 2 MG/2ML IJ SOLN
INTRAMUSCULAR | Status: AC | PRN
  Administered 2018-07-11: 1 mg via INTRAVENOUS
  Administered 2018-07-11 (×2): 0.5 mg via INTRAVENOUS

## 2018-07-11 MED ORDER — IOHEXOL 300 MG/ML  SOLN
50.0000 mL | Freq: Once | INTRAMUSCULAR | Status: AC | PRN
  Administered 2018-07-11: 25 mL via ORAL

## 2018-07-11 MED ORDER — FENTANYL CITRATE (PF) 100 MCG/2ML IJ SOLN
INTRAMUSCULAR | Status: AC
  Filled 2018-07-11: qty 2

## 2018-07-11 MED ORDER — FENTANYL CITRATE (PF) 100 MCG/2ML IJ SOLN
INTRAMUSCULAR | Status: AC | PRN
  Administered 2018-07-11: 50 ug via INTRAVENOUS
  Administered 2018-07-11 (×2): 25 ug via INTRAVENOUS

## 2018-07-11 MED ORDER — LIDOCAINE HCL 1 % IJ SOLN
INTRAMUSCULAR | Status: AC
  Filled 2018-07-11: qty 20

## 2018-07-11 MED ORDER — LIDOCAINE HCL 1 % IJ SOLN
INTRAMUSCULAR | Status: AC | PRN
  Administered 2018-07-11: 10 mL

## 2018-07-11 MED ORDER — CEFAZOLIN SODIUM-DEXTROSE 2-4 GM/100ML-% IV SOLN
INTRAVENOUS | Status: AC
  Administered 2018-07-11: 14:00:00 2000 mg
  Filled 2018-07-11: qty 100

## 2018-07-11 MED ORDER — MIDAZOLAM HCL 2 MG/2ML IJ SOLN
INTRAMUSCULAR | Status: AC
  Filled 2018-07-11: qty 2

## 2018-07-11 NOTE — Progress Notes (Signed)
PROGRESS NOTE  Jesse Jesse [name] NWG:[id number] DOB: 1990-05-14 DOA: 06/13/2018 PCP: No primary care provider on file.  HPI/Recap of past 25 hours: 28 year old Jesse [name] who was found down in his vehicle, unresponsive, suspected postictal. He had several unmarked pills around him. Patient did not respond to naloxone. While at the emergency department he required intubation for airway protection. Upon admission to the intensive care unit he was hypotensive, required vasopressors.  He had a prolonged hospital stay, he was diagnosed with staph aureus and Klebsiella pneumonia. He continued to be encephalopathic, possible anoxic injury. He required tracheostomy April 30, in orderto be liberated from mechanical ventilation.  07/11/18: Patient seen and examined at his bedside.  No acute events overnight.  Afebrile.  Per bedside RN patient raised his right arm to reach his trach collar.  Patient responded to command this morning by blinking his eyes, witnessed by the writer.     Assessment/Plan: Active Problems:   Altered mental status   Acute respiratory failure with hypoxia (HCC)   Status post tracheostomy (HCC)   Drug overdose   Shock (HCC)   Goals of care, counseling/discussion   Palliative care by specialist   AKI (acute kidney injury) (HCC)   Tachypnea   Acute blood loss anemia   Drug addiction (HCC)   Anoxic brain injury (HCC)   Palliative care encounter  Acute hypoxic and hypercapnic respiratory failure due to encephalopathy, possible anoxic brain injury.  Tolerating trach collar well.   Continue trach suctioning as needed Continue Keppra for seizure prophylaxis Mother has requested transfer to [facility].  Accepted to [facility] acute unit on 07/08/18 pending PEG tube placement and medical clearance. Has remained afebrile in the last 72 hours off antibiotics O2 saturation stable on trach collar  Resolving sepsis secondary to Klebsiella ESBL HCAP  Leukocytosis, fever,  tachycardia, tachypnea have resolved  Completed 12 days of meropenem  Blood cultures x2- to date No signs of active infective process Independently viewed vital signs and labs and are stable Patient is medically cleared at this time for transfer to [facility] acute unit neuro rehab.  PEG tube placement has been requested and is pending.  Plan for placement by interventional radiology on Monday, 07/11/2018.  Resolved fever of unknown etiology with suspicion for central fever Off antibiotics since 07/08/2018 per infectious disease Afebrile in the past 72 hours  Elevated AST ALT Mild elevation of AST and ALT are trending down Alkaline phosphatase and bilirubin are normal Acute hepatitis panel pending No acute findings on CT abdomen and pelvis without contrast done on 07/08/2018  Mildly elevated lipase level Lipase on 07/08/18 was 71 Asymptomatic with no tenderness on palpation on exam No acute abnormalities on CT abdomen and pelvis without contrast done on 07/08/2018  Resolved urinary retention. Condom cath in place with good urine output Currently on bethanechol 10 mg 3 times daily Net I&O -10.7 L since admission  Polysubstance abuse including cocaine UDS done on 06/13/2018+ for cocaine and benzodiazepine  Chronic depression and anxiety: Continue antidepressant Celexa, gabapentin, and Valium as needed  ADHD: Continue to hold off Vyvanse due to concern for lowering seizure threshold  Dysphagia: Failed swallow evaluation by speech therapist. PEG tube placement by interventional radiology plan on 07/11/2018.   DVT prophylaxis: Lovenox subQ Code Status: Full Family Communication:  Called mother on 07/10/2018 to give updates. Disposition Plan:  Possible transfer to [facility] once PEG tube is placed possibly Tuesday or Wednesday.  At this time patient is medically cleared for transfer.  Objective: Vitals:   07/11/18 0800 07/11/18 0842 07/11/18 1200 07/11/18 1212  BP: 118/83 118/83  112/86 117/76  Pulse:  (!) 103 (!) 106 90  Resp: 13 14 15 12   Temp:      TempSrc:      SpO2:  94% 94% 94%  Weight:      Height:        Intake/Output Summary (Last 24 hours) at 07/11/2018 1325 Last data filed at 07/11/2018 0442 Gross per 24 hour  Intake 100 ml  Output 1150 ml  Net -1050 ml   Filed Weights   07/09/18 0433 07/10/18 0500 07/11/18 0500  Weight: 77.3 kg 77.7 kg 77.7 kg    Exam:  . General: 28 y.o. year-old Jesse [name] developed well-nourished in no acute distress.  Trach collar in place.  Alert tracking with his eyes and following commands. . Cardiovascular: Regular rate and rhythm with no rubs or gallops.  No JVD or thyromegaly noted.   Marland Kitchen Respiratory: Clear to auscultation with no wheezes or rales.  Poor inspiratory effort. . Abdomen: Soft nontender nondistended with normal bowel sounds x4 quadrants.   . Musculoskeletal: No lower extremity edema.  2 out of 4 pulses in all 4 extremities. Marland Kitchen Psychiatry: Mood is appropriate for condition and setting.  Data Reviewed: CBC: Recent Labs  Lab 07/06/18 0159 07/08/18 0736 07/11/18 0717  WBC 10.3 8.7 9.9  NEUTROABS  --  6.0 6.4  HGB 14.4 13.4 15.9  HCT 44.1 41.6 46.8  MCV 93.0 93.7 90.2  PLT 345 235 247   Basic Metabolic Panel: Recent Labs  Lab 07/06/18 0159 07/08/18 0736 07/09/18 0532 07/11/18 0717  NA 145 145 144 143  K 3.9 3.8 3.7 4.5  CL 111 108 107 108  CO2 26 30 27 23   GLUCOSE 125* 125* 117* 94  BUN 31* 31* 28* 32*  CREATININE 0.70 0.71 0.65 0.78  CALCIUM 9.3 9.1 9.3 10.0   GFR: Estimated Creatinine Clearance: 154.9 mL/min (by C-G formula based on SCr of 0.78 mg/dL). Liver Function Tests: Recent Labs  Lab 07/08/18 0736 07/09/18 0532 07/11/18 0717  AST 55* 57* 52*  ALT 74* 75* 67*  ALKPHOS 68 70 69  BILITOT 0.7 0.7 1.2  PROT 7.0 6.7 7.8  ALBUMIN 3.0* 3.0* 3.5   Recent Labs  Lab 07/09/18 0532 07/11/18 0717  LIPASE 65* 71*  AMYLASE 67  --    No results for input(s): AMMONIA in the last 168  hours. Coagulation Profile: Recent Labs  Lab 07/08/18 0736  INR 1.3*   Cardiac Enzymes: No results for input(s): CKTOTAL, CKMB, CKMBINDEX, TROPONINI in the last 168 hours. BNP (last 3 results) No results for input(s): PROBNP in the last 8760 hours. HbA1C: No results for input(s): HGBA1C in the last 72 hours. CBG: Recent Labs  Lab 07/10/18 1955 07/10/18 2321 07/11/18 0430 07/11/18 0731 07/11/18 1158  GLUCAP Jesse 91 91 Jesse 89   Lipid Profile: No results for input(s): CHOL, HDL, LDLCALC, TRIG, CHOLHDL, LDLDIRECT in the last 72 hours. Thyroid Function Tests: No results for input(s): TSH, T4TOTAL, FREET4, T3FREE, THYROIDAB in the last 72 hours. Anemia Panel: No results for input(s): VITAMINB12, FOLATE, FERRITIN, TIBC, IRON, RETICCTPCT in the last 72 hours. Urine analysis:    Component Value Date/Time   COLORURINE YELLOW 07/05/2018 1200   APPEARANCEUR HAZY (A) 07/05/2018 1200   LABSPEC 1.032 (H) 07/05/2018 1200   PHURINE 7.0 07/05/2018 1200   GLUCOSEU NEGATIVE 07/05/2018 1200   HGBUR MODERATE (A) 07/05/2018 1200   BILIRUBINUR  NEGATIVE 07/05/2018 1200   KETONESUR 5 (A) 07/05/2018 1200   PROTEINUR 30 (A) 07/05/2018 1200   NITRITE NEGATIVE 07/05/2018 1200   LEUKOCYTESUR NEGATIVE 07/05/2018 1200   Sepsis Labs: @LABRCNTIP (procalcitonin:4,lacticidven:4)  ) Recent Results (from the past 240 hour(s))  Culture, Urine     Status: None   Collection Time: 07/05/18  9:41 AM  Result Value Ref Range Status   Specimen Description URINE, RANDOM  Final   Special Requests Normal  Final   Culture   Final    NO GROWTH Performed at [facility], [address] [place], [place] [address]    Report Status 07/06/2018 FINAL  Final  Expectorated sputum assessment w rflx to resp cult     Status: None   Collection Time: 07/05/18  9:41 AM  Result Value Ref Range Status   Specimen Description EXPECTORATED SPUTUM  Final   Special Requests Normal  Final   Sputum evaluation   Final     THIS SPECIMEN IS ACCEPTABLE FOR SPUTUM CULTURE Performed at [facility], [address] [place], [place] [address]    Report Status 07/05/2018 FINAL  Final  Culture, respiratory     Status: None   Collection Time: 07/05/18  9:41 AM  Result Value Ref Range Status   Specimen Description EXPECTORATED SPUTUM  Final   Special Requests Normal Reflexed from [id number]  Final   Gram Stain   Final    MODERATE WBC PRESENT, PREDOMINANTLY PMN RARE SQUAMOUS EPITHELIAL CELLS PRESENT NO ORGANISMS SEEN Performed at [facility], [address] [place], [place] [address]    Culture FEW CANDIDA ALBICANS  Final   Report Status 07/07/2018 FINAL  Final  Culture, blood (routine x 2)     Status: None   Collection Time: 07/05/18 10:00 AM  Result Value Ref Range Status   Specimen Description BLOOD RIGHT HAND  Final   Special Requests   Final    BOTTLES DRAWN AEROBIC AND ANAEROBIC Blood Culture adequate volume   Culture   Final    NO GROWTH 5 DAYS Performed at [facility], [address] [place], [place] [address]    Report Status 07/10/2018 FINAL  Final  Culture, blood (routine x 2)     Status: None   Collection Time: 07/05/18 10:15 AM  Result Value Ref Range Status   Specimen Description BLOOD LEFT HAND  Final   Special Requests   Final    BOTTLES DRAWN AEROBIC AND ANAEROBIC Blood Culture adequate volume   Culture   Final    NO GROWTH 5 DAYS Performed at [facility], [address] [place], [place] [address]    Report Status 07/10/2018 FINAL  Final  SARS Coronavirus 2 (CEPHEID - Performed in [facility] hospital lab), Hosp Order     Status: None   Collection Time: 07/05/18 10:55 AM  Result Value Ref Range Status   SARS Coronavirus 2 NEGATIVE NEGATIVE Final    Comment: (NOTE) If result is NEGATIVE SARS-CoV-2 target nucleic acids are NOT DETECTED. The SARS-CoV-2 RNA is generally detectable in upper and lower  respiratory specimens during the acute phase of infection.  The lowest  concentration of SARS-CoV-2 viral copies this assay can detect is 250  copies / mL. A negative result does not preclude SARS-CoV-2 infection  and should not be used as the sole basis for treatment or other  patient management decisions.  A negative result may occur with  improper specimen collection /  handling, submission of specimen other  than nasopharyngeal swab, presence of viral mutation(s) within the  areas targeted by this assay, and inadequate number of viral copies  (<250 copies / mL). A negative result must be combined with clinical  observations, patient history, and epidemiological information. If result is POSITIVE SARS-CoV-2 target nucleic acids are DETECTED. The SARS-CoV-2 RNA is generally detectable in upper and lower  respiratory specimens dur ing the acute phase of infection.  Positive  results are indicative of active infection with SARS-CoV-2.  Clinical  correlation with patient history and other diagnostic information is  necessary to determine patient infection status.  Positive results do  not rule out bacterial infection or co-infection with other viruses. If result is PRESUMPTIVE POSTIVE SARS-CoV-2 nucleic acids MAY BE PRESENT.   A presumptive positive result was obtained on the submitted specimen  and confirmed on repeat testing.  While 2019 novel coronavirus  (SARS-CoV-2) nucleic acids may be present in the submitted sample  additional confirmatory testing may be necessary for epidemiological  and / or clinical management purposes  to differentiate between  SARS-CoV-2 and other Sarbecovirus currently known to infect humans.  If clinically indicated additional testing with an alternate test  methodology [id number]) is advised. The SARS-CoV-2 RNA is generally  detectable in upper and lower respiratory sp ecimens during the acute  phase of infection. The expected result is Negative. Fact Sheet for Patients:  BoilerBrush.com.cy  Fact Sheet for Healthcare Providers: https://pope.com/ This test is not yet approved or cleared by the [place] FDA and has been authorized for detection and/or diagnosis of SARS-CoV-2 by FDA under an Emergency Use Authorization (EUA).  This EUA will remain in effect (meaning this test can be used) for the duration of the COVID-19 declaration under Section 564(b)(1) of the Act, 21 U.S.C. section 360bbb-3(b)(1), unless the authorization is terminated or revoked sooner. Performed at [facility], [address], [place], [place] [address]   MRSA PCR Screening     Status: None   Collection Time: 07/05/18  1:00 PM  Result Value Ref Range Status   MRSA by PCR NEGATIVE NEGATIVE Final    Comment:        The GeneXpert MRSA Assay (FDA approved for NASAL specimens only), is one component of a comprehensive MRSA colonization surveillance program. It is not intended to diagnose MRSA infection nor to guide or monitor treatment for MRSA infections. Performed at [facility], [address] [place], [place] [address]       Studies: Dg Abd Portable 1v  Result Date: 07/11/2018 CLINICAL DATA:  For possible gastrostomy tube placement EXAM: PORTABLE ABDOMEN - 1 VIEW COMPARISON:  None. FINDINGS: Contrast material noted throughout the colon which is separate and inferior from the stomach. Feeding tube tip is in the distal stomach or proximal duodenum. No evidence of bowel obstruction, organomegaly or free air. IMPRESSION: No acute findings. Electronically Signed   By: Charlett Nose M.D.   On: 07/11/2018 08:38    Scheduled Meds: . bethanechol  10 mg Per Tube TID  . chlorhexidine  15 mL Mouth Rinse BID  . citalopram  30 mg Oral Daily  . [START ON 07/13/2018] enoxaparin (LOVENOX) injection  40 mg Subcutaneous Q24H  . feeding supplement (PRO-STAT SUGAR FREE 64)  30 mL Per Tube Daily  . gabapentin  300-600 mg Oral QPM  . mouth rinse  15 mL Mouth Rinse q12n4p   . propranolol  40 mg Per Tube Q2000  Continuous Infusions: . sodium chloride 250 mL (07/06/18 1041)  . feeding supplement (OSMOLITE 1.5 CAL) 1,000 mL (07/10/18 0529)  . levETIRAcetam 500 mg (07/11/18 0836)     LOS: 28 days     Darlin Drop, MD [facility] Pager [phone]  If 7PM-7AM, please contact night-coverage [email or web address] Password [email or web address] 07/11/2018, 1:25 PM

## 2018-07-11 NOTE — Procedures (Signed)
Interventional Radiology Procedure:   Indications: Dysphagia and probable anoxic brain injury  Procedure: Gastrostomy tube placement  Findings: 20 Fr tube in stomach  Complications: None     EBL: less than 10 ml  Plan: Will evaluate tube tomorrow and anticipate starting tube feeds on 5/19.   Shantele Reller R. Lowella Dandy, MD  Pager: [phone]

## 2018-07-11 NOTE — Progress Notes (Addendum)
  Speech Language Pathology Treatment: Jesse [name] Speaking valve;Cognitive-Linquistic  Patient Details Name: Jesse [name] MRN: [id number] DOB: 1991/01/25 Today's Date: 07/11/2018 Time: 3403-5248 SLP Time Calculation (min) (ACUTE ONLY): 27 min  Assessment / Plan / Recommendation Clinical Impression  Jesse [name] with increased alertness, attention and improved eye contact. With PT assist, pt sat on edge of bed and able to lift head on command without assist 90% of opportunities. Size 8 trach and deflated cuff may be preventing adequate airflow to vocal cords with significant back pressure of exhaled air, wheeze, increase expiratory effort- valve falling off several times and majority was removed by therapist. Was unable to cough or clear throat to command. Pt would benefit from smaller and cuffless trach.   Established yes (one blink), no (two blinks) in response to biographical/environmental/temporal questions 3/4. Followed one step commands 90% (open mouth, protrude tongue, wiggle toes etc). Continues with flat affect however eye contact and attention better that fever is down. Getting PEG today and hopeful transfer to [facility] inpatient rehab center.    HPI HPI: 28 year old male found down in car with suspected drug overdose versus status epilepticus. Intubated 4/20 and trach'd 4/30. 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 Past medical history of alcohol and drug abuse      SLP Plan  Continue with current plan of care       Recommendations  Medication Administration: Via alternative means      Patient may use Passy-Muir Speech Valve: with SLP only PMSV Supervision: Full MD: Please consider changing trach tube to : Smaller  size;Cuffless         General recommendations: Rehab consult Oral Care Recommendations: Oral care QID Follow up Recommendations: Inpatient Rehab SLP Visit Diagnosis: Aphonia (R49.1);Cognitive communication deficit (L85.909) Plan: Continue with current plan of care                       Royce Macadamia 07/11/2018, 9:35 AM   Breck Coons [name].Ed Nurse, children's [phone] Office [phone]

## 2018-07-11 NOTE — Consult Note (Signed)
Chief Complaint: Patient was seen in consultation today for percutaneous gastric tube placement Chief Complaint  Patient presents with   Loss of Consciousness   at the request of Dr Delma Post  Supervising Physician: Richarda Overlie  Patient Status: [facility] - In-pt  History of Present Illness: Jesse [name] is a 28 y.o. male   Found down in vehicle Apparent drug overdose Intubation Hypotensive Hypoxic brain  Injury; encephalopathy Resolving sepsis Dysphagia Malnutrition Long term care  Request made for percutaneous gastric tube placement Anatomy approved   Past Medical History:  Diagnosis Date   Anxiety    Depression    Fetal alcohol syndrome    History of attempted suicide    Polysubstance abuse (HCC)    cocaine, opiates    History reviewed. No pertinent surgical history.  Allergies: Other  Medications: Prior to Admission medications   Medication Sig Start Date End Date Taking? Authorizing Provider  acetaminophen (TYLENOL) 325 MG tablet Take 325-650 mg by mouth every 6 (six) hours as needed for mild pain or headache.   Yes [provider]  aspirin EC 325 MG tablet Take 325 mg by mouth as needed (for headaches or pain).   Yes [provider]  cetirizine (ZYRTEC) 10 MG tablet Take 10 mg by mouth daily.   Yes [provider]  citalopram (CELEXA) 10 MG tablet Take 30 mg by mouth daily.   Yes [provider]  diazepam (VALIUM) 2 MG tablet Take 2 mg by mouth at bedtime as needed (for sleep or anxiety).   Yes [provider]  fluticasone (FLONASE) 50 MCG/ACT nasal spray Place 1 spray into both nostrils 2 (two) times daily as needed for allergies or rhinitis.   Yes [provider]  gabapentin (NEURONTIN) 300 MG capsule Take 300-600 mg by mouth every evening.    Yes [provider]  ibuprofen (ADVIL) 200 MG tablet Take 200-400 mg by mouth every 6 (six) hours as needed for headache or mild pain.   Yes  [provider]  Ibuprofen-diphenhydrAMINE Cit (ADVIL PM) 200-38 MG TABS Take 1-2 tablets by mouth at bedtime as needed (FOR SLEEP).   Yes [provider]  lisdexamfetamine (VYVANSE) 30 MG capsule Take 30 mg by mouth daily.   Yes [provider]  LORazepam (ATIVAN) 0.5 MG tablet Take 0.5 mg by mouth daily as needed for anxiety or sleep.   Yes [provider]  Melatonin 10 MG TABS Take 30-40 mg by mouth at bedtime.   Yes [provider]  naproxen sodium (ALEVE) 220 MG tablet Take 220-440 mg by mouth 2 (two) times daily as needed (for headaches or pain).   Yes [provider]     Family History  Adopted: Yes  Family history unknown: Yes    Social History   Socioeconomic History   Marital status: Single    Spouse name: Not on file   Number of children: Not on file   Years of education: Not on file   Highest education level: Not on file  Occupational History   Not on file  Social Needs   Financial resource strain: Not on file   Food insecurity:    Worry: Not on file    Inability: Not on file   Transportation needs:    Medical: Not on file    Non-medical: Not on file  Tobacco Use   Smoking status: Not on file  Substance and Sexual Activity   Alcohol use: Not on file  Drug use: Not on file   Sexual activity: Not on file  Lifestyle   Physical activity:    Days per week: Not on file    Minutes per session: Not on file   Stress: Not on file  Relationships   Social connections:    Talks on phone: Not on file    Gets together: Not on file    Attends religious service: Not on file    Active member of club or organization: Not on file    Attends meetings of clubs or organizations: Not on file    Relationship status: Not on file  Other Topics Concern   Not on file  Social History Narrative   Not on file     Review of Systems: A 12 point ROS discussed and pertinent positives are indicated in the HPI  above.  All other systems are negative.    Vital Signs: BP 114/78 (BP Location: Left Arm)    Pulse 95    Temp 99.5 F (37.5 C) (Oral)    Resp (!) 9    Ht 6' (1.829 m)    Wt 171 lb 4.8 oz (77.7 kg)    SpO2 98%    BMI 23.23 kg/m   Physical Exam Vitals signs reviewed.  Cardiovascular:     Rate and Rhythm: Normal rate and regular rhythm.     Heart sounds: Normal heart sounds.  Pulmonary:     Comments: trach Neurological:     General: No focal deficit present.     Comments: Consented with family      Imaging: Ct Abdomen Wo Contrast  Result Date: 07/08/2018 CLINICAL DATA:  28 year old male referred for gastrostomy tube placement EXAM: CT ABDOMEN WITHOUT CONTRAST TECHNIQUE: Multidetector CT imaging of the abdomen was performed following the standard protocol without IV contrast. COMPARISON:  None. FINDINGS: Lower chest: Likely atelectatic changes at the right lung base. Hepatobiliary: Unremarkable liver.  Unremarkable gallbladder Pancreas: Unremarkable Spleen: Unremarkable Adrenals/Urinary Tract: Unremarkable appearance of the adrenal glands. No evidence of hydronephrosis of the right or left kidney. No nephrolithiasis. Unremarkable course of the bilateral ureters. Unremarkable appearance of the urinary bladder. Stomach/Bowel: Enteric tube traverses the lower mediastinum, and terminates in the stomach near the pylorus. Colon overlies the inferior margin of stomach with no evidence of prior abdominal surgery of the midline abdomen. Fluid filled right colon. Unremarkable visualized small bowel. No abnormally distended small bowel or visualized colon. Vascular/Lymphatic: Unremarkable vasculature.  No adenopathy Other: No abdominal hernia. No surgical changes of the midline abdomen Musculoskeletal: No acute displaced fracture IMPRESSION: No acute finding of the abdomen. Enteric tube terminates near the pylorus. Electronically Signed   By: Gilmer Mor D.O.   On: 07/08/2018 13:20   Ct Head Wo  Contrast  Result Date: 06/13/2018 CLINICAL DATA:  New onset seizure. EXAM: CT HEAD WITHOUT CONTRAST TECHNIQUE: Contiguous axial images were obtained from the base of the skull through the vertex without intravenous contrast. COMPARISON:  None. FINDINGS: Brain: No evidence of acute infarction, hemorrhage, hydrocephalus, extra-axial collection or mass lesion/mass effect. Vascular: No hyperdense vessel or unexpected calcification. Skull: Normal. Negative for fracture or focal lesion. Sinuses/Orbits: No acute finding. Other: None. IMPRESSION: Normal head CT. Electronically Signed   By: Lupita Raider M.D.   On: 06/13/2018 17:21   Mr Maxine Glenn Head Wo Contrast  Result Date: 06/13/2018 CLINICAL DATA:  Suspected drug overdose.  Found unresponsive. EXAM: MRI HEAD WITHOUT CONTRAST MRA HEAD WITHOUT CONTRAST TECHNIQUE: Multiplanar, multiecho pulse sequences of the  brain and surrounding structures were obtained without intravenous contrast. Angiographic images of the head were obtained using MRA technique without contrast. COMPARISON:  06/13/2018 FINDINGS: MRI HEAD FINDINGS BRAIN: Multifocal abnormal diffusion restriction within both cerebellar hemispheres bilateral deep gray nuclei and multiple locations within the supratentorial white matter. There is petechial hemorrhage within the right basal ganglia. No midline shift or other mass effect. No hydrocephalus. No extra-axial collection. SKULL AND UPPER CERVICAL SPINE: The visualized skull base, calvarium, upper cervical spine and extracranial soft tissues are normal. SINUSES/ORBITS: No fluid levels or advanced mucosal thickening. No mastoid or middle ear effusion. The orbits are normal. MRA HEAD FINDINGS POSTERIOR CIRCULATION: --Basilar artery: Normal. --Posterior cerebral arteries: Normal. Both originate from the basilar artery. --Superior cerebellar arteries: Normal. --Inferior cerebellar arteries: Normal anterior and posterior inferior cerebellar arteries. ANTERIOR  CIRCULATION: --Intracranial internal carotid arteries: Normal. --Anterior cerebral arteries: Normal. Both A1 segments are present. Patent anterior communicating artery. --Middle cerebral arteries: Normal. --Posterior communicating arteries: Present bilaterally IMPRESSION: 1. Multifocal diffusion abnormality within the deep gray nuclei, bilateral supratentorial white matter and both cerebellar hemispheres, most consistent with anoxic brain injury. 2. Petechial hemorrhage within the right basal ganglia without mass effect. 3. Normal intracranial MRA. Electronically Signed   By: Deatra Robinson M.D.   On: 06/13/2018 21:20   Mr Brain Wo Contrast  Result Date: 06/13/2018 CLINICAL DATA:  Suspected drug overdose.  Found unresponsive. EXAM: MRI HEAD WITHOUT CONTRAST MRA HEAD WITHOUT CONTRAST TECHNIQUE: Multiplanar, multiecho pulse sequences of the brain and surrounding structures were obtained without intravenous contrast. Angiographic images of the head were obtained using MRA technique without contrast. COMPARISON:  06/13/2018 FINDINGS: MRI HEAD FINDINGS BRAIN: Multifocal abnormal diffusion restriction within both cerebellar hemispheres bilateral deep gray nuclei and multiple locations within the supratentorial white matter. There is petechial hemorrhage within the right basal ganglia. No midline shift or other mass effect. No hydrocephalus. No extra-axial collection. SKULL AND UPPER CERVICAL SPINE: The visualized skull base, calvarium, upper cervical spine and extracranial soft tissues are normal. SINUSES/ORBITS: No fluid levels or advanced mucosal thickening. No mastoid or middle ear effusion. The orbits are normal. MRA HEAD FINDINGS POSTERIOR CIRCULATION: --Basilar artery: Normal. --Posterior cerebral arteries: Normal. Both originate from the basilar artery. --Superior cerebellar arteries: Normal. --Inferior cerebellar arteries: Normal anterior and posterior inferior cerebellar arteries. ANTERIOR CIRCULATION:  --Intracranial internal carotid arteries: Normal. --Anterior cerebral arteries: Normal. Both A1 segments are present. Patent anterior communicating artery. --Middle cerebral arteries: Normal. --Posterior communicating arteries: Present bilaterally IMPRESSION: 1. Multifocal diffusion abnormality within the deep gray nuclei, bilateral supratentorial white matter and both cerebellar hemispheres, most consistent with anoxic brain injury. 2. Petechial hemorrhage within the right basal ganglia without mass effect. 3. Normal intracranial MRA. Electronically Signed   By: Deatra Robinson M.D.   On: 06/13/2018 21:20   Dg Chest Port 1 View  Result Date: 07/07/2018 CLINICAL DATA:  28 year old male with tracheostomy and hypoxia. EXAM: PORTABLE CHEST 1 VIEW COMPARISON:  07/04/2018 and earlier. FINDINGS: Portable AP semi upright view at 0606 hours. Tracheostomy tube with no adverse features. Stable visible enteric tube. Normal cardiac size and mediastinal contours. Platelike atelectasis at the right lung base is stable. No pneumothorax, pulmonary edema, pleural effusion or other confluent opacity. Negative visible bowel gas pattern. IMPRESSION: 1. Tracheostomy tube with no adverse features. 2. Stable atelectasis at the right lung base. Electronically Signed   By: Odessa Fleming M.D.   On: 07/07/2018 08:48   Dg Chest Port 1 View  Result Date: 07/04/2018 CLINICAL  DATA:  Difficulty breathing the EXAM: PORTABLE CHEST 1 VIEW COMPARISON:  Jun 25, 2018 FINDINGS: Tracheostomy catheter tip is 3.8 cm above the carina. Feeding tube tip is in the distal stomach. No pneumothorax. There is a focal area of airspace consolidation in the medial right base inferiorly. Lungs elsewhere are clear. Heart size and pulmonary vascularity are normal. No adenopathy. No bone lesions. IMPRESSION: Tube and catheter positions as described without pneumothorax. Small area of apparent pneumonia in the inferior aspect medial right base. Lungs elsewhere are clear.  Heart size normal. No evident adenopathy. Electronically Signed   By: Bretta Bang III M.D.   On: 07/04/2018 14:28   Dg Chest Port 1 View  Result Date: 06/25/2018 CLINICAL DATA:  Respiratory failure EXAM: PORTABLE CHEST 1 VIEW COMPARISON:  Yesterday FINDINGS: Tracheostomy tube in place. There is a feeding tube which at least reaches the stomach. Streaky densities at the bases. No edema, effusion, or pneumothorax. IMPRESSION: Stable atelectasis at the bases. Electronically Signed   By: Marnee Spring M.D.   On: 06/25/2018 07:34   Dg Chest Port 1 View  Result Date: 06/24/2018 CLINICAL DATA:  Atelectasis of right lung EXAM: PORTABLE CHEST - 1 VIEW COMPARISON:  the previous day's study FINDINGS: Lung apices are partially excluded. Tracheal tube partially visualized. Significant improvement in the right lower lobe atelectasis seen previously with minimal infrahilar residual. Left lung remains clear. Heart size and mediastinal contours are within normal limits. No effusion. Visualized bones unremarkable. IMPRESSION: Improving right lower lung atelectasis Electronically Signed   By: Corlis Leak M.D.   On: 06/24/2018 07:53   Dg Chest Port 1 View  Result Date: 06/23/2018 CLINICAL DATA:  28 year old male with a history of tracheostomy EXAM: PORTABLE CHEST 1 VIEW COMPARISON:  06/22/2018 FINDINGS: Cardiomediastinal silhouette unchanged in size and contour. New opacity at the right lung base obscuring the right hemidiaphragm. Interval placement of tracheostomy tube terminating suitably above the carina. Interval removal of the gastric tube. Left lung relatively well aerated. IMPRESSION: New opacity at the right lung base, compatible with right lower lobe consolidation and/or atelectasis, aspiration not excluded. Interval placement of tracheostomy tube Electronically Signed   By: Gilmer Mor D.O.   On: 06/23/2018 15:21   Dg Chest Port 1 View  Result Date: 06/22/2018 CLINICAL DATA:  Endotracheal tube. EXAM:  PORTABLE CHEST 1 VIEW COMPARISON:  One-view chest x-ray 06/19/2018 FINDINGS: Endotracheal tube terminates at the level clavicles, well above the carina. NG tube courses off the inferior border the film. The heart size is normal. Right lower lobe pneumonia continues to improve. No new airspace disease is present. IMPRESSION: 1. Continued improvement of right lower lobe pneumonia. 2. Support apparatus is stable. Electronically Signed   By: Marin Roberts M.D.   On: 06/22/2018 07:40   Dg Chest Port 1 View  Result Date: 06/19/2018 CLINICAL DATA:  Respiratory failure. EXAM: PORTABLE CHEST 1 VIEW COMPARISON:  06/18/2018. FINDINGS: Normal heart size. ET tube 5.9 cm above carina. RIGHT basilar infiltrate is improved. LEFT lung remains clear. Nasogastric tube is in the stomach. IMPRESSION: Improved aspiration pneumonia. Electronically Signed   By: Elsie Stain M.D.   On: 06/19/2018 07:42   Dg Chest Port 1 View  Result Date: 06/18/2018 CLINICAL DATA:  Found down. Drug overdose. EXAM: PORTABLE CHEST 1 VIEW COMPARISON:  06/15/2018. FINDINGS: Unchanged and satisfactory position ET tube and nasogastric tube. RIGHT lower lobe pneumonia and RIGHT basilar atelectasis appear slightly improved, when technique differences are considered. No similar findings on the  LEFT. Negative osseous structures. IMPRESSION: Slight improvement aeration. Electronically Signed   By: Elsie Stain M.D.   On: 06/18/2018 07:29   Dg Chest Port 1 View  Result Date: 06/15/2018 CLINICAL DATA:  Follow-up endotracheal tube and right basilar infiltrate EXAM: PORTABLE CHEST 1 VIEW COMPARISON:  06/14/2018 FINDINGS: Cardiac shadow is stable. Endotracheal tube and gastric catheter are again seen and stable. Persistent right basilar infiltrate is noted slightly worsened when compared with the prior exam. The left lung remains clear. IMPRESSION: Slight worsening of right basilar infiltrate. Electronically Signed   By: Alcide Clever M.D.   On:  06/15/2018 08:13   Dg Chest Port 1 View  Result Date: 06/14/2018 CLINICAL DATA:  Respiratory failure EXAM: PORTABLE CHEST 1 VIEW COMPARISON:  06/13/2018 FINDINGS: Cardiac shadows within normal limits. Endotracheal tube and gastric catheter are again noted in satisfactory position. The lungs are well aerated bilaterally. New right basilar infiltrate is seen. This may be related to some mucous plugging given the acute nature. IMPRESSION: New right basilar infiltrate. This may be related to underlying mucous plugging. Electronically Signed   By: Alcide Clever M.D.   On: 06/14/2018 08:09   Dg Chest Portable 1 View  Result Date: 06/13/2018 CLINICAL DATA:  Unresponsive EXAM: PORTABLE CHEST 1 VIEW COMPARISON:  None. FINDINGS: Endotracheal tube tip is 7.3 cm from the carina. NG tube tip is beyond the gastroesophageal junction. Normal heart size. Lungs clear. No pneumothorax. No pleural effusion. IMPRESSION: Endotracheal and NG tubes as described.  Clear lungs. Electronically Signed   By: Jolaine Click M.D.   On: 06/13/2018 16:28   Dg Abd Portable 1v  Result Date: 07/11/2018 CLINICAL DATA:  For possible gastrostomy tube placement EXAM: PORTABLE ABDOMEN - 1 VIEW COMPARISON:  None. FINDINGS: Contrast material noted throughout the colon which is separate and inferior from the stomach. Feeding tube tip is in the distal stomach or proximal duodenum. No evidence of bowel obstruction, organomegaly or free air. IMPRESSION: No acute findings. Electronically Signed   By: Charlett Nose M.D.   On: 07/11/2018 08:38    Labs:  CBC: Recent Labs    06/26/18 0435 07/06/18 0159 07/08/18 0736 07/11/18 0717  WBC 17.1* 10.3 8.7 9.9  HGB 12.1* 14.4 13.4 15.9  HCT 36.0* 44.1 41.6 46.8  PLT 480* 345 235 247    COAGS: Recent Labs    06/13/18 1609 06/23/18 0541 07/08/18 0736  INR 1.2 1.2 1.3*  APTT  --  31  --     BMP: Recent Labs    07/06/18 0159 07/08/18 0736 07/09/18 0532 07/11/18 0717  NA 145 145 144 143   K 3.9 3.8 3.7 4.5  CL 111 108 107 108  CO2 GLUCOSE 125* 125* 117* 94  BUN 31* 31* 28* 32*  CALCIUM 9.3 9.1 9.3 10.0  CREATININE 0.70 0.71 0.65 0.78  GFRNONAA >60 >60 >60 >60  GFRAA >60 >60 >60 >60    LIVER FUNCTION TESTS: Recent Labs    06/25/18 0350 07/08/18 0736 07/09/18 0532 07/11/18 0717  BILITOT 1.2 0.7 0.7 1.2  AST 39 55* 57* 52*  ALT 77* 74* 75* 67*  ALKPHOS 70 68 70 69  PROT 6.9 7.0 6.7 7.8  ALBUMIN 3.1* 3.0* 3.0* 3.5    TUMOR MARKERS: No results for input(s): AFPTM, CEA, CA199, CHROMGRNA in the last 8760 hours.  Assessment and Plan:  Dysphagia Malnutrition Drug overdose For SNF placement Scheduled for percutaneous gastric tube placement Risks and benefits discussed with  the patient's family including, but not limited to the need for a barium enema during the procedure, bleeding, infection, peritonitis, or damage to adjacent structures.  All of the patient's family questions were answered, they are agreeable to proceed. Consent signed and in chart.  Thank you for this interesting consult.  I greatly enjoyed meeting Jesse [name] and look forward to participating in their care.  A copy of this report was sent to the requesting provider on this date.  Electronically Signed: Robet LeuPamela A [place], PA-C 07/11/2018, 2:40 PM   I spent a total of 40 Minutes    in face to face in clinical consultation, greater than 50% of which was counseling/coordinating care for percutaneous gastric tube placement

## 2018-07-11 NOTE — Sedation Documentation (Signed)
Due to patient being on trach collar, unable to monitor ETCO2

## 2018-07-11 NOTE — Progress Notes (Signed)
Physical Therapy Treatment Patient Details Name: Jesse [name] MRN: [id number] DOB: 11-Jul-1990 Today's Date: 07/11/2018    History of Present Illness 28 year old male found down in car with suspected drug overdose versus status epilepticus. Intubated 4/20. MRI showing multiple areas of restricted diffusion, predominantly in the cerebellar hemispheres which might be representative of hypoxic/anoxic injury but specifically in bilateral globus pallidus and lentiform nuclei on the right. No significant medical past history but history of alcohol and drug abuse.     PT Comments    Patient seen in conjunction with SLP for EOB and engagement. Patient more alert and engaged today, following simple commands with some degree of consistency. Distal RLE movement upon command and cervical and trunk engagement as well. Continue to feel current POC remains appropriate. Will continue to see and progress as tolerated. (deferred OOB activity today due to anticipation of PEG placement procedure).   Follow Up Recommendations  CIR     Equipment Recommendations       Recommendations for Other Services       Precautions / Restrictions Precautions Precautions: Fall Precaution Comments: trach Restrictions Weight Bearing Restrictions: No    Mobility  Bed Mobility Overal bed mobility: Needs Assistance Bed Mobility: Rolling;Sidelying to Sit;Sit to Sidelying     Supine to sit: Max assist;+2 for physical assistance Sit to supine: Max assist;+2 for physical assistance   General bed mobility comments: Max assist for mobility to EOB and to return to supine, some modest activation and attempts with RLE for positioning  Transfers                 General transfer comment: did not attempt this date   Ambulation/Gait                 Stairs             Wheelchair Mobility    Modified Rankin (Stroke Patients Only)       Balance Overall balance assessment: Needs  assistance Sitting-balance support: Feet supported;Bilateral upper extremity supported Sitting balance-Leahy Scale: Fair Sitting balance - Comments: Able to engage core and demonstrates some righting reactions when positioned and trunk release trialed.  Postural control: Posterior lean                                  Cognition Arousal/Alertness: Awake/alert Behavior During Therapy: Flat affect Overall Cognitive Status: Impaired/Different from baseline Area of Impairment: Attention;Following commands                   Current Attention Level: Focused   Following Commands: Follows one step commands inconsistently;Follows one step commands with increased time     Problem Solving: Slow processing;Decreased initiation;Requires tactile cues;Requires verbal cues General Comments: Pt turning eyes to therapist on command, visually tracking       Exercises General Exercises - Lower Extremity Ankle Circles/Pumps: PROM;Left;Right;Supine;10 reps Short Arc Quad: PROM;Both;Supine;10 reps Hip ABduction/ADduction: PROM;Right;Left;5 reps;Supine Hip Flexion/Marching: PROM;Right;Left;Supine;10 reps Other Exercises Other Exercises: AAROM cervical rotation to left and right    General Comments        Pertinent Vitals/Pain Pain Assessment: Faces Faces Pain Scale: Hurts little more Pain Descriptors / Indicators: Discomfort Pain Intervention(s): Monitored during session;Repositioned    Home Living                      Prior Function  PT Goals (current goals can now be found in the care plan section) Acute Rehab PT Goals Patient Stated Goal: unable to state Progress towards PT goals: Progressing toward goals(modest progression, more engaged and alert today)    Frequency    Min 3X/week      PT Plan Frequency needs to be updated    Co-evaluation PT/OT/SLP Co-Evaluation/Treatment: Yes Reason for Co-Treatment: Complexity of the patient's  impairments (multi-system involvement) PT goals addressed during session: Mobility/safety with mobility   SLP goals addressed during session: Communication;Cognition    AM-PAC PT "6 Clicks" Mobility   Outcome Measure  Help needed turning from your back to your side while in a flat bed without using bedrails?: Total Help needed moving from lying on your back to sitting on the side of a flat bed without using bedrails?: Total Help needed moving to and from a bed to a chair (including a wheelchair)?: Total Help needed standing up from a chair using your arms (e.g., wheelchair or bedside chair)?: Total Help needed to walk in hospital room?: Total Help needed climbing 3-5 steps with a railing? : Total 6 Click Score: 6    End of Session Equipment Utilized During Treatment: Oxygen Activity Tolerance: Patient tolerated treatment well Patient left: in bed;with call bell/phone within reach;with SCD's reapplied(in chair position) Nurse Communication: Mobility status PT Visit Diagnosis: Other symptoms and signs involving the nervous system (T36.468)     Time: 0321-2248 PT Time Calculation (min) (ACUTE ONLY): 27 min  Charges:  $Therapeutic Activity: 8-22 mins                     Charlotte Crumb, PT DPT  Board Certified Neurologic Specialist Acute Rehabilitation Services Pager [phone] Office [phone]    Fabio Asa 07/11/2018, 9:39 AM

## 2018-07-11 NOTE — Progress Notes (Signed)
[facility] for Infectious Disease    Date of Admission:  06/13/2018   Total days of antibiotics 14        Meropenum discontinued on 05/15 after initiation on 05/04 for 12 days of therapy        Vancomycin given x 2 days and discontinued on 05/13                Reason for Consult: Fever of unknown etiology    Referring Provider: [facility] Primary Care Provider: N/A  Assessment: Fever of unknown etiology: Last documented temp > 100.4 recorded to be 100.9 @0701  hours on 05/15, since that time temp max has been 99.5 recorded @ 0701 on 05/18. Patient has remained slightly tachycardic at time but vitals relatively stable aside. Currently no signs of infection after reviewing the CXR with stable atelectasis of the right lung base on 05/14, CT ABD/PELVIS w/o contrast unremarkable on 05/15, UA from 05/12 with rare bacteria and low protein/ketones, SARS COVID negative on 05/12, HIV unreactive, not neutropenic, pneumonia improving/resolved, ESR/CRP Bl Cx from 05/12 are negative x 5 days. Urine has demonstrated no growth from 05/12. Tracheal aspirate from 04/30 with G+ cocci and few klebsiella pneumonia s/p treatment. Most recent CBC as of this morning with WBC count of 9.9.  I feel that he may be experiencing a fever 2/2 to his anoxic brain injury with diffuse neurological injury and likely hypothalamic dysfunction. No clear infectious etiology. -CMV IgM, EBV and Hepatitis panel pending from 05/16, hepatitis panel reordered this am   Encephalopathy: Likely 2/2 to anoxic brain injury with multiple possible etiologies including TBI, CO2 toxicity, as a result of drug overdose and/or seizure with trauma/hypoxia. Minimal improvement over the past several days as per nursing staff. Will likely require a prolonged course of physical therapy if recovery is to occur.   Plan: 1. F/up on CMV IgM, EBV, and hepatitis panel 2.  Re-consult if the patient worsens.   Active Problems:   Altered  mental status   Acute respiratory failure with hypoxia (HCC)   Status post tracheostomy ([place])   Drug overdose   Shock ([place])   Goals of care, counseling/discussion   Palliative care by specialist   AKI (acute kidney injury) ([place])   Tachypnea   Acute blood loss anemia   Drug addiction ([place])   Anoxic brain injury ([place])   Palliative care encounter  Scheduled Meds: . bethanechol  10 mg Per Tube TID  . chlorhexidine  15 mL Mouth Rinse BID  . citalopram  30 mg Oral Daily  . [START ON 07/13/2018] enoxaparin (LOVENOX) injection  40 mg Subcutaneous Q24H  . feeding supplement (PRO-STAT SUGAR FREE 64)  30 mL Per Tube Daily  . gabapentin  300-600 mg Oral QPM  . mouth rinse  15 mL Mouth Rinse q12n4p  . propranolol  40 mg Per Tube Q2000   Continuous Infusions: . sodium chloride 250 mL (07/06/18 1041)  . feeding supplement (OSMOLITE 1.5 CAL) 1,000 mL (07/10/18 0529)  . levETIRAcetam 500 mg (07/11/18 0836)   PRN Meds:.acetaminophen (TYLENOL) oral liquid 160 mg/5 mL, albuterol, bisacodyl, diazepam, docusate, fentaNYL (SUBLIMAZE) injection, ibuprofen, ondansetron (ZOFRAN) IV  HPI: Jesse [name] is a 28 y.o. male absent a significant PMHx who was found unresponsive in his vehicle suspected of being postictal. He was rigid on exam and did not respond to 2.25m IV midazolam or naloxone which was administered as he was found with multiple unmarked pills  in his car. He was intubated in the ED for airway protection and required intensive care due to hypotension. He developed a staph aureus and klebsiella pneumonia for which he received 12 days of antibiotic therapy. He continued to be encephalopathic likely 2/2 to an acute anoxic injury thought to possibly be due to CO2 toxicity vs TBI vs drug overdose.He required tracheostomy April 30, in orderto be liberated from mechanical ventilation. The patient remains clinically stable but unable to follow commands aside from moving his right hand and opening his  eyes.    Review of Systems: ROS-Patient unable to respond due to medical condition. Able to move right hand and open eyes on command only. Does not interact with blinking etc on command when questioned.   Past Medical History:  Diagnosis Date  . Anxiety   . Depression   . Fetal alcohol syndrome   . History of attempted suicide   . Polysubstance abuse (HCC)    cocaine, opiates    Social History   Tobacco Use  . Smoking status: Not on file  Substance Use Topics  . Alcohol use: Not on file  . Drug use: Not on file    Family History  Adopted: Yes  Family history unknown: Yes   Allergies  Allergen Reactions  . Other Other (See Comments)    Seasonal allergies- Stuffy nose, itchy eyes, congestion, etc..    OBJECTIVE: Blood pressure 118/83, pulse (!) 103, temperature 99.5 F (37.5 C), temperature source Oral, resp. rate 14, height 6' (1.829 m), weight 77.7 kg, SpO2 94 %.  Physical Exam Vitals signs reviewed.  Constitutional:      General: He is not in acute distress.    Appearance: He is diaphoretic.  HENT:     Head: Normocephalic and atraumatic.  Eyes:     Pupils: Pupils are equal, round, and reactive to light.  Cardiovascular:     Rate and Rhythm: Normal rate and regular rhythm.     Heart sounds: No murmur.  Pulmonary:     Effort: Pulmonary effort is normal.     Breath sounds: Normal breath sounds.  Abdominal:     General: Abdomen is flat. Bowel sounds are normal.  Skin:    General: Skin is warm.     Capillary Refill: Capillary refill takes less than 2 seconds.     Coloration: Skin is pale.  Neurological:     Comments: Responds only with opening eyes and moving right index finger.      Lab Results Lab Results  Component Value Date   WBC 9.9 07/11/2018   HGB 15.9 07/11/2018   HCT 46.8 07/11/2018   MCV 90.2 07/11/2018   PLT 247 07/11/2018    Lab Results  Component Value Date   CREATININE 0.78 07/11/2018   BUN 32 (H) 07/11/2018   NA 143 07/11/2018    K 4.5 07/11/2018   CL 108 07/11/2018   CO2 23 07/11/2018    Lab Results  Component Value Date   ALT 67 (H) 07/11/2018   AST 52 (H) 07/11/2018   ALKPHOS 69 07/11/2018   BILITOT 1.2 07/11/2018     Microbiology: Recent Results (from the past 240 hour(s))  Culture, Urine     Status: None   Collection Time: 07/05/18  9:41 AM  Result Value Ref Range Status   Specimen Description URINE, RANDOM  Final   Special Requests Normal  Final   Culture   Final    NO GROWTH Performed at [facility],  [address], [place], [place] [address]    Report Status 07/06/2018 FINAL  Final  Expectorated sputum assessment w rflx to resp cult     Status: None   Collection Time: 07/05/18  9:41 AM  Result Value Ref Range Status   Specimen Description EXPECTORATED SPUTUM  Final   Special Requests Normal  Final   Sputum evaluation   Final    THIS SPECIMEN IS ACCEPTABLE FOR SPUTUM CULTURE Performed at [facility], [place] [address] [place], [place] [address]    Report Status 07/05/2018 FINAL  Final  Culture, respiratory     Status: None   Collection Time: 07/05/18  9:41 AM  Result Value Ref Range Status   Specimen Description EXPECTORATED SPUTUM  Final   Special Requests Normal Reflexed from [id number]  Final   Gram Stain   Final    MODERATE WBC PRESENT, PREDOMINANTLY PMN RARE SQUAMOUS EPITHELIAL CELLS PRESENT NO ORGANISMS SEEN Performed at [facility], [place] [address], [place], [place] [address]    Culture FEW CANDIDA ALBICANS  Final   Report Status 07/07/2018 FINAL  Final  Culture, blood (routine x 2)     Status: None   Collection Time: 07/05/18 10:00 AM  Result Value Ref Range Status   Specimen Description BLOOD RIGHT HAND  Final   Special Requests   Final    BOTTLES DRAWN AEROBIC AND ANAEROBIC Blood Culture adequate volume   Culture   Final    NO GROWTH 5 DAYS Performed at [facility], [place] [address] [place], [place] [address]    Report Status 07/10/2018 FINAL   Final  Culture, blood (routine x 2)     Status: None   Collection Time: 07/05/18 10:15 AM  Result Value Ref Range Status   Specimen Description BLOOD LEFT HAND  Final   Special Requests   Final    BOTTLES DRAWN AEROBIC AND ANAEROBIC Blood Culture adequate volume   Culture   Final    NO GROWTH 5 DAYS Performed at [facility], [place] [address], [place], [place] [address]    Report Status 07/10/2018 FINAL  Final  SARS Coronavirus 2 (CEPHEID - Performed in [place] hospital lab), Hosp Order     Status: None   Collection Time: 07/05/18 10:55 AM  Result Value Ref Range Status   SARS Coronavirus 2 NEGATIVE NEGATIVE Final    Comment: (NOTE) If result is NEGATIVE SARS-CoV-2 target nucleic acids are NOT DETECTED. The SARS-CoV-2 RNA is generally detectable in upper and lower  respiratory specimens during the acute phase of infection. The lowest  concentration of SARS-CoV-2 viral copies this assay can detect is 250  copies / mL. A negative result does not preclude SARS-CoV-2 infection  and should not be used as the sole basis for treatment or other  patient management decisions.  A negative result may occur with  improper specimen collection / handling, submission of specimen other  than nasopharyngeal swab, presence of viral mutation(s) within the  areas targeted by this assay, and inadequate number of viral copies  (<250 copies / mL). A negative result must be combined with clinical  observations, patient history, and epidemiological information. If result is POSITIVE SARS-CoV-2 target nucleic acids are DETECTED. The SARS-CoV-2 RNA is generally detectable in upper and lower  respiratory specimens dur ing the acute phase of infection.  Positive  results are indicative of active infection with SARS-CoV-2.  Clinical  correlation with patient history and other diagnostic  information is  necessary to determine patient infection status.  Positive results do  not rule out bacterial  infection or co-infection with other viruses. If result is PRESUMPTIVE POSTIVE SARS-CoV-2 nucleic acids MAY BE PRESENT.   A presumptive positive result was obtained on the submitted specimen  and confirmed on repeat testing.  While 2019 novel coronavirus  (SARS-CoV-2) nucleic acids may be present in the submitted sample  additional confirmatory testing may be necessary for epidemiological  and / or clinical management purposes  to differentiate between  SARS-CoV-2 and other Sarbecovirus currently known to infect humans.  If clinically indicated additional testing with an alternate test  methodology [phone]) is advised. The SARS-CoV-2 RNA is generally  detectable in upper and lower respiratory sp ecimens during the acute  phase of infection. The expected result is Negative. Fact Sheet for Patients:  StrictlyIdeas.no Fact Sheet for Healthcare Providers: BankingDealers.co.za This test is not yet approved or cleared by the [place] FDA and has been authorized for detection and/or diagnosis of SARS-CoV-2 by FDA under an Emergency Use Authorization (EUA).  This EUA will remain in effect (meaning this test can be used) for the duration of the COVID-19 declaration under Section 564(b)(1) of the Act, 21 U.S.C. section 360bbb-3(b)(1), unless the authorization is terminated or revoked sooner. Performed at [facility], [place] [address], [place], [place] [address]   MRSA PCR Screening     Status: None   Collection Time: 07/05/18  1:00 PM  Result Value Ref Range Status   MRSA by PCR NEGATIVE NEGATIVE Final    Comment:        The GeneXpert MRSA Assay (FDA approved for NASAL specimens only), is one component of a comprehensive MRSA colonization surveillance program. It is not intended to diagnose MRSA infection nor to guide or monitor treatment for MRSA infections. Performed at [facility], [place] [address], [place], [place]  [address]     Kathi Ludwig, [place] [facility] [phone] pager   [phone] cell 07/11/2018, 9:40 AM

## 2018-07-12 DIAGNOSIS — R4020 Unspecified coma: Secondary | ICD-10-CM

## 2018-07-12 LAB — GLUCOSE, CAPILLARY
Glucose-Capillary: 101 mg/dL — ABNORMAL HIGH (ref 70–99)
Glucose-Capillary: 141 mg/dL — ABNORMAL HIGH (ref 70–99)
Glucose-Capillary: 81 mg/dL (ref 70–99)
Glucose-Capillary: 84 mg/dL (ref 70–99)
Glucose-Capillary: 95 mg/dL (ref 70–99)

## 2018-07-12 LAB — HEPATITIS PANEL, ACUTE
HCV Ab: 0.2 s/co ratio (ref 0.0–0.9)
Hep A IgM: NEGATIVE
Hep B C IgM: NEGATIVE
Hepatitis B Surface Ag: NEGATIVE

## 2018-07-12 MED ORDER — OSMOLITE 1.5 CAL PO LIQD
480.0000 mL | Freq: Three times a day (TID) | ORAL | Status: DC
  Administered 2018-07-12: 120 mL
  Filled 2018-07-12 (×2): qty 711

## 2018-07-12 MED ORDER — DIAZEPAM 5 MG PO TABS: 2.5000 mg | ORAL_TABLET | Freq: Every evening | ORAL | Status: DC | PRN

## 2018-07-12 MED ORDER — MORPHINE SULFATE (PF) 2 MG/ML IV SOLN
1.0000 mg | INTRAVENOUS | Status: DC | PRN
  Administered 2018-07-12: 1 mg via INTRAVENOUS
  Filled 2018-07-12: qty 1

## 2018-07-12 MED ORDER — PRO-STAT SUGAR FREE PO LIQD
30.0000 mL | Freq: Three times a day (TID) | ORAL | Status: DC
  Administered 2018-07-12: 14:00:00 30 mL
  Filled 2018-07-12: qty 30

## 2018-07-12 MED ORDER — GABAPENTIN 300 MG/6ML PO SOLN
300.0000 mg | Freq: Every evening | ORAL | Status: DC
  Administered 2018-07-13 (×2): 300 mg
  Filled 2018-07-12 (×4): qty 6

## 2018-07-12 MED ORDER — IBUPROFEN 200 MG PO TABS
200.0000 mg | ORAL_TABLET | Freq: Four times a day (QID) | ORAL | Status: DC | PRN
  Administered 2018-07-12: 200 mg
  Filled 2018-07-12: qty 1

## 2018-07-12 MED ORDER — OSMOLITE 1.5 CAL PO LIQD
1000.0000 mL | ORAL | Status: DC
  Administered 2018-07-12: 1000 mL
  Filled 2018-07-12: qty 1000

## 2018-07-12 MED ORDER — CITALOPRAM HYDROBROMIDE 20 MG PO TABS
30.0000 mg | ORAL_TABLET | Freq: Every day | ORAL | Status: DC
  Administered 2018-07-13 – 2018-07-14 (×2): 30 mg
  Filled 2018-07-12 (×2): qty 1

## 2018-07-12 MED ORDER — PROPRANOLOL HCL 20 MG/5ML PO SOLN
20.0000 mg | Freq: Once | ORAL | Status: AC
  Administered 2018-07-12: 17:00:00 20 mg
  Filled 2018-07-12: qty 5

## 2018-07-12 NOTE — Progress Notes (Signed)
Patient ID: Jesse [name], male   DOB: [birth year], 28 y.o.   MRN: [id number]  IR Round via phone New regulations  Gastric tube placed in IR 5/18  Site is clean and dry No bleeding +BS per RN Has used for meds without issue already this am  May use now

## 2018-07-12 NOTE — Progress Notes (Signed)
Nutrition Follow-up   RD working remotely.  DOCUMENTATION CODES:   Not applicable  INTERVENTION:  Initiate bolus tube feeds using Osmolite 1.5 formula via PEG, Start at volume of 120 ml (half carton/ARC) and advance by 120 ml every feeding to goal bolus volume of 480 ml (2 cartons/ARCs) given TID.   Provide 30 ml Prostat TID.  Tube feeding regimen provides 2460 kcal (100% of needs), 135 grams of protein, and 1094 ml free water.   NUTRITION DIAGNOSIS:   Inadequate oral intake related to inability to eat as evidenced by NPO status; ongoing  GOAL:   Patient will meet greater than or equal to 90% of their needs; progressing  MONITOR:   Labs, Weight trends, I & O's, Skin, TF tolerance  REASON FOR ASSESSMENT:   Consult, Ventilator Enteral/tube feeding initiation and management  ASSESSMENT:   Pt with PMH of substance abuse and prior suicide attempt admitted 4/20 with suspected drug overdose (UDS positive on admission for amphetamines, cocaine, and benzos), suspected RLL aspiration PNA, and AKI after being found down in his car outside of a hotel.    Tracheostomy placed 4/30. Pt extubated to ATC 5/3. Pt remains NPO. Ptwith dysphagia and probableanoxic brain injury. Family requested transfer to [facility] for continued neuro therapy. Transfer has been accepted pending medical clearance and PEG tube placement. PEG placed yesterday. Possible transfer tomorrow per MD. IR has cleared PEG ready for use today. RD has been consulted for tube feeding re-initiation. Plans to initiate bolus tube feeds.   Labs and medications reviewed.   Diet Order:   Diet Order            Diet NPO time specified Except for: Ice Chips  Diet effective now              EDUCATION NEEDS:   No education needs have been identified at this time  Skin:  Skin Assessment: Reviewed RN Assessment  Last BM:  5/16  Height:   Ht Readings from Last 1 Encounters:  06/13/18 6' (1.829 m)    Weight:   Wt  Readings from Last 1 Encounters:  07/12/18 77.7 kg    Ideal Body Weight:  80.9 kg  BMI:  Body mass index is 23.23 kg/m.  Estimated Nutritional Needs:   Kcal:  2400-2600  Protein:  115-135 grams  Fluid:  > 2 L/day    Roslyn Smiling, MS, RD, LDN Pager # [phone] After hours/ weekend pager # [phone]

## 2018-07-12 NOTE — Progress Notes (Signed)
Nutrition Brief Note  RD paged via on-call pager. Spoke with MD via phone call.  MD reports pt is not tolerating bolus tube feeds well. Unit RD saw pt today and placed the following tube feeding orders: - Osmolite 1.5 formula via PEG, start at volume of 120 ml (half carton/ARC) and advance by 120 ml every feeding to goal bolus volume of 480 ml (2 cartons/ARCs) given TID - Provide 30 ml Prostat TID.  Per MAR, one bolus has been administered thus far today.  MD reports that since pt had PEG placed yesterday, he has developed a fever and become more tachycardic. MD asks that bolus tube feeds be held for now and to start continuous trickle tube feeds. Do not suspect that pt's change in status is related to tube feeds. RD will change bolus regimen to continuous trickle regimen at this time and monitor for tolerance. Will also d/c Pro-stat at this time.  RD will follow up tomorrow to assess for ability to increase continuous tube feeding rate vs retry bolus tube feeds.   Earma Reading, MS, RD, LDN Inpatient Clinical Dietitian Pager: [phone] Weekend/After Hours: [phone]

## 2018-07-12 NOTE — Progress Notes (Signed)
Physical Therapy Treatment Patient Details Name: Jesse [name] MRN: [id number] DOB: Aug 02, 1990 Today's Date: 07/12/2018    History of Present Illness 28 year old male found down in car with suspected drug overdose versus status epilepticus. Intubated 4/20. MRI showing multiple areas of restricted diffusion, predominantly in the cerebellar hemispheres which might be representative of hypoxic/anoxic injury but specifically in bilateral globus pallidus and lentiform nuclei on the right. No significant medical past history but history of alcohol and drug abuse.     PT Comments    Patient seen for therapy intervention with focus on command following and therapeutic exercise. Patient with noted isolated movement in RUE with some functional use during bed mobility. Following simple commands inconsistently with active movement right UE and LE. Current POC remains appropriate.   Follow Up Recommendations  CIR     Equipment Recommendations       Recommendations for Other Services       Precautions / Restrictions Precautions Precautions: Fall Precaution Comments: trach    Mobility  Bed Mobility Overal bed mobility: Needs Assistance Bed Mobility: Rolling Rolling: Max assist         General bed mobility comments: max assist to roll bilaterally, when rolling on to left side, patietn able to use RUE to hold on to railing  Transfers                    Ambulation/Gait                 Stairs             Wheelchair Mobility    Modified Rankin (Stroke Patients Only)       Balance                                            Cognition Arousal/Alertness: Awake/alert Behavior During Therapy: Flat affect Overall Cognitive Status: Impaired/Different from baseline Area of Impairment: Attention;Following commands                   Current Attention Level: Focused   Following Commands: Follows one step commands  inconsistently;Follows one step commands with increased time   Awareness: Intellectual Problem Solving: Slow processing;Decreased initiation;Requires tactile cues;Requires verbal cues General Comments: Pt turning eyes to therapist on command, visually tracking       Exercises General Exercises - Lower Extremity Ankle Circles/Pumps: PROM;Left;Right;Supine;10 reps Short Arc Quad: PROM;Both;Supine;10 reps Heel Slides: PROM;Right;Left;5 reps;Supine Hip ABduction/ADduction: PROM;Right;Left;5 reps;Supine Hip Flexion/Marching: PROM;Right;Left;Supine;10 reps    General Comments General comments (skin integrity, edema, etc.): hygiene and pericare performed due to saturation of urine      Pertinent Vitals/Pain Pain Assessment: Faces Faces Pain Scale: Hurts little more Pain Descriptors / Indicators: Discomfort Pain Intervention(s): Monitored during session    Home Living                      Prior Function            PT Goals (current goals can now be found in the care plan section) Progress towards PT goals: Progressing toward goals(modest progression, improvements RUE)    Frequency    Min 3X/week      PT Plan Frequency needs to be updated    Co-evaluation              AM-PAC PT "6 Clicks" Mobility   Outcome  Measure  Help needed turning from your back to your side while in a flat bed without using bedrails?: A Lot Help needed moving from lying on your back to sitting on the side of a flat bed without using bedrails?: Total Help needed moving to and from a bed to a chair (including a wheelchair)?: Total Help needed standing up from a chair using your arms (e.g., wheelchair or bedside chair)?: Total Help needed to walk in hospital room?: Total Help needed climbing 3-5 steps with a railing? : Total 6 Click Score: 7    End of Session Equipment Utilized During Treatment: Oxygen Activity Tolerance: Patient tolerated treatment well Patient left: in bed;with  call bell/phone within reach;with SCD's reapplied(in chair position) Nurse Communication: Mobility status PT Visit Diagnosis: Other symptoms and signs involving the nervous system (Q65.784(R29.898)     Time: [id number] PT Time Calculation (min) (ACUTE ONLY): 24 min  Charges:  $Therapeutic Exercise: 8-22 mins $Therapeutic Activity: 8-22 mins                     Jesse [name], PT DPT  Board Certified Neurologic Specialist Acute Rehabilitation Services Pager [phone] Office [phone]    Fabio AsaDevon J Melaysia Streed 07/12/2018, 4:58 PM

## 2018-07-12 NOTE — Progress Notes (Signed)
Discussed with PCCM Celine Mans regarding trach exchange to smaller size;Cuffless. Will do exchange tomorrow.

## 2018-07-12 NOTE — Progress Notes (Signed)
Assisted tele visit to patient with family member.  Daffney Greenly Parker, RN,BSN,CCRN 

## 2018-07-12 NOTE — Progress Notes (Signed)
Assisted tele visit to patient with father.  Rayleigh Gillyard Ann, RN  

## 2018-07-12 NOTE — Progress Notes (Signed)
PROGRESS NOTE  Jesse [name] UJW:[id number] DOB: July 31, 1990 DOA: 06/13/2018 PCP: No primary care provider on file.  HPI/Recap of past 13 hours: 28 year old male who was found down in his vehicle, unresponsive, suspected postictal. He had several unmarked pills around him. Patient did not respond to naloxone. While at the emergency department he required intubation for airway protection. Upon admission to the intensive care unit he was hypotensive, required vasopressors.  He had a prolonged hospital stay, possible anoxic brain injury.  He was diagnosed with staph aureus and Klebsiella pneumonia. He required tracheostomy April 30, in orderto be liberated from mechanical ventilation. Hospital course complicated by encephalopathy and fever of unknown etiology for which infectious disease was consulted.  Fever thought to be central.   Mother requested transfer to [facility] to continue neuro therapy.  Was accepted at [facility] acute unit pending medical clearance and PEG tube placement.  Patient has been afebrile in the last 72 hours.  Vital signs and labs reviewed and are stable.  PEG tube placed by interventional radiology on 07/11/2018.  Case manager following closely and updating regularly.  07/12/18: Patient seen and examined at his bedside.  No acute events overnight.  Afebrile in the last 72 hours with no antibiotics.  Per interventional radiology okay to use PEG tube.  Dietitian consulted to initiate PEG tube feeding.    Assessment/Plan: Active Problems:   Altered mental status   Acute respiratory failure with hypoxia (HCC)   Status post tracheostomy (HCC)   Drug overdose   Shock (HCC)   Goals of care, counseling/discussion   Palliative care by specialist   AKI (acute kidney injury) (HCC)   Tachypnea   Acute blood loss anemia   Drug addiction (HCC)   Anoxic brain injury (HCC)   Palliative care encounter  Acute hypoxic and hypercapnic respiratory failure due to  encephalopathy, possible anoxic brain injury.  Tolerating trach collar well.   Continue trach suctioning as needed Continue Keppra for seizure prophylaxis Mother has requested transfer to [facility].  Accepted to [facility] acute unit on 07/08/18 pending PEG tube placement and medical clearance. Has remained afebrile in the last 72 hours off antibiotics O2 saturation stable on trach collar  Resolving sepsis secondary to Klebsiella ESBL HCAP  Leukocytosis, fever, tachycardia, tachypnea have resolved  Completed 12 days of meropenem  Blood cultures x2- to date No signs of active infective process Vital signs and labs reviewed and are stable.  Dysphagia possibly 2/2 to presumed anoxic brain injury PEG tube placed 07/11/2018 by interventional radiology Dietitian consulted to initiate PEG tube feeding Keep head of bed elevated greater than 35 degree to prevent aspiration  Resolved fever of unknown etiology with suspicion for central fever Off antibiotics since 07/08/2018 per infectious disease Remains afebrile  Elevated AST ALT Mild elevation of AST and ALT are trending down Alkaline phosphatase and bilirubin are normal Acute hepatitis panel negative No acute findings on CT abdomen and pelvis without contrast done on 07/08/2018  Mildly elevated lipase level Lipase on 07/11/18 was 71 from 65 on 07/09/2018 Asymptomatic with no tenderness on palpation on exam No acute abnormalities on CT abdomen and pelvis without contrast done on 07/08/2018  Resolved urinary retention. Condom cath in place with good urine output Currently on bethanechol 10 mg 3 times daily Net I&O -12.7L since admission  Polysubstance abuse including cocaine UDS done on 06/13/2018+ for cocaine and benzodiazepine  Chronic depression and anxiety: Continue antidepressant Celexa, gabapentin, and Valium as needed  ADHD: Continue to hold  off Vyvanse due to concern for lowering seizure threshold    DVT prophylaxis: Lovenox  subQ Code Status: Full Family Communication:  Called mother on 07/10/2018 to give updates. Disposition Plan:  Plan transfer to [facility] once PEG tube is placed and patient is medically cleared, possibly Wednesday.     Objective: Vitals:   07/12/18 0446 07/12/18 0801 07/12/18 0813 07/12/18 1127  BP:  109/73 114/71   Pulse:  97 98 (!) 114  Resp:  Temp:   99.2 F (37.3 C)   TempSrc:   Oral   SpO2:  97% 93% 93%  Weight: 77.7 kg     Height:        Intake/Output Summary (Last 24 hours) at 07/12/2018 1157 Last data filed at 07/12/2018 1100 Gross per 24 hour  Intake -  Output 1350 ml  Net -1350 ml   Filed Weights   07/10/18 0500 07/11/18 0500 07/12/18 0446  Weight: 77.7 kg 77.7 kg 77.7 kg    Exam:  . General: 28 y.o. year-old male well developed well-nourished in no acute distress.  Trach collar in place.  Follows commands although still significantly weak throughout. . Cardiovascular: Regular rate and rhythm with no rubs or gallops.  No JVD or thyromegaly noted. Marland Kitchen Respiratory: Clear to auscultation with no wheezes or rales.  Poor inspiratory effort.   . Abdomen: Soft nontender nondistended with normal bowel sounds x4 quadrants.  PEG tube in place. . Musculoskeletal: No lower extremity edema.  2 out of 4 pulses in all 4 extremities.  Data Reviewed: CBC: Recent Labs  Lab 07/06/18 0159 07/08/18 0736 07/11/18 0717  WBC 10.3 8.7 9.9  NEUTROABS  --  6.0 6.4  HGB 14.4 13.4 15.9  HCT 44.1 41.6 46.8  MCV 93.0 93.7 90.2  PLT 345 235 247   Basic Metabolic Panel: Recent Labs  Lab 07/06/18 0159 07/08/18 0736 07/09/18 0532 07/11/18 0717  NA 145 145 144 143  K 3.9 3.8 3.7 4.5  CL 111 108 107 108  CO2 GLUCOSE 125* 125* 117* 94  BUN 31* 31* 28* 32*  CREATININE 0.70 0.71 0.65 0.78  CALCIUM 9.3 9.1 9.3 10.0   GFR: Estimated Creatinine Clearance: 154.9 mL/min (by C-G formula based on SCr of 0.78 mg/dL). Liver Function Tests: Recent Labs  Lab 07/08/18  0736 07/09/18 0532 07/11/18 0717  AST 55* 57* 52*  ALT 74* 75* 67*  ALKPHOS 68 70 69  BILITOT 0.7 0.7 1.2  PROT 7.0 6.7 7.8  ALBUMIN 3.0* 3.0* 3.5   Recent Labs  Lab 07/09/18 0532 07/11/18 0717  LIPASE 65* 71*  AMYLASE 67  --    No results for input(s): AMMONIA in the last 168 hours. Coagulation Profile: Recent Labs  Lab 07/08/18 0736  INR 1.3*   Cardiac Enzymes: No results for input(s): CKTOTAL, CKMB, CKMBINDEX, TROPONINI in the last 168 hours. BNP (last 3 results) No results for input(s): PROBNP in the last 8760 hours. HbA1C: No results for input(s): HGBA1C in the last 72 hours. CBG: Recent Labs  Lab 07/11/18 1553 07/11/18 1946 07/11/18 2349 07/12/18 0358 07/12/18 0810  GLUCAP 80 84 81 84 81   Lipid Profile: No results for input(s): CHOL, HDL, LDLCALC, TRIG, CHOLHDL, LDLDIRECT in the last 72 hours. Thyroid Function Tests: No results for input(s): TSH, T4TOTAL, FREET4, T3FREE, THYROIDAB in the last 72 hours. Anemia Panel: No results for input(s): VITAMINB12, FOLATE, FERRITIN, TIBC, IRON, RETICCTPCT in the last 72 hours. Urine analysis:  Component Value Date/Time   COLORURINE YELLOW 07/05/2018 1200   APPEARANCEUR HAZY (A) 07/05/2018 1200   LABSPEC 1.032 (H) 07/05/2018 1200   PHURINE 7.0 07/05/2018 1200   GLUCOSEU NEGATIVE 07/05/2018 1200   HGBUR MODERATE (A) 07/05/2018 1200   BILIRUBINUR NEGATIVE 07/05/2018 1200   KETONESUR 5 (A) 07/05/2018 1200   PROTEINUR 30 (A) 07/05/2018 1200   NITRITE NEGATIVE 07/05/2018 1200   LEUKOCYTESUR NEGATIVE 07/05/2018 1200   Sepsis Labs: (procalcitonin:4,lacticidven:4)  ) Recent Results (from the past 240 hour(s))  Culture, Urine     Status: None   Collection Time: 07/05/18  9:41 AM  Result Value Ref Range Status   Specimen Description URINE, RANDOM  Final   Special Requests Normal  Final   Culture   Final    NO GROWTH Performed at [facility], [address] [place], [place] [address]     Report Status 07/06/2018 FINAL  Final  Expectorated sputum assessment w rflx to resp cult     Status: None   Collection Time: 07/05/18  9:41 AM  Result Value Ref Range Status   Specimen Description EXPECTORATED SPUTUM  Final   Special Requests Normal  Final   Sputum evaluation   Final    THIS SPECIMEN IS ACCEPTABLE FOR SPUTUM CULTURE Performed at [facility], [address] [place], [place] [address]    Report Status 07/05/2018 FINAL  Final  Culture, respiratory     Status: None   Collection Time: 07/05/18  9:41 AM  Result Value Ref Range Status   Specimen Description EXPECTORATED SPUTUM  Final   Special Requests Normal Reflexed from [id number]  Final   Gram Stain   Final    MODERATE WBC PRESENT, PREDOMINANTLY PMN RARE SQUAMOUS EPITHELIAL CELLS PRESENT NO ORGANISMS SEEN Performed at [facility], [address], [place], [place] [address]    Culture FEW CANDIDA ALBICANS  Final   Report Status 07/07/2018 FINAL  Final  Culture, blood (routine x 2)     Status: None   Collection Time: 07/05/18 10:00 AM  Result Value Ref Range Status   Specimen Description BLOOD RIGHT HAND  Final   Special Requests   Final    BOTTLES DRAWN AEROBIC AND ANAEROBIC Blood Culture adequate volume   Culture   Final    NO GROWTH 5 DAYS Performed at [facility], [address], [place], [place] [address]    Report Status 07/10/2018 FINAL  Final  Culture, blood (routine x 2)     Status: None   Collection Time: 07/05/18 10:15 AM  Result Value Ref Range Status   Specimen Description BLOOD LEFT HAND  Final   Special Requests   Final    BOTTLES DRAWN AEROBIC AND ANAEROBIC Blood Culture adequate volume   Culture   Final    NO GROWTH 5 DAYS Performed at [facility], [address], [place], [place] [address]    Report Status 07/10/2018 FINAL  Final  SARS Coronavirus 2 (CEPHEID - Performed in [facility] hospital lab), Hosp Order     Status: None   Collection Time: 07/05/18 10:55 AM   Result Value Ref Range Status   SARS Coronavirus 2 NEGATIVE NEGATIVE Final    Comment: (NOTE) If result is NEGATIVE SARS-CoV-2 target nucleic acids are NOT DETECTED. The SARS-CoV-2 RNA is generally detectable in upper and lower  respiratory specimens during the acute phase of infection. The lowest  concentration of SARS-CoV-2 viral copies this assay  can detect is 250  copies / mL. A negative result does not preclude SARS-CoV-2 infection  and should not be used as the sole basis for treatment or other  patient management decisions.  A negative result may occur with  improper specimen collection / handling, submission of specimen other  than nasopharyngeal swab, presence of viral mutation(s) within the  areas targeted by this assay, and inadequate number of viral copies  (<250 copies / mL). A negative result must be combined with clinical  observations, patient history, and epidemiological information. If result is POSITIVE SARS-CoV-2 target nucleic acids are DETECTED. The SARS-CoV-2 RNA is generally detectable in upper and lower  respiratory specimens dur ing the acute phase of infection.  Positive  results are indicative of active infection with SARS-CoV-2.  Clinical  correlation with patient history and other diagnostic information is  necessary to determine patient infection status.  Positive results do  not rule out bacterial infection or co-infection with other viruses. If result is PRESUMPTIVE POSTIVE SARS-CoV-2 nucleic acids MAY BE PRESENT.   A presumptive positive result was obtained on the submitted specimen  and confirmed on repeat testing.  While 2019 novel coronavirus  (SARS-CoV-2) nucleic acids may be present in the submitted sample  additional confirmatory testing may be necessary for epidemiological  and / or clinical management purposes  to differentiate between  SARS-CoV-2 and other Sarbecovirus currently known to infect humans.  If clinically indicated additional  testing with an alternate test  methodology [phone]([id number]) is advised. The SARS-CoV-2 RNA is generally  detectable in upper and lower respiratory sp ecimens during the acute  phase of infection. The expected result is Negative. Fact Sheet for Patients:  BoilerBrush.com.cyhttps://www.fda.gov/media/136312/download Fact Sheet for Healthcare Providers: https://pope.com/https://www.fda.gov/media/136313/download This test is not yet approved or cleared by the [place] FDA and has been authorized for detection and/or diagnosis of SARS-CoV-2 by FDA under an Emergency Use Authorization (EUA).  This EUA will remain in effect (meaning this test can be used) for the duration of the COVID-19 declaration under Section 564(b)(1) of the Act, 21 U.S.C. section 360bbb-3(b)(1), unless the authorization is terminated or revoked sooner. Performed at [facility], [address] [place], [place] [address]   MRSA PCR Screening     Status: None   Collection Time: 07/05/18  1:00 PM  Result Value Ref Range Status   MRSA by PCR NEGATIVE NEGATIVE Final    Comment:        The GeneXpert MRSA Assay (FDA approved for NASAL specimens only), is one component of a comprehensive MRSA colonization surveillance program. It is not intended to diagnose MRSA infection nor to guide or monitor treatment for MRSA infections. Performed at [facility], [address] [place], [place] [address]       Studies: Ir Gastrostomy Tube Mod Sed  Result Date: 07/11/2018 INDICATION: 28 year old with dysphagia, respiratory failure and encephalopathy. Patient needs a percutaneous gastrostomy tube for nutrition. EXAM: PERCUTANEOUS GASTROSTOMY TUBE WITH FLUOROSCOPIC GUIDANCE Physician: Rachelle HoraAdam R. Lowella DandyHenn, MD MEDICATIONS: Ancef 2 g; Antibiotics were administered within 1 hour of the procedure. ANESTHESIA/SEDATION: Versed 2.0 mg IV; Fentanyl 100 mcg IV Moderate Sedation Time:  29 minutes The patient was continuously monitored during the procedure by the  interventional radiology nurse under my direct supervision. FLUOROSCOPY TIME:  Fluoroscopy Time: 4 minutes, 48 seconds, 9 mGy COMPLICATIONS: None immediate. PROCEDURE: Informed consent was obtained for a percutaneous gastrostomy tube. The patient was placed on the interventional table. Fluoroscopy demonstrated oral contrast in  the transverse colon. An orogastric tube was placed with fluoroscopic guidance. The anterior abdomen was prepped and draped in sterile fashion. Maximal barrier sterile technique was utilized including caps, mask, sterile gowns, sterile gloves, sterile drape, hand hygiene and skin antiseptic. Stomach was inflated with air through the orogastric tube. The skin and subcutaneous tissues were anesthetized with 1% lidocaine. A 17 gauge needle was directed into the distended stomach with fluoroscopic guidance. A wire was advanced into the stomach and a T-tact was deployed. A 9-French vascular sheath was placed and the orogastric tube was snared using a Gooseneck snare device. The orogastric tube and snare were pulled out of the patient's mouth. The snare device was connected to a 20-French gastrostomy tube. The snare device and gastrostomy tube were pulled through the patient's mouth and out the anterior abdominal wall. The gastrostomy tube was cut to an appropriate length. Contrast injection through gastrostomy tube confirmed placement within the stomach. Fluoroscopic images were obtained for documentation. The gastrostomy tube was flushed with normal saline. IMPRESSION: Successful fluoroscopic guided percutaneous gastrostomy tube placement. Electronically Signed   By: Richarda Overlie M.D.   On: 07/11/2018 16:57    Scheduled Meds: . bethanechol  10 mg Per Tube TID  . chlorhexidine  15 mL Mouth Rinse BID  . [START ON 07/13/2018] citalopram  30 mg Per Tube Daily  . [START ON 07/13/2018] enoxaparin (LOVENOX) injection  40 mg Subcutaneous Q24H  . feeding supplement (PRO-STAT SUGAR FREE 64)  30 mL Per  Tube Daily  . gabapentin  300 mg Per Tube QPM  . mouth rinse  15 mL Mouth Rinse q12n4p  . propranolol  40 mg Per Tube Q2000    Continuous Infusions: . sodium chloride 250 mL (07/06/18 1041)  . feeding supplement (OSMOLITE 1.5 CAL) Stopped (07/11/18 0700)  . levETIRAcetam 500 mg (07/12/18 0752)     LOS: 29 days     Darlin Drop, MD [facility] Pager [phone]  If 7PM-7AM, please contact night-coverage [email or web address] Password [email or web address] 07/12/2018, 11:57 AM

## 2018-07-12 NOTE — Care Management (Signed)
Notified by [facility] that insurance has approved pt for acute inpatient rehab.  Facility requesting that smaller, uncuffed trach be put in prior to pt arrival.  Notified Dr. Margo Aye of pt acceptance; she will notify CCM of need for uncuffed trach.  Dr. Margo Aye prefers to monitor pt for next 24h due to spike in temp and HR this afternoon, per RN, and transition to PEG feedings.  Will tentatively plan discharge to [facility] for Thursday, May 21.    Per MD pt will need ALS transport to [facility] facility.  I have spoken with [facility] as a possible option, but their director states currently they are quite overwhelmed with COVID transports, and are unlikely to be able to staff for a longer distance transport.  I spoke with [employer], and they can provide ALS and staff the case without difficulty.  They also can bill pt's insurance for transport, which has been a concern for pt's mother.    I discussed options of transport with pt's mother, and she prefers to use [facility].  Will follow up in AM to check pt stability, and book case accordingly.    Quintella Baton, RN, BSN  Trauma/Neuro ICU Case Manager [phone]

## 2018-07-13 DIAGNOSIS — Z930 Tracheostomy status: Secondary | ICD-10-CM

## 2018-07-13 DIAGNOSIS — L8990 Pressure ulcer of unspecified site, unspecified stage: Secondary | ICD-10-CM

## 2018-07-13 LAB — BASIC METABOLIC PANEL
Anion gap: 12 (ref 5–15)
BUN: 28 mg/dL — ABNORMAL HIGH (ref 6–20)
CO2: 24 mmol/L (ref 22–32)
Calcium: 9.5 mg/dL (ref 8.9–10.3)
Chloride: 110 mmol/L (ref 98–111)
Creatinine, Ser: 0.72 mg/dL (ref 0.61–1.24)
GFR calc Af Amer: >60 mL/min (ref >60)
GFR calc non Af Amer: >60 mL/min (ref >60)
Glucose, Bld: 115 mg/dL — ABNORMAL HIGH (ref 70–99)
Potassium: 3.6 mmol/L (ref 3.5–5.1)
Sodium: 146 mmol/L — ABNORMAL HIGH (ref 135–145)

## 2018-07-13 LAB — CBC WITH DIFFERENTIAL/PLATELET
Abs Immature Granulocytes: 0.03 10*3/uL (ref 0.00–0.07)
Basophils Absolute: 0 10*3/uL (ref 0.0–0.1)
Basophils Relative: 0 %
Eosinophils Absolute: 0.2 10*3/uL (ref 0.0–0.5)
Eosinophils Relative: 2 %
HCT: 42.1 % (ref 39.0–52.0)
Hemoglobin: 14.3 g/dL (ref 13.0–17.0)
Immature Granulocytes: 0 %
Lymphocytes Relative: 26 %
Lymphs Abs: 2.6 10*3/uL (ref 0.7–4.0)
MCH: 30.6 pg (ref 26.0–34.0)
MCHC: 34 g/dL (ref 30.0–36.0)
MCV: 90 fL (ref 80.0–100.0)
Monocytes Absolute: 0.7 10*3/uL (ref 0.1–1.0)
Monocytes Relative: 7 %
Neutro Abs: 6.2 10*3/uL (ref 1.7–7.7)
Neutrophils Relative %: 65 %
Platelets: 244 10*3/uL (ref 150–400)
RBC: 4.68 MIL/uL (ref 4.22–5.81)
RDW: 12.6 % (ref 11.5–15.5)
WBC: 9.7 10*3/uL (ref 4.0–10.5)
nRBC: 0 % (ref 0.0–0.2)

## 2018-07-13 LAB — GLUCOSE, CAPILLARY
Glucose-Capillary: 88 mg/dL (ref 70–99)
Glucose-Capillary: 102 mg/dL — ABNORMAL HIGH (ref 70–99)
Glucose-Capillary: 100 mg/dL — ABNORMAL HIGH (ref 70–99)
Glucose-Capillary: 102 mg/dL — ABNORMAL HIGH (ref 70–99)
Glucose-Capillary: 107 mg/dL — ABNORMAL HIGH (ref 70–99)

## 2018-07-13 MED ORDER — OSMOLITE 1.5 CAL PO LIQD
1000.0000 mL | ORAL | Status: DC
  Administered 2018-07-13: 1000 mL
  Filled 2018-07-13 (×3): qty 1000

## 2018-07-13 MED ORDER — PRO-STAT SUGAR FREE PO LIQD
30.0000 mL | Freq: Every day | ORAL | Status: DC
  Administered 2018-07-14: 10:00:00 30 mL
  Filled 2018-07-13: qty 30

## 2018-07-13 NOTE — Progress Notes (Signed)
  Speech Language Pathology Treatment: Dysphagia;Cognitive-Linquistic  Patient Details Name: Jesse [name] MRN: [id number] DOB: 01-06-1991 Today's Date: 07/13/2018 Time: 1000-1035 SLP Time Calculation (min) (ACUTE ONLY): 35 min  Assessment / Plan / Recommendation Clinical Impression  Collaboration with PT/OT for optimal upright position, trunk and head control and sustain alert state. Followed commands with repetition and additional response time to open mouth and protrude tongue. Was unable to activate CN VII for facial/labial retraction or protrusion and passive ROM provided. Responded with eye blinks to yes/no (one=yes, two=no) inquiries re: environmental surroundings. Beginning to raise right hand but lacks stimulation/dexterity for thumbs up. Critical care NP arrived and changed trach from # 8 cuffed to a # 6 cuffless. This allowed exhaled air to reach upper airway with sensed exhalation on therapist's hand and PMV remained on trach hub for 15 min (no cough, back pressure or respiratory difficulties). Minimal audible vocalization during reflexive throat clear. Verbal cues to imitate humming, syllables with automatics without initiation of voice or mouthing of words.   OT provided max-total assist for hand over hand self feeding. Jesse [name] opened mouth to accept ice chips, although minimal he masticated ice. Oral holding versus late pharyngeal initiation with 3 visible swallows with 3 trials. Trace delayed throat clear. Plans to transfer to [facility] tomorrow to continue therapies including ST for communication, cognition, speech and swallow. He has made progress this week since being afebrile.     HPI HPI: 28 year old male found down in car with suspected drug overdose versus status epilepticus. Intubated 4/20 and trach'd 4/30. MRI showing multiple areas of restricted diffusion, predominantly in the cerebellar hemispheres which might be representative of hypoxic/anoxic injury but specifically in  bilateral globus pallidus and lentiform nuclei on the right and Petechial hemorrhage within the right basal ganglia without mass effect. Past medical history of alcohol and drug abuse      SLP Plan  Continue with current plan of care       Recommendations  Diet recommendations: NPO Medication Administration: Via alternative means      Patient may use Passy-Muir Speech Valve: (RN, RT, therapy) PMSV Supervision: Full         Oral Care Recommendations: Oral care QID Follow up Recommendations: Inpatient Rehab SLP Visit Diagnosis: Aphonia (R49.1);Cognitive communication deficit (R41.841);Dysphagia, unspecified (R13.10) Plan: Continue with current plan of care       GO                Jesse [name] 07/13/2018, 2:31 PM  Jesse [name] Jesse [name].Ed Nurse, children's [phone] Office [phone]

## 2018-07-13 NOTE — Progress Notes (Addendum)
Bedside swallow evaluation    07/08/18 1115  SLP Visit Information  SLP Received On 07/08/18  General Information  HPI 28 year old male found down in car with suspected drug overdose versus status epilepticus. Intubated 4/20 and trach'd 4/30. 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 Past medical history of alcohol and drug abuse  Type of Study Bedside Swallow Evaluation  Previous Swallow Assessment  (none)  Diet Prior to this Study NPO;NG Tube  Temperature Spikes Noted Yes  Respiratory Status Trach;Trach Collar  Trach Size and Type #6;Cuff;Deflated  History of Recent Intubation Yes  Length of Intubations (days) 10 days  Date extubated  (trach 4/30)  Behavior/Cognition Alert;Cooperative;Distractible;Requires cueing  Oral Cavity Assessment Other (comment) (unable to fully view)  Oral Care Completed by SLP Yes  Oral Cavity - Dentition Adequate natural dentition  Vision Functional for self-feeding  Self-Feeding Abilities Total assist  Patient Positioning Upright in bed  Baseline Vocal Quality Other (comment) (not currently tolerating pmv)  Volitional Cough Other (Comment) (uanble on command, reflexive strong)  Volitional Swallow Unable to elicit  Pain Assessment  Pain Assessment Faces  Faces Pain Scale 4  Pain Intervention(s) Monitored during session;Repositioned  Oral Assessment (Complete on admission/transfer/change in patient condition)  Does patient have any of the following "high(er) risk" factors? Tracheostomy with trach collar 24 hrs./day  Patient is HIGH RISK: Non-ventilated Order set for Adult Oral Care Protocol initiated - "High Risk Patients - Non-Ventilated" option selected  (see row information)  Oral  Motor/Sensory Function  Overall Oral Motor/Sensory Function Generalized oral weakness  Ice Chips  Ice chips Impaired  Presentation Spoon  Oral Phase Impairments Reduced labial seal;Reduced lingual movement/coordination  Oral Phase Functional Implications Prolonged oral transit  Pharyngeal Phase Impairments  (no overt)  Thin Liquid  Thin Liquid NT  Nectar Thick Liquid  Nectar Thick Liquid NT  Honey Thick Liquid  Honey Thick Liquid NT  Puree  Puree NT  Solid  Solid NT  SLP - End of Session  Patient left in bed;with nursing in room  Nurse Communication Treatment plan;Diet recommendation  SLP Assessment  Clinical Impression Statement (ACUTE ONLY) Following oral care pt accepted single ice chips with decreased labial seal. Although movement was reduced, [name] demonstrated rotary mastication and prolonged.     SLP Visit Diagnosis Dysphagia, unspecified (R13.10)  Impact on safety and function Moderate aspiration risk;Severe aspiration risk  Other Related Risk Factors Cognitive impairment;Decreased respiratory status;Decreased management of secretions;Other (comment) (anoxic head injury)  Swallow Evaluation Recommendations  SLP Diet Recommendations NPO  Medication Administration Via alternative means  Treatment Plan  Oral Care Recommendations Oral care QID  Treatment Recommendations Therapy as outlined in treatment plan below  Follow up Recommendations Inpatient Rehab  Speech Therapy Frequency (ACUTE ONLY) min 2x/week  Treatment Duration 2 weeks  Interventions Trials of upgraded texture/liquids;Patient/family education  Prognosis  Prognosis for Safe Diet Advancement Good  Barriers to Reach Goals Cognitive deficits;Severity of deficits  Individuals Consulted  Consulted and Agree with Results and Recommendations Patient;RN  Progression Toward Goals  Potential to Achieve Goals (ACUTE ONLY) Good  Potential Considerations (ACUTE ONLY) Severity of impairments;Co-morbidities  SLP  Time Calculation  SLP Start Time (ACUTE ONLY) 1104  SLP Stop Time (ACUTE ONLY) 1113  SLP Time Calculation (min) (ACUTE ONLY) 9 min  SLP Evaluations  $  SLP Speech Visit 1 Visit  SLP Evaluations  $BSS Swallow 1 Procedure   Breck CoonsLisa Willis Indian LakeLitaker M.Ed Nurse, children'sCCC-SLP Speech-Language Pathologist Pager [phone] Office [phone]

## 2018-07-13 NOTE — Progress Notes (Signed)
Assisted tele visit to patient with mother.  Denishia Citro M Rudine Rieger, RN   

## 2018-07-13 NOTE — Procedures (Signed)
Tracheostomy tube change: Verbal timeout was performed prior to the procedure. The old  #8 cuffed trach was carefully removed. the tracheostomy site appeared: unremarkable. A new # 6 Cuffless trach was easily placed in the tracheostomy stoma and secured with velcro trach ties. The tracheostomy was patent, good color change observed via EZ-CAP, and the patient was easily able to voice with finger occlusion and tolerated the procedure well with no immediate complications.    Simonne Martinet ACNP-BC [facility] Pulmonary/Critical Care Pager # [phone] OR # [phone] if no answer

## 2018-07-13 NOTE — Progress Notes (Signed)
Occupational Therapy Treatment Patient Details Name: Jesse BaliMichael David [name] MRN: [id number] DOB: 11/29/1990 Today's Date: 07/13/2018    History of present illness 28 year old male found down in car with suspected drug overdose versus status epilepticus. Intubated 4/20. MRI showing multiple areas of restricted diffusion, predominantly in the cerebellar hemispheres which might be representative of hypoxic/anoxic injury but specifically in bilateral globus pallidus and lentiform nuclei on the right. No significant medical past history but history of alcohol and drug abuse.    OT comments  Pt agreeable to participate in therapy. Session performs with PT and SLP. Pt with increased active movement of RUE; continues to present with weakness at shoulder. Pt washing his face at bed level with Max hand over hand. Pt performing bed mobility to sit at EOB with Max-Total A. While sitting at EOB with Max support, pt participating in self feeding task requiring Max hand over hand to bring spoon to mouth. Pt using blinks for yes/no question with ~50% consistency. Continue to recommend inpatient rehab and will continue to follow acutely as admitted.    Follow Up Recommendations  CIR;Supervision/Assistance - 24 hour    Equipment Recommendations  None recommended by OT    Recommendations for Other Services Rehab consult    Precautions / Restrictions Precautions Precautions: Fall Precaution Comments: trach       Mobility Bed Mobility Overal bed mobility: Needs Assistance Bed Mobility: Rolling;Supine to Sit;Sit to Supine     Supine to sit: Max assist;+2 for physical assistance Sit to supine: +2 for physical assistance;Total assist   General bed mobility comments: Helicopter technique to come to upright at EOB and assist to return to supine and reposition.   Transfers                 General transfer comment: EOB focus, then positioned in chair position in bed    Balance Overall balance  assessment: Needs assistance Sitting-balance support: Feet supported Sitting balance-Leahy Scale: Poor Sitting balance - Comments: modest trunk and cervical engagement but requires hands on assist throughout session with posterior support                                   ADL either performed or assessed with clinical judgement   ADL Overall ADL's : Needs assistance/impaired Eating/Feeding: Maximal assistance;Sitting Eating/Feeding Details (indicate cue type and reason): With Max support sitting at EOB. Pt participating in self feeding task with SLP present for swallowing. Pt attempting to hold spoon for ice chips; required Max hand over hand to maintain grasp and then bring to his mouth.  Grooming: Wash/dry face;Maximal assistance;Bed level Grooming Details (indicate cue type and reason): Pt requiring Max hand over hand to wash his face                               General ADL Comments: Pt motivated to eat ice chips     Vision       Perception     Praxis      Cognition Arousal/Alertness: Awake/alert Behavior During Therapy: Flat affect Overall Cognitive Status: Impaired/Different from baseline Area of Impairment: Attention;Following commands                   Current Attention Level: Focused   Following Commands: Follows one step commands inconsistently;Follows one step commands with increased time   Awareness: Intellectual Problem Solving: Slow  processing;Decreased initiation;Requires tactile cues;Requires verbal cues General Comments: Pt tracking therapist with eyes. Noting decreased attention and pt requiring quiet environement. Requiring increased time and cues for following commands. Pt during eye blinks to communicate (however, consistant ~50% of time).        Exercises Exercises: General Upper Extremity General Exercises - Upper Extremity Shoulder Flexion: PROM;Right;10 reps;Supine Shoulder Extension: PROM;Right;10  reps;Supine Elbow Flexion: PROM;Right;10 reps;Supine Elbow Extension: PROM;Right;10 reps;Supine Wrist Flexion: PROM;Right;10 reps;Supine Wrist Extension: PROM;Right;10 reps;Supine Digit Composite Flexion: PROM;Right;10 reps;Supine Composite Extension: PROM;Right;10 reps;Supine   Shoulder Instructions       General Comments VSS    Pertinent Vitals/ Pain       Faces Pain Scale: Hurts little more Pain Location: Pt repeatedly moves Rt hand over abdomen.  When asked if he had pain, he blinked for "yes" response, and "yes" for bellly - unsure of reliability.    Pain Descriptors / Indicators: Discomfort  Home Living                                          Prior Functioning/Environment              Frequency  Min 3X/week        Progress Toward Goals  OT Goals(current goals can now be found in the care plan section)  Progress towards OT goals: Progressing toward goals  Acute Rehab OT Goals Patient Stated Goal: unable to state OT Goal Formulation: Patient unable to participate in goal setting Time For Goal Achievement: 07/18/18 Potential to Achieve Goals: Good ADL Goals Pt Will Perform Grooming: with min assist;bed level;sitting Pt/caregiver will Perform Home Exercise Program: Increased ROM;Increased strength;With minimal assist;Both right and left upper extremity Additional ADL Goal #1: Pt will tolerate seated position for 5 minutes with Mod A Additional ADL Goal #2: Pt will perform bed mobility with Mod A +2 in preparation for ADLs  Plan Discharge plan remains appropriate    Co-evaluation    PT/OT/SLP Co-Evaluation/Treatment: Yes(with PT and SLP) Reason for Co-Treatment: Complexity of the patient's impairments (multi-system involvement);For patient/therapist safety;Necessary to address cognition/behavior during functional activity   OT goals addressed during session: ADL's and self-care      AM-PAC OT "6 Clicks" Daily Activity     Outcome  Measure   Help from another person eating meals?: Total Help from another person taking care of personal grooming?: A Lot Help from another person toileting, which includes using toliet, bedpan, or urinal?: Total Help from another person bathing (including washing, rinsing, drying)?: Total Help from another person to put on and taking off regular upper body clothing?: Total Help from another person to put on and taking off regular lower body clothing?: Total 6 Click Score: 7    End of Session Equipment Utilized During Treatment: Oxygen  OT Visit Diagnosis: Muscle weakness (generalized) (M62.81);Other symptoms and signs involving cognitive function   Activity Tolerance Patient limited by fatigue   Patient Left in bed;with bed alarm set;with call bell/phone within reach(In chair position)   Nurse Communication Mobility status        Time: 2585-2778 OT Time Calculation (min): 57 min  Charges: OT General Charges $OT Visit: 1 Visit OT Treatments $Self Care/Home Management : 8-22 mins  Prabhleen Montemayor MSOT, OTR/L Acute Rehab Pager: [phone] Office: [phone]   Theodoro Grist Pavneet Markwood 07/13/2018, 5:51 PM

## 2018-07-13 NOTE — Care Management (Signed)
Transportation to [facility] has been booked for tomorrow, 07/14/18 at 1:00pm by [employer]. (Phone:  [phone]).  Updated attending MD and pt's mother.  Pt's parents to meet pt at rehab facility upon his arrival.    Quintella Baton, RN, BSN  Trauma/Neuro ICU Case Manager [phone]

## 2018-07-13 NOTE — Progress Notes (Signed)
Nutrition Follow-up   RD working remotely.  DOCUMENTATION CODES:   Not applicable  INTERVENTION:  Increase Osmolite 1.5 formula via PEG by 10 ml every 4 hours to goal rate of 65 ml/hr.   Provide 30 ml Prostat once daily.   Tube feeding regimen to provide 2440kcal (100% of needs),113grams of protein, and of H2O.   NUTRITION DIAGNOSIS:   Inadequate oral intake related to inability to eat as evidenced by NPO status; ongoing  GOAL:   Patient will meet greater than or equal to 90% of their needs; progressing  MONITOR:   Labs, Weight trends, I & O's, Skin, TF tolerance  REASON FOR ASSESSMENT:   Consult, Ventilator Enteral/tube feeding initiation and management  ASSESSMENT:   Pt with PMH of substance abuse and prior suicide attempt admitted 4/20 with suspected drug overdose (UDS positive on admission for amphetamines, cocaine, and benzos), suspected RLL aspiration PNA, and AKI after being found down in his car outside of a hotel.   Tracheostomy placed 4/30. Pt extubated to ATC 5/3. Pt remains NPO. PEG placed 5/18. Ptwith dysphagia and probableanoxic brain injury. Family requested transfer to [facility] for continued neuro therapy. Per MD, possible plans for transfer tomorrow. Noted pt did not tolerate bolus tube feed yesterday as pt had developed a fever and became more tachycardic after PEG placement. Feedings were thus changed to trickle feeds yesterday afternoon to ensure tolerance. Per MD, fever and white blood cell count improving today. RD to modify tube feeds and put in tube feeding advancement orders. RD to continue to monitor for tolerance.   Labs and medications reviewed.   Diet Order:   Diet Order            Diet NPO time specified Except for: Ice Chips  Diet effective now              EDUCATION NEEDS:   No education needs have been identified at this time  Skin:  Skin Assessment: Skin Integrity Issues: Skin Integrity Issues:: Stage II Stage II:  sacrum  Last BM:  5/16  Height:   Ht Readings from Last 1 Encounters:  06/13/18 6' (1.829 m)    Weight:   Wt Readings from Last 1 Encounters:  07/13/18 77.8 kg    Ideal Body Weight:  80.9 kg  BMI:  Body mass index is 23.26 kg/m.  Estimated Nutritional Needs:   Kcal:  2400-2600  Protein:  115-135 grams  Fluid:  > 2 L/day    Roslyn Smiling, MS, RD, LDN Pager # [phone] After hours/ weekend pager # [phone]

## 2018-07-13 NOTE — Progress Notes (Signed)
PROGRESS NOTE  Jesse [name] ZOX:[id number] DOB: 03/04/1990 DOA: 06/13/2018 PCP: No primary care provider on file.  summary 28 year old male who was found down in his vehicle, unresponsive, suspected postictal. He had several unmarked pills around him. Patient did not respond to naloxone.  required intubation for airway protection. Upon admission to the intensive care unit he was hypotensive, required vasopressors.  prolonged hospital stay, ?anoxic brain injury.    staph aureus and Klebsiella pneumonia tracheostomy April 30, in orderto be liberated from mechanical ventilation.   Course complicated by encephalopathy and fever of unknown etiology for which infectious disease was consulted.  Fever thought to be central.   Accepted at [facility] acute unit pending medical clearance and PEG tube placement.   PEG tube placed by interventional radiology on 07/11/2018.  Case manager following closely and updating regularly.   Assessment/Plan: Active Problems:   Altered mental status   Acute respiratory failure with hypoxia (HCC)   Status post tracheostomy (HCC)   Drug overdose   Shock (HCC)   Goals of care, counseling/discussion   Palliative care by specialist   AKI (acute kidney injury) (HCC)   Tachypnea   Acute blood loss anemia   Drug addiction (HCC)   Anoxic brain injury (HCC)   Palliative care encounter   Pressure injury of skin   Tracheostomy dependence (HCC)  Acute hypoxic and hypercapnic respiratory failure due to encephalopathy, possible anoxic brain injury.  Tolerating trach collar well.   Continue trach suctioning as needed Continue Keppra for seizure prophylaxis Accepted to [facility] acute unit on 07/08/18 pending PEG tube placement and medical clearance.  Resolving sepsis secondary to Klebsiella ESBL HCAP  Leukocytosis, fever, tachycardia, tachypnea have resolved  Completed 12 days of meropenem  Blood cultures x2- to date  Dysphagia possibly 2/2 to presumed  anoxic brain injury PEG tube placed 07/11/2018 by interventional radiology Dietitian consulted to initiate PEG tube feeding Keep head of bed elevated greater than 35 degree to prevent aspiration  Resolved fever of unknown etiology with suspicion for central fever Off antibiotics since 07/08/2018 per infectious disease Remains afebrile  Elevated AST ALT Mild elevation of AST and ALT are trending down Alkaline phosphatase and bilirubin are normal Acute hepatitis panel negative No acute findings on CT abdomen and pelvis without contrast done on 07/08/2018  Mildly elevated lipase level Lipase on 07/11/18 was 71 from 65 on 07/09/2018 Asymptomatic with no tenderness on palpation on exam No acute abnormalities on CT abdomen and pelvis without contrast done on 07/08/2018  Resolved urinary retention. Condom cath in place with good urine output Currently on bethanechol 10 mg 3 times daily Net I&O -12.7L since admission  Polysubstance abuse including cocaine UDS done on 06/13/2018+ for cocaine and benzodiazepine  Severe depression: Continue antidepressant Celexa, gabapentin, and Valium as needed  ADHD: Continue to hold off Vyvanse due to concern for lowering seizure threshold    DVT prophylaxis: Lovenox subQ Code Status: Full Family Communication:  Called mother on 5/20 Disposition Plan:  Plan transfer to [facility] once PEG tube is placed and patient is medically cleared, possibly 5/21    Objective: Vitals:   07/13/18 1158 07/13/18 1200 07/13/18 1600 07/13/18 1619  BP:  107/86 113/84   Pulse: 91 91 98 97  Resp: 10 10 12 12   Temp:  98.2 F (36.8 C) 98.1 F (36.7 C)   TempSrc:  Oral Oral   SpO2: 93% 94% 92% 93%  Weight:      Height:  Intake/Output Summary (Last 24 hours) at 07/13/2018 1637 Last data filed at 07/13/2018 1215 Gross per 24 hour  Intake -  Output 700 ml  Net -700 ml   Filed Weights   07/11/18 0500 07/12/18 0446 07/13/18 0426  Weight: 77.7 kg 77.7 kg 77.8  kg    Exam:  Awake blinks Doesn't seem to want to use PMV to talk with me No ict Emaciated Trach in place cta b no adde dsound s1 s 2no m abd soft Reflexes hyperreflexic in knees and BRadialis Skin soft supple--didn't examine sacrum L hand in splint R hand 3/5 power Sensory intact to cold touch  Data Reviewed: CBC: Recent Labs  Lab 07/08/18 0736 07/11/18 0717  WBC 8.7 9.9  NEUTROABS 6.0 6.4  HGB 13.4 15.9  HCT 41.6 46.8  MCV 93.7 90.2  PLT 235 247   Basic Metabolic Panel: Recent Labs  Lab 07/08/18 0736 07/09/18 0532 07/11/18 0717  NA 145 144 143  K 3.8 3.7 4.5  CL 108 107 108  CO2 GLUCOSE 125* 117* 94  BUN 31* 28* 32*  CREATININE 0.71 0.65 0.78  CALCIUM 9.1 9.3 10.0   GFR: Estimated Creatinine Clearance: 154.9 mL/min (by C-G formula based on SCr of 0.78 mg/dL). Liver Function Tests: Recent Labs  Lab 07/08/18 0736 07/09/18 0532 07/11/18 0717  AST 55* 57* 52*  ALT 74* 75* 67*  ALKPHOS 68 70 69  BILITOT 0.7 0.7 1.2  PROT 7.0 6.7 7.8  ALBUMIN 3.0* 3.0* 3.5   Recent Labs  Lab 07/09/18 0532 07/11/18 0717  LIPASE 65* 71*  AMYLASE 67  --    No results for input(s): AMMONIA in the last 168 hours. Coagulation Profile: Recent Labs  Lab 07/08/18 0736  INR 1.3*   Cardiac Enzymes: No results for input(s): CKTOTAL, CKMB, CKMBINDEX, TROPONINI in the last 168 hours. BNP (last 3 results) No results for input(s): PROBNP in the last 8760 hours. HbA1C: No results for input(s): HGBA1C in the last 72 hours. CBG: Recent Labs  Lab 07/12/18 2334 07/13/18 0353 07/13/18 0748 07/13/18 1200 07/13/18 1610  GLUCAP 95 88 102* 100* 102*   Lipid Profile: No results for input(s): CHOL, HDL, LDLCALC, TRIG, CHOLHDL, LDLDIRECT in the last 72 hours. Thyroid Function Tests: No results for input(s): TSH, T4TOTAL, FREET4, T3FREE, THYROIDAB in the last 72 hours. Anemia Panel: No results for input(s): VITAMINB12, FOLATE, FERRITIN, TIBC, IRON, RETICCTPCT  in the last 72 hours. Urine analysis:    Component Value Date/Time   COLORURINE YELLOW 07/05/2018 1200   APPEARANCEUR HAZY (A) 07/05/2018 1200   LABSPEC 1.032 (H) 07/05/2018 1200   PHURINE 7.0 07/05/2018 1200   GLUCOSEU NEGATIVE 07/05/2018 1200   HGBUR MODERATE (A) 07/05/2018 1200   BILIRUBINUR NEGATIVE 07/05/2018 1200   KETONESUR 5 (A) 07/05/2018 1200   PROTEINUR 30 (A) 07/05/2018 1200   NITRITE NEGATIVE 07/05/2018 1200   LEUKOCYTESUR NEGATIVE 07/05/2018 1200   Sepsis Labs: (procalcitonin:4,lacticidven:4)  ) Recent Results (from the past 240 hour(s))  Culture, Urine     Status: None   Collection Time: 07/05/18  9:41 AM  Result Value Ref Range Status   Specimen Description URINE, RANDOM  Final   Special Requests Normal  Final   Culture   Final    NO GROWTH Performed at [facility], [address], [place], [place] [address]    Report Status 07/06/2018 FINAL  Final  Expectorated sputum assessment w rflx to resp cult     Status: None  Collection Time: 07/05/18  9:41 AM  Result Value Ref Range Status   Specimen Description EXPECTORATED SPUTUM  Final   Special Requests Normal  Final   Sputum evaluation   Final    THIS SPECIMEN IS ACCEPTABLE FOR SPUTUM CULTURE Performed at [facility], [address], [place], [place] [address]    Report Status 07/05/2018 FINAL  Final  Culture, respiratory     Status: None   Collection Time: 07/05/18  9:41 AM  Result Value Ref Range Status   Specimen Description EXPECTORATED SPUTUM  Final   Special Requests Normal Reflexed from [id number]  Final   Gram Stain   Final    MODERATE WBC PRESENT, PREDOMINANTLY PMN RARE SQUAMOUS EPITHELIAL CELLS PRESENT NO ORGANISMS SEEN Performed at [facility], [address] [place], [place] [address]    Culture FEW CANDIDA ALBICANS  Final   Report Status 07/07/2018 FINAL  Final  Culture, blood (routine x 2)     Status: None   Collection Time: 07/05/18 10:00 AM  Result Value  Ref Range Status   Specimen Description BLOOD RIGHT HAND  Final   Special Requests   Final    BOTTLES DRAWN AEROBIC AND ANAEROBIC Blood Culture adequate volume   Culture   Final    NO GROWTH 5 DAYS Performed at [facility], [address] [place], [place] [address]    Report Status 07/10/2018 FINAL  Final  Culture, blood (routine x 2)     Status: None   Collection Time: 07/05/18 10:15 AM  Result Value Ref Range Status   Specimen Description BLOOD LEFT HAND  Final   Special Requests   Final    BOTTLES DRAWN AEROBIC AND ANAEROBIC Blood Culture adequate volume   Culture   Final    NO GROWTH 5 DAYS Performed at [facility], [address] [place], [place] [address]    Report Status 07/10/2018 FINAL  Final  SARS Coronavirus 2 (CEPHEID - Performed in [facility] hospital lab), Hosp Order     Status: None   Collection Time: 07/05/18 10:55 AM  Result Value Ref Range Status   SARS Coronavirus 2 NEGATIVE NEGATIVE Final    Comment: (NOTE) If result is NEGATIVE SARS-CoV-2 target nucleic acids are NOT DETECTED. The SARS-CoV-2 RNA is generally detectable in upper and lower  respiratory specimens during the acute phase of infection. The lowest  concentration of SARS-CoV-2 viral copies this assay can detect is 250  copies / mL. A negative result does not preclude SARS-CoV-2 infection  and should not be used as the sole basis for treatment or other  patient management decisions.  A negative result may occur with  improper specimen collection / handling, submission of specimen other  than nasopharyngeal swab, presence of viral mutation(s) within the  areas targeted by this assay, and inadequate number of viral copies  (<250 copies / mL). A negative result must be combined with clinical  observations, patient history, and epidemiological information. If result is POSITIVE SARS-CoV-2 target nucleic acids are DETECTED. The SARS-CoV-2 RNA is generally detectable in upper and lower   respiratory specimens dur ing the acute phase of infection.  Positive  results are indicative of active infection with SARS-CoV-2.  Clinical  correlation with patient history and other diagnostic information is  necessary to determine patient infection status.  Positive results do  not rule out bacterial infection or co-infection with other viruses. If result is PRESUMPTIVE POSTIVE SARS-CoV-2 nucleic acids  MAY BE PRESENT.   A presumptive positive result was obtained on the submitted specimen  and confirmed on repeat testing.  While 2019 novel coronavirus  (SARS-CoV-2) nucleic acids may be present in the submitted sample  additional confirmatory testing may be necessary for epidemiological  and / or clinical management purposes  to differentiate between  SARS-CoV-2 and other Sarbecovirus currently known to infect humans.  If clinically indicated additional testing with an alternate test  methodology [phone]) is advised. The SARS-CoV-2 RNA is generally  detectable in upper and lower respiratory sp ecimens during the acute  phase of infection. The expected result is Negative. Fact Sheet for Patients:  BoilerBrush.com.cy Fact Sheet for Healthcare Providers: https://pope.com/ This test is not yet approved or cleared by the [place] FDA and has been authorized for detection and/or diagnosis of SARS-CoV-2 by FDA under an Emergency Use Authorization (EUA).  This EUA will remain in effect (meaning this test can be used) for the duration of the COVID-19 declaration under Section 564(b)(1) of the Act, 21 U.S.C. section 360bbb-3(b)(1), unless the authorization is terminated or revoked sooner. Performed at [facility], [address] [place], [place] [address]   MRSA PCR Screening     Status: None   Collection Time: 07/05/18  1:00 PM  Result Value Ref Range Status   MRSA by PCR NEGATIVE NEGATIVE Final    Comment:        The  GeneXpert MRSA Assay (FDA approved for NASAL specimens only), is one component of a comprehensive MRSA colonization surveillance program. It is not intended to diagnose MRSA infection nor to guide or monitor treatment for MRSA infections. Performed at [facility], [address] [place], [place] [address]       Studies: No results found.  Scheduled Meds: . bethanechol  10 mg Per Tube TID  . chlorhexidine  15 mL Mouth Rinse BID  . citalopram  30 mg Per Tube Daily  . enoxaparin (LOVENOX) injection  40 mg Subcutaneous Q24H  . [START ON 07/14/2018] feeding supplement (PRO-STAT SUGAR FREE 64)  30 mL Per Tube Daily  . gabapentin  300 mg Per Tube QPM  . mouth rinse  15 mL Mouth Rinse q12n4p  . propranolol  40 mg Per Tube Q2000    Continuous Infusions: . sodium chloride 250 mL (07/06/18 1041)  . feeding supplement (OSMOLITE 1.5 CAL)    . levETIRAcetam 500 mg (07/13/18 0848)     LOS: 30 days    Pleas Koch, MD [facility] 4:37 PM   If 7PM-7AM, please contact night-coverage [email or web address] Password [facility] 07/13/2018, 4:37 PM

## 2018-07-13 NOTE — Progress Notes (Addendum)
PULMONARY / CRITICAL CARE MEDICINE   NAME:  Jesse [name], MRN:  [id number], DOB:  [birth year], LOS: 30 ADMISSION DATE:  06/13/2018, CONSULTATION DATE:  06/13/2018 REFERRING MD:  Pilar PlateBero, (ED), CHIEF COMPLAINT:  Altered MS  BRIEF HISTORY:    28 yo male found in car unresponsive.  Intubated for airway protection.  Neuro imaging shows changes of diffuse anoxic injury.  UDS positive for cocaine, benzo's, amphetamines.  SIGNIFICANT PAST MEDICAL HISTORY   Substance abuse, Depression with prior suicide attempt  SIGNIFICANT EVENTS:  4/20 Admit with AMS, intubated 4/30 Trach 5/01 Add abx for fever, HCAP 5/4: Been off ventilator all weekend.  Seemingly more interactive. 5/20: Changed to 6 cuffless trach STUDIES:   MRI brain 4/20 >> multifocal diffusion abnormality within gray nuclei, b/l supratentorial white matter and b/l cerebellar hemispheres Echo 4/21 >> EF 55 to 60% EEG 4/22 >> background slowing  CULTURES:  Blood 4/20 > negative Trach aspirate 4/25 > negative HIV 4/20 > non reactive Covid 19 4/20 > Negative Urine 4/10 > No growth Sputum 4/30 > Few Staph aureus, Few Klebsiella  ANTIBIOTICS:  Unasyn 4/21 >> 4/27 Cefepime 5/02 >>   LINES/TUBES:  ETT 4/20 > 4/30 Trach 4/30 >   CONSULTANTS:  Neurology > s/o 4/27  SUBJECTIVE:  Fever curve improved.  White blood cell count coming down. OBJECTIVE:   CONSTITUTIONAL: Blood Pressure 115/76 (BP Location: Left Arm)   Pulse 80   Temperature 98.6 F (37 C) (Axillary)   Respiration 14   Height 6' (1.829 m)   Weight 77.8 kg   Oxygen Saturation 96%   Body Mass Index 23.26 kg/m   I/O last 3 completed shifts: In: -  Out: 1300 [Urine:1300]     FiO2 (%):  [21 %] 21 %  PHYSICAL EXAM: General: 28 year old white male currently resting in bed interacts with blinking.  Moving right upper extremity spontaneously. HEENT normocephalic atraumatic no jugular venous distention.  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  He seems to be making progress.  Appreciate input from speech pathology specifically in regards to his efforts to communicate.  He is actually doing better than I had anticipated, so I think he would be best served by downsizing to 6 cuffless as this will still be adequate for airway clearance but should also assist with Passy-Muir valve trials and further rehabilitation efforts  Plan We will downsize to size 6 cuffless I understand [place], following discharge from there he is welcome to follow-up at the trach clinic [place] in [place] if family desires He will have some blood-tinged tracheal secretions today this is to be expected and should subside over the course of the day  Simonne MartinetPeter E Babcock ACNP-BC [facility] Pulmonary/Critical Care Pager # [phone] OR # [phone] if no answer    Independently examined pt, evaluated data & formulated above care plan  with NP  He is making some progress, changed to 6 cuffless tracheostomy. Plan is to transfer to acute inpatient rehab in [facility]. Vassie Loll MD

## 2018-07-13 NOTE — Progress Notes (Signed)
Physical Therapy Treatment Patient Details Name: Jesse [name] MRN: [id number] DOB: 31-Oct-1990 Today's Date: 07/13/2018    History of Present Illness 28 year old male found down in car with suspected drug overdose versus status epilepticus. Intubated 4/20. MRI showing multiple areas of restricted diffusion, predominantly in the cerebellar hemispheres which might be representative of hypoxic/anoxic injury but specifically in bilateral globus pallidus and lentiform nuclei on the right. No significant medical past history but history of alcohol and drug abuse.     PT Comments    Patient seen for TBI therapies with OT and SLP team members. Patient much more alert and engaged during this session. Patient tolerated extended time at EOB and noted stable VS today. Patient with improved RUE movement noted. Current POC remains appropriate.    Follow Up Recommendations  CIR     Equipment Recommendations       Recommendations for Other Services       Precautions / Restrictions Precautions Precautions: Fall Precaution Comments: trach    Mobility  Bed Mobility Overal bed mobility: Needs Assistance Bed Mobility: Rolling;Supine to Sit;Sit to Supine     Supine to sit: Max assist;+2 for physical assistance Sit to supine: +2 for physical assistance;Total assist   General bed mobility comments: Helicopter technique to come to upright at EOB and assist to return to supine and reposition.   Transfers                 General transfer comment: EOB focus, then positioned in chair position in bed  Ambulation/Gait                 Stairs             Wheelchair Mobility    Modified Rankin (Stroke Patients Only)       Balance Overall balance assessment: Needs assistance Sitting-balance support: Feet supported Sitting balance-Leahy Scale: Poor Sitting balance - Comments: modest trunk and cervical engagement but requires hands on assist throughout session with  posterior support                                    Cognition Arousal/Alertness: Awake/alert Behavior During Therapy: Flat affect Overall Cognitive Status: Impaired/Different from baseline Area of Impairment: Attention;Following commands                   Current Attention Level: Focused   Following Commands: Follows one step commands inconsistently;Follows one step commands with increased time   Awareness: Intellectual Problem Solving: Slow processing;Decreased initiation;Requires tactile cues;Requires verbal cues General Comments: Pt turning eyes to therapist on command, visually tracking. Follow simple commands. Progressing with attempts for communication but remains limited.      Exercises General Exercises - Lower Extremity Ankle Circles/Pumps: PROM;Left;Right;Supine;10 reps Short Arc Quad: PROM;Both;Supine;10 reps Hip ABduction/ADduction: PROM;Right;Left;5 reps;Supine Hip Flexion/Marching: PROM;Right;Left;Supine;10 reps    General Comments        Pertinent Vitals/Pain Pain Assessment: Faces Faces Pain Scale: Hurts little more Pain Location: Pt repeatedly moves Rt hand over abdomen.  When asked if he had pain, he blinked for "yes" response, and "yes" for bellly - unsure of reliability.    Pain Descriptors / Indicators: Discomfort Pain Intervention(s): Monitored during session    Home Living                      Prior Function  PT Goals (current goals can now be found in the care plan section) Acute Rehab PT Goals Patient Stated Goal: unable to state Progress towards PT goals: Progressing toward goals(modest progression, improvements RUE)    Frequency    Min 3X/week      PT Plan Frequency needs to be updated    Co-evaluation PT/OT/SLP Co-Evaluation/Treatment: Yes Reason for Co-Treatment: Complexity of the patient's impairments (multi-system involvement) PT goals addressed during session: Mobility/safety with  mobility OT goals addressed during session: Strengthening/ROM SLP goals addressed during session: Communication    AM-PAC PT "6 Clicks" Mobility   Outcome Measure  Help needed turning from your back to your side while in a flat bed without using bedrails?: Total Help needed moving from lying on your back to sitting on the side of a flat bed without using bedrails?: Total Help needed moving to and from a bed to a chair (including a wheelchair)?: Total Help needed standing up from a chair using your arms (e.g., wheelchair or bedside chair)?: Total Help needed to walk in hospital room?: Total Help needed climbing 3-5 steps with a railing? : Total 6 Click Score: 6    End of Session Equipment Utilized During Treatment: Oxygen Activity Tolerance: Patient tolerated treatment well Patient left: in bed;with call bell/phone within reach;with SCD's reapplied(in chair position) Nurse Communication: Mobility status PT Visit Diagnosis: Other symptoms and signs involving the nervous system (O75.643)     Time: 3295-1884 PT Time Calculation (min) (ACUTE ONLY): 58 min  Charges:  $Therapeutic Activity: 8-22 mins                     Charlotte Crumb, PT DPT  Board Certified Neurologic Specialist Acute Rehabilitation Services Pager [phone] Office [phone]    Fabio Asa 07/13/2018, 1:22 PM

## 2018-07-14 ENCOUNTER — Encounter (HOSPITAL_COMMUNITY): Payer: Self-pay

## 2018-07-14 DIAGNOSIS — T50904A Poisoning by unspecified drugs, medicaments and biological substances, undetermined, initial encounter: Secondary | ICD-10-CM

## 2018-07-14 LAB — GLUCOSE, CAPILLARY
Glucose-Capillary: 100 mg/dL — ABNORMAL HIGH (ref 70–99)
Glucose-Capillary: 124 mg/dL — ABNORMAL HIGH (ref 70–99)
Glucose-Capillary: 128 mg/dL — ABNORMAL HIGH (ref 70–99)
Glucose-Capillary: 90 mg/dL (ref 70–99)

## 2018-07-14 LAB — CBC WITH DIFFERENTIAL/PLATELET
Abs Immature Granulocytes: 0.04 10*3/uL (ref 0.00–0.07)
Basophils Absolute: 0 10*3/uL (ref 0.0–0.1)
Basophils Relative: 0 %
Eosinophils Absolute: 0.2 10*3/uL (ref 0.0–0.5)
Eosinophils Relative: 2 %
HCT: 42.4 % (ref 39.0–52.0)
Hemoglobin: 14.3 g/dL (ref 13.0–17.0)
Immature Granulocytes: 0 %
Lymphocytes Relative: 19 %
Lymphs Abs: 2.1 10*3/uL (ref 0.7–4.0)
MCH: 30.6 pg (ref 26.0–34.0)
MCHC: 33.7 g/dL (ref 30.0–36.0)
MCV: 90.6 fL (ref 80.0–100.0)
Monocytes Absolute: 0.8 10*3/uL (ref 0.1–1.0)
Monocytes Relative: 7 %
Neutro Abs: 7.8 10*3/uL — ABNORMAL HIGH (ref 1.7–7.7)
Neutrophils Relative %: 72 %
Platelets: 235 10*3/uL (ref 150–400)
RBC: 4.68 MIL/uL (ref 4.22–5.81)
RDW: 12.8 % (ref 11.5–15.5)
WBC: 10.9 10*3/uL — ABNORMAL HIGH (ref 4.0–10.5)
nRBC: 0 % (ref 0.0–0.2)

## 2018-07-14 LAB — COMPREHENSIVE METABOLIC PANEL
ALT: 43 U/L (ref 0–44)
AST: 39 U/L (ref 15–41)
Albumin: 3.2 g/dL — ABNORMAL LOW (ref 3.5–5.0)
Alkaline Phosphatase: 69 U/L (ref 38–126)
Anion gap: 9 (ref 5–15)
BUN: 26 mg/dL — ABNORMAL HIGH (ref 6–20)
CO2: 24 mmol/L (ref 22–32)
Calcium: 9.4 mg/dL (ref 8.9–10.3)
Chloride: 112 mmol/L — ABNORMAL HIGH (ref 98–111)
Creatinine, Ser: 0.63 mg/dL (ref 0.61–1.24)
GFR calc Af Amer: >60 mL/min (ref >60)
GFR calc non Af Amer: >60 mL/min (ref >60)
Glucose, Bld: 138 mg/dL — ABNORMAL HIGH (ref 70–99)
Potassium: 3.4 mmol/L — ABNORMAL LOW (ref 3.5–5.1)
Sodium: 145 mmol/L (ref 135–145)
Total Bilirubin: 0.5 mg/dL (ref 0.3–1.2)
Total Protein: 6.8 g/dL (ref 6.5–8.1)

## 2018-07-14 MED ORDER — DIAZEPAM 5 MG PO TABS: 2.5000 mg | ORAL_TABLET | Freq: Every evening | ORAL | 0 refills | Status: AC | PRN

## 2018-07-14 MED ORDER — GABAPENTIN 300 MG/6ML PO SOLN: 300.0000 mg | Freq: Every evening | ORAL | 0 refills | Status: AC

## 2018-07-14 MED ORDER — PROPRANOLOL HCL 20 MG/5ML PO SOLN: 40.0000 mg | Freq: Every day | ORAL | 12 refills | Status: AC

## 2018-07-14 MED ORDER — ACETAMINOPHEN 160 MG/5ML PO SOLN: 650.0000 mg | Freq: Four times a day (QID) | ORAL | 0 refills | Status: AC | PRN

## 2018-07-14 MED ORDER — CITALOPRAM HYDROBROMIDE 10 MG PO TABS: 30.0000 mg | ORAL_TABLET | Freq: Every day | ORAL | 0 refills | Status: AC

## 2018-07-14 MED ORDER — LEVETIRACETAM 100 MG/ML PO SOLN: 500.0000 mg | Freq: Two times a day (BID) | ORAL | 0 refills | Status: AC

## 2018-07-14 MED ORDER — OSMOLITE 1.5 CAL PO LIQD: 1000.0000 mL | ORAL | 0 refills | Status: AC

## 2018-07-14 MED ORDER — IBUPROFEN 200 MG PO TABS: 200.0000 mg | ORAL_TABLET | Freq: Four times a day (QID) | ORAL | 0 refills | Status: AC | PRN

## 2018-07-14 MED ORDER — BETHANECHOL CHLORIDE 10 MG PO TABS: 10.0000 mg | ORAL_TABLET | Freq: Three times a day (TID) | ORAL | 0 refills | Status: AC

## 2018-07-14 NOTE — Discharge Summary (Addendum)
Physician Discharge Summary  Jesse [name] ZOX:[id number] DOB: 20-Aug-1990 DOA: 06/13/2018  PCP: No primary care provider on file.  Admit date: 06/13/2018 Discharge date: 07/14/2018  Time spent: 50 minutes  Recommendations for Outpatient Follow-up:  1. Patient is being transferred to [facility] for further holistic management in terms of therapy 2. Strongly suggest follow-up within 2 to 3 days at [facility] with a psychiatrist 3. Will need continued Passy-Muir trials if patient is willing 4. Patient's splint needs to be on his left arm 4 hours at a time with a break in between 5. Would suggest SCDs/Lovenox given he is immobile 6. Place sacral foam to the sacral area q 3 days/ prn if stool 7. PEG feeds to continue at 65 cc an hour 8. Please note [facility] as per below 9. Recommend Chem-12 TSH CBC with differential in about 1 week  Discharge Diagnoses:  Active Problems:   Altered mental status   Acute respiratory failure with hypoxia (HCC)   Status post tracheostomy (HCC)   Drug overdose   Shock (HCC)   Goals of care, counseling/discussion   Palliative care by specialist   AKI (acute kidney injury) (HCC)   Tachypnea   Acute blood loss anemia   Drug addiction (HCC)   Anoxic brain injury (HCC)   Palliative care encounter   Pressure injury of skin   Tracheostomy dependence (HCC)   Discharge Condition: Fair, does exhibit good rehab potential  Diet recommendation: Tube feeds at this time  Filed Weights   07/12/18 0446 07/13/18 0426 07/14/18 0500  Weight: 77.7 kg 77.8 kg 73.2 kg    History of present illness:  28 year old male who was found down in his vehicle, unresponsive, suspected postictal. He had several unmarked pills around him. Patient did not respond to naloxone.  required intubation for airway protection. Upon admission to the intensive care unit he was hypotensive, required vasopressors.  prolonged hospital stay, ?anoxic brain injury.     staph aureus and Klebsiella pneumonia tracheostomy April 30, in orderto be liberated from mechanical ventilation.   Course complicated by encephalopathy and fever of unknown etiology for which infectious disease was consulted.  Fever thought to be central.   Accepted at [facility] acute unit PEG tube placed by interventional radiology on 07/11/2018.  Tracheotomy was changed from #8 cuffed trach to new #6 cuffless trach via stoma on 5/20 per critical care  Hospital Course:    Acute hypoxic and hypercapnic respiratory failure due to encephalopathy, possible anoxic brain injury.  Tolerating trach collar well-cuffed trach changed out on 5/20 as above not requiring high flow oxygen  Continue trach suctioning as needed Continue Keppra for seizure prophylaxis-meds all converted to liquid meds Accepted to [facility] acute [facility] and is stabilized for discharge as of 5/21 without any other metabolic abnormalities  Resolving sepsis secondary to Klebsiella ESBL HCAP  Leukocytosis, fever, tachycardia, tachypnea have resolved  Completed 12 days of meropenem  Blood cultures x2- to date  Dysphagia possibly 2/2 to presumed anoxic brain injury PEG tube placed 07/11/2018 by interventional radiology Dietitian consulted to initiate PEG tube and is now at goal of 65 Keep head of bed elevated greater than 35 degree to prevent aspiration  Resolved fever of unknown etiology with suspicion for central fever Off antibiotics since 07/08/2018 per infectious disease Remains afebrile  Elevated AST ALT Mild elevation of AST and ALT are trending down Alkaline phosphatase and bilirubin are normal Acute hepatitis panel negative No acute findings on CT  abdomen and pelvis without contrast done on 07/08/2018  Mildly elevated lipase level Lipase on 07/11/18 was 71 from 65 on 07/09/2018 Asymptomatic with no tenderness on palpation on exam No acute abnormalities on CT abdomen and pelvis without contrast done on  07/08/2018  Resolved urinary retention. Condom cath in place with good urine output Currently on bethanechol 10 mg 3 times daily Net I&O - 5.0 since admission  Polysubstance abuse including cocaine UDS done on 06/13/2018+ for cocaine and benzodiazepine  Severe depression: Continue antidepressant Celexa, gabapentin, and Valium as needed  ADHD: Continue to hold off Vyvanse due to concern for lowering seizure threshold    Discharge Exam: Vitals:   07/14/18 0332 07/14/18 0400  BP:    Pulse: 73   Resp: 14   Temp:  98.8 F (37.1 C)  SpO2: 93%     General: Opens eyes, does not seemingly respond to questions although is able to understand based on my interactions with him Cardiovascular: S1-S2 no murmur rub or gallop-telemetry sinus rhythm heart rate 90s SPO2 97 Respiratory: Clinically clear no added sound Abdomen soft no rebound-PEG tube noted in middle quadrant Leg soft nontender SCDs were on but I examined below He has a chest scar in the center of the chest His trach stoma seems clean No icterus no pallor no JVD Neurologically is able to grip my fingers on the right side  Discharge Instructions   Discharge Instructions    Diet - low sodium heart healthy   Complete by:  As directed    Increase activity slowly   Complete by:  As directed      Allergies as of 07/14/2018      Reactions   Other Other (See Comments)   Seasonal allergies- Stuffy nose, itchy eyes, congestion, etc..      Medication List    STOP taking these medications   acetaminophen 325 MG tablet Commonly known as:  TYLENOL Replaced by:  acetaminophen 160 MG/5ML solution   Advil PM 200-38 MG Tabs Generic drug:  Ibuprofen-diphenhydrAMINE Cit   aspirin EC 325 MG tablet   cetirizine 10 MG tablet Commonly known as:  ZYRTEC   fluticasone 50 MCG/ACT nasal spray Commonly known as:  FLONASE   gabapentin 300 MG capsule Commonly known as:  NEURONTIN Replaced by:  gabapentin 300 MG/6ML solution    lisdexamfetamine 30 MG capsule Commonly known as:  VYVANSE   LORazepam 0.5 MG tablet Commonly known as:  ATIVAN   Melatonin 10 MG Tabs   naproxen sodium 220 MG tablet Commonly known as:  ALEVE     TAKE these medications   acetaminophen 160 MG/5ML solution Commonly known as:  TYLENOL Place 20.3 mLs (650 mg total) into feeding tube every 6 (six) hours as needed for fever. Replaces:  acetaminophen 325 MG tablet   bethanechol 10 MG tablet Commonly known as:  URECHOLINE Place 1 tablet (10 mg total) into feeding tube 3 (three) times daily.   citalopram 10 MG tablet Commonly known as:  CELEXA Place 3 tablets (30 mg total) into feeding tube daily. What changed:  how to take this   diazepam 5 MG tablet Commonly known as:  VALIUM Place 0.5 tablets (2.5 mg total) into feeding tube at bedtime as needed (for sleep or anxiety). What changed:    medication strength  how much to take  how to take this   feeding supplement (OSMOLITE 1.5 CAL) Liqd Place 1,000 mLs into feeding tube continuous.   gabapentin 300 MG/6ML solution Commonly known as:  NEURONTIN Place 6 mLs (300 mg total) into feeding tube every evening. Replaces:  gabapentin 300 MG capsule   ibuprofen 200 MG tablet Commonly known as:  ADVIL Place 1 tablet (200 mg total) into feeding tube every 6 (six) hours as needed for fever. What changed:    how much to take  how to take this  reasons to take this   levETIRAcetam 100 MG/ML solution Commonly known as:  KEPPRA Take 5 mLs (500 mg total) by mouth 2 (two) times daily.   propranolol 20 MG/5ML solution Commonly known as:  INDERAL Place 10 mLs (40 mg total) into feeding tube daily at 8 pm.      Allergies  Allergen Reactions  . Other Other (See Comments)    Seasonal allergies- Stuffy nose, itchy eyes, congestion, etc..      The results of significant diagnostics from this hospitalization (including imaging, microbiology, ancillary and laboratory) are  listed below for reference.    Significant Diagnostic Studies: Ct Abdomen Wo Contrast  Result Date: 07/08/2018 CLINICAL DATA:  28 year old male referred for gastrostomy tube placement EXAM: CT ABDOMEN WITHOUT CONTRAST TECHNIQUE: Multidetector CT imaging of the abdomen was performed following the standard protocol without IV contrast. COMPARISON:  None. FINDINGS: Lower chest: Likely atelectatic changes at the right lung base. Hepatobiliary: Unremarkable liver.  Unremarkable gallbladder Pancreas: Unremarkable Spleen: Unremarkable Adrenals/Urinary Tract: Unremarkable appearance of the adrenal glands. No evidence of hydronephrosis of the right or left kidney. No nephrolithiasis. Unremarkable course of the bilateral ureters. Unremarkable appearance of the urinary bladder. Stomach/Bowel: Enteric tube traverses the lower mediastinum, and terminates in the stomach near the pylorus. Colon overlies the inferior margin of stomach with no evidence of prior abdominal surgery of the midline abdomen. Fluid filled right colon. Unremarkable visualized small bowel. No abnormally distended small bowel or visualized colon. Vascular/Lymphatic: Unremarkable vasculature.  No adenopathy Other: No abdominal hernia. No surgical changes of the midline abdomen Musculoskeletal: No acute displaced fracture IMPRESSION: No acute finding of the abdomen. Enteric tube terminates near the pylorus. Electronically Signed   By: Gilmer Mor D.O.   On: 07/08/2018 13:20   Ir Gastrostomy Tube Mod Sed  Result Date: 07/11/2018 INDICATION: 29 year old with dysphagia, respiratory failure and encephalopathy. Patient needs a percutaneous gastrostomy tube for nutrition. EXAM: PERCUTANEOUS GASTROSTOMY TUBE WITH FLUOROSCOPIC GUIDANCE Physician: Rachelle Hora. Lowella Dandy, MD MEDICATIONS: Ancef 2 g; Antibiotics were administered within 1 hour of the procedure. ANESTHESIA/SEDATION: Versed 2.0 mg IV; Fentanyl 100 mcg IV Moderate Sedation Time:  29 minutes The patient was  continuously monitored during the procedure by the interventional radiology nurse under my direct supervision. FLUOROSCOPY TIME:  Fluoroscopy Time: 4 minutes, 48 seconds, 9 mGy COMPLICATIONS: None immediate. PROCEDURE: Informed consent was obtained for a percutaneous gastrostomy tube. The patient was placed on the interventional table. Fluoroscopy demonstrated oral contrast in the transverse colon. An orogastric tube was placed with fluoroscopic guidance. The anterior abdomen was prepped and draped in sterile fashion. Maximal barrier sterile technique was utilized including caps, mask, sterile gowns, sterile gloves, sterile drape, hand hygiene and skin antiseptic. Stomach was inflated with air through the orogastric tube. The skin and subcutaneous tissues were anesthetized with 1% lidocaine. A 17 gauge needle was directed into the distended stomach with fluoroscopic guidance. A wire was advanced into the stomach and a T-tact was deployed. A 9-French vascular sheath was placed and the orogastric tube was snared using a Gooseneck snare device. The orogastric tube and snare were pulled out of the patient's mouth. The snare  device was connected to a 20-French gastrostomy tube. The snare device and gastrostomy tube were pulled through the patient's mouth and out the anterior abdominal wall. The gastrostomy tube was cut to an appropriate length. Contrast injection through gastrostomy tube confirmed placement within the stomach. Fluoroscopic images were obtained for documentation. The gastrostomy tube was flushed with normal saline. IMPRESSION: Successful fluoroscopic guided percutaneous gastrostomy tube placement. Electronically Signed   By: Richarda Overlie M.D.   On: 07/11/2018 16:57   Dg Chest Port 1 View  Result Date: 07/07/2018 CLINICAL DATA:  28 year old male with tracheostomy and hypoxia. EXAM: PORTABLE CHEST 1 VIEW COMPARISON:  07/04/2018 and earlier. FINDINGS: Portable AP semi upright view at 0606 hours.  Tracheostomy tube with no adverse features. Stable visible enteric tube. Normal cardiac size and mediastinal contours. Platelike atelectasis at the right lung base is stable. No pneumothorax, pulmonary edema, pleural effusion or other confluent opacity. Negative visible bowel gas pattern. IMPRESSION: 1. Tracheostomy tube with no adverse features. 2. Stable atelectasis at the right lung base. Electronically Signed   By: Odessa Fleming M.D.   On: 07/07/2018 08:48   Dg Chest Port 1 View  Result Date: 07/04/2018 CLINICAL DATA:  Difficulty breathing the EXAM: PORTABLE CHEST 1 VIEW COMPARISON:  Jun 25, 2018 FINDINGS: Tracheostomy catheter tip is 3.8 cm above the carina. Feeding tube tip is in the distal stomach. No pneumothorax. There is a focal area of airspace consolidation in the medial right base inferiorly. Lungs elsewhere are clear. Heart size and pulmonary vascularity are normal. No adenopathy. No bone lesions. IMPRESSION: Tube and catheter positions as described without pneumothorax. Small area of apparent pneumonia in the inferior aspect medial right base. Lungs elsewhere are clear. Heart size normal. No evident adenopathy. Electronically Signed   By: Bretta Bang III M.D.   On: 07/04/2018 14:28   Dg Chest Port 1 View  Result Date: 06/25/2018 CLINICAL DATA:  Respiratory failure EXAM: PORTABLE CHEST 1 VIEW COMPARISON:  Yesterday FINDINGS: Tracheostomy tube in place. There is a feeding tube which at least reaches the stomach. Streaky densities at the bases. No edema, effusion, or pneumothorax. IMPRESSION: Stable atelectasis at the bases. Electronically Signed   By: Marnee Spring M.D.   On: 06/25/2018 07:34   Dg Chest Port 1 View  Result Date: 06/24/2018 CLINICAL DATA:  Atelectasis of right lung EXAM: PORTABLE CHEST - 1 VIEW COMPARISON:  the previous day's study FINDINGS: Lung apices are partially excluded. Tracheal tube partially visualized. Significant improvement in the right lower lobe atelectasis seen  previously with minimal infrahilar residual. Left lung remains clear. Heart size and mediastinal contours are within normal limits. No effusion. Visualized bones unremarkable. IMPRESSION: Improving right lower lung atelectasis Electronically Signed   By: Corlis Leak M.D.   On: 06/24/2018 07:53   Dg Chest Port 1 View  Result Date: 06/23/2018 CLINICAL DATA:  28 year old male with a history of tracheostomy EXAM: PORTABLE CHEST 1 VIEW COMPARISON:  06/22/2018 FINDINGS: Cardiomediastinal silhouette unchanged in size and contour. New opacity at the right lung base obscuring the right hemidiaphragm. Interval placement of tracheostomy tube terminating suitably above the carina. Interval removal of the gastric tube. Left lung relatively well aerated. IMPRESSION: New opacity at the right lung base, compatible with right lower lobe consolidation and/or atelectasis, aspiration not excluded. Interval placement of tracheostomy tube Electronically Signed   By: Gilmer Mor D.O.   On: 06/23/2018 15:21   Dg Chest Port 1 View  Result Date: 06/22/2018 CLINICAL DATA:  Endotracheal tube.  EXAM: PORTABLE CHEST 1 VIEW COMPARISON:  One-view chest x-ray 06/19/2018 FINDINGS: Endotracheal tube terminates at the level clavicles, well above the carina. NG tube courses off the inferior border the film. The heart size is normal. Right lower lobe pneumonia continues to improve. No new airspace disease is present. IMPRESSION: 1. Continued improvement of right lower lobe pneumonia. 2. Support apparatus is stable. Electronically Signed   By: Marin Roberts M.D.   On: 06/22/2018 07:40   Dg Chest Port 1 View  Result Date: 06/19/2018 CLINICAL DATA:  Respiratory failure. EXAM: PORTABLE CHEST 1 VIEW COMPARISON:  06/18/2018. FINDINGS: Normal heart size. ET tube 5.9 cm above carina. RIGHT basilar infiltrate is improved. LEFT lung remains clear. Nasogastric tube is in the stomach. IMPRESSION: Improved aspiration pneumonia. Electronically  Signed   By: Elsie Stain M.D.   On: 06/19/2018 07:42   Dg Chest Port 1 View  Result Date: 06/18/2018 CLINICAL DATA:  Found down. Drug overdose. EXAM: PORTABLE CHEST 1 VIEW COMPARISON:  06/15/2018. FINDINGS: Unchanged and satisfactory position ET tube and nasogastric tube. RIGHT lower lobe pneumonia and RIGHT basilar atelectasis appear slightly improved, when technique differences are considered. No similar findings on the LEFT. Negative osseous structures. IMPRESSION: Slight improvement aeration. Electronically Signed   By: Elsie Stain M.D.   On: 06/18/2018 07:29   Dg Chest Port 1 View  Result Date: 06/15/2018 CLINICAL DATA:  Follow-up endotracheal tube and right basilar infiltrate EXAM: PORTABLE CHEST 1 VIEW COMPARISON:  06/14/2018 FINDINGS: Cardiac shadow is stable. Endotracheal tube and gastric catheter are again seen and stable. Persistent right basilar infiltrate is noted slightly worsened when compared with the prior exam. The left lung remains clear. IMPRESSION: Slight worsening of right basilar infiltrate. Electronically Signed   By: Alcide Clever M.D.   On: 06/15/2018 08:13   Dg Abd Portable 1v  Result Date: 07/11/2018 CLINICAL DATA:  For possible gastrostomy tube placement EXAM: PORTABLE ABDOMEN - 1 VIEW COMPARISON:  None. FINDINGS: Contrast material noted throughout the colon which is separate and inferior from the stomach. Feeding tube tip is in the distal stomach or proximal duodenum. No evidence of bowel obstruction, organomegaly or free air. IMPRESSION: No acute findings. Electronically Signed   By: Charlett Nose M.D.   On: 07/11/2018 08:38    Microbiology: Recent Results (from the past 240 hour(s))  Culture, Urine     Status: None   Collection Time: 07/05/18  9:41 AM  Result Value Ref Range Status   Specimen Description URINE, RANDOM  Final   Special Requests Normal  Final   Culture   Final    NO GROWTH Performed at [facility], [address], [place], [place]  [address]    Report Status 07/06/2018 FINAL  Final  Expectorated sputum assessment w rflx to resp cult     Status: None   Collection Time: 07/05/18  9:41 AM  Result Value Ref Range Status   Specimen Description EXPECTORATED SPUTUM  Final   Special Requests Normal  Final   Sputum evaluation   Final    THIS SPECIMEN IS ACCEPTABLE FOR SPUTUM CULTURE Performed at [facility], [address] [place], [place] [address]    Report Status 07/05/2018 FINAL  Final  Culture, respiratory     Status: None   Collection Time: 07/05/18  9:41 AM  Result Value Ref Range Status   Specimen Description EXPECTORATED SPUTUM  Final   Special Requests Normal Reflexed from [id number]  Final   Gram Stain  Final    MODERATE WBC PRESENT, PREDOMINANTLY PMN RARE SQUAMOUS EPITHELIAL CELLS PRESENT NO ORGANISMS SEEN Performed at [facility], [address] [place], [place] [address]    Culture FEW CANDIDA ALBICANS  Final   Report Status 07/07/2018 FINAL  Final  Culture, blood (routine x 2)     Status: None   Collection Time: 07/05/18 10:00 AM  Result Value Ref Range Status   Specimen Description BLOOD RIGHT HAND  Final   Special Requests   Final    BOTTLES DRAWN AEROBIC AND ANAEROBIC Blood Culture adequate volume   Culture   Final    NO GROWTH 5 DAYS Performed at [facility], [address], [place], [place] [address]    Report Status 07/10/2018 FINAL  Final  Culture, blood (routine x 2)     Status: None   Collection Time: 07/05/18 10:15 AM  Result Value Ref Range Status   Specimen Description BLOOD LEFT HAND  Final   Special Requests   Final    BOTTLES DRAWN AEROBIC AND ANAEROBIC Blood Culture adequate volume   Culture   Final    NO GROWTH 5 DAYS Performed at [facility], [address], [place], [place] [address]    Report Status 07/10/2018 FINAL  Final  SARS Coronavirus 2 (CEPHEID - Performed in [facility] hospital lab), Hosp Order     Status: None   Collection Time: 07/05/18  10:55 AM  Result Value Ref Range Status   SARS Coronavirus 2 NEGATIVE NEGATIVE Final    Comment: (NOTE) If result is NEGATIVE SARS-CoV-2 target nucleic acids are NOT DETECTED. The SARS-CoV-2 RNA is generally detectable in upper and lower  respiratory specimens during the acute phase of infection. The lowest  concentration of SARS-CoV-2 viral copies this assay can detect is 250  copies / mL. A negative result does not preclude SARS-CoV-2 infection  and should not be used as the sole basis for treatment or other  patient management decisions.  A negative result may occur with  improper specimen collection / handling, submission of specimen other  than nasopharyngeal swab, presence of viral mutation(s) within the  areas targeted by this assay, and inadequate number of viral copies  (<250 copies / mL). A negative result must be combined with clinical  observations, patient history, and epidemiological information. If result is POSITIVE SARS-CoV-2 target nucleic acids are DETECTED. The SARS-CoV-2 RNA is generally detectable in upper and lower  respiratory specimens dur ing the acute phase of infection.  Positive  results are indicative of active infection with SARS-CoV-2.  Clinical  correlation with patient history and other diagnostic information is  necessary to determine patient infection status.  Positive results do  not rule out bacterial infection or co-infection with other viruses. If result is PRESUMPTIVE POSTIVE SARS-CoV-2 nucleic acids MAY BE PRESENT.   A presumptive positive result was obtained on the submitted specimen  and confirmed on repeat testing.  While 2019 novel coronavirus  (SARS-CoV-2) nucleic acids may be present in the submitted sample  additional confirmatory testing may be necessary for epidemiological  and / or clinical management purposes  to differentiate between  SARS-CoV-2 and other Sarbecovirus currently known to infect humans.  If clinically indicated  additional testing with an alternate test  methodology [phone]) is advised. The SARS-CoV-2 RNA is generally  detectable in upper and lower respiratory sp ecimens during the acute  phase of infection. The expected result is Negative. Fact Sheet for Patients:  BoilerBrush.com.cyhttps://www.fda.gov/media/136312/download Fact Sheet for Healthcare Providers: https://pope.com/https://www.fda.gov/media/136313/download This test is not yet approved or cleared by the [place] FDA and has been authorized for detection and/or diagnosis of SARS-CoV-2 by FDA under an Emergency Use Authorization (EUA).  This EUA will remain in effect (meaning this test can be used) for the duration of the COVID-19 declaration under Section 564(b)(1) of the Act, 21 U.S.C. section 360bbb-3(b)(1), unless the authorization is terminated or revoked sooner. Performed at [facility], [address] [place], [place] [address]   MRSA PCR Screening     Status: None   Collection Time: 07/05/18  1:00 PM  Result Value Ref Range Status   MRSA by PCR NEGATIVE NEGATIVE Final    Comment:        The GeneXpert MRSA Assay (FDA approved for NASAL specimens only), is one component of a comprehensive MRSA colonization surveillance program. It is not intended to diagnose MRSA infection nor to guide or monitor treatment for MRSA infections. Performed at [facility], [address] [place], [place] [address]      Labs: Basic Metabolic Panel: Recent Labs  Lab 07/08/18 0736 07/09/18 0532 07/11/18 0717 07/13/18 1652 07/14/18 0316  NA 145 144 143 146* 145  K 3.8 3.7 4.5 3.6 3.4*  CL 108 107 108 110 112*  CO2 30 27 23 24 24   GLUCOSE 125* 117* 94 115* 138*  BUN 31* 28* 32* 28* 26*  CREATININE 0.71 0.65 0.78 0.72 0.63  CALCIUM 9.1 9.3 10.0 9.5 9.4   Liver Function Tests: Recent Labs  Lab 07/08/18 0736 07/09/18 0532 07/11/18 0717 07/14/18 0316  AST 55* 57* 52* 39  ALT 74* 75* 67* 43  ALKPHOS 68 70 69 69  BILITOT 0.7 0.7 1.2 0.5   PROT 7.0 6.7 7.8 6.8  ALBUMIN 3.0* 3.0* 3.5 3.2*   Recent Labs  Lab 07/09/18 0532 07/11/18 0717  LIPASE 65* 71*  AMYLASE 67  --    No results for input(s): AMMONIA in the last 168 hours. CBC: Recent Labs  Lab 07/08/18 0736 07/11/18 0717 07/13/18 1652 07/14/18 0316  WBC 8.7 9.9 9.7 10.9*  NEUTROABS 6.0 6.4 6.2 7.8*  HGB 13.4 15.9 14.3 14.3  HCT 41.6 46.8 42.1 42.4  MCV 93.7 90.2 90.0 90.6  PLT 235 247 244 235   Cardiac Enzymes: No results for input(s): CKTOTAL, CKMB, CKMBINDEX, TROPONINI in the last 168 hours. BNP: BNP (last 3 results) No results for input(s): BNP in the last 8760 hours.  ProBNP (last 3 results) No results for input(s): PROBNP in the last 8760 hours.  CBG: Recent Labs  Lab 07/13/18 1200 07/13/18 1610 07/13/18 1952 07/13/18 2357 07/14/18 0410  GLUCAP 100* 102* 107* 100* 124*       Signed:  Rhetta MuraJai-Gurmukh Neya Creegan MD   [facility] 07/14/2018, 7:27 AM

## 2018-07-14 NOTE — Progress Notes (Signed)
[facility] arrived. Given oral and written discharge instructions, respiratory/trach supplies, and pt's hockey puck with was only personal item in hospital.

## 2018-07-14 NOTE — Progress Notes (Signed)
Report given to nurse at [facility].

## 2018-07-14 NOTE — Plan of Care (Signed)
  Problem: Respiratory: Goal: Ability to maintain a clear airway and adequate ventilation will improve Outcome: Progressing   

## 2018-07-14 NOTE — TOC Transition Note (Signed)
Transition of Care [facility]) - CM/SW Discharge Note   Patient Details  Name: Jesse [name] MRN: [id number] Date of Birth: 1990/05/08  [facility]) CM/SW Contact:  Glennon Mac, RN Phone Number: 07/14/2018, 4:22 PM   Clinical Narrative:  Pt medically stable for discharge to [facility] today. Packet prepared and at front desk; EMTALA completed by MD.  [facility] to transport, with arrival time of 1:00pm.  Bedside nurse to notify mother when transport agency leaves with pt.       Final next level of care: IP Rehab Facility Barriers to Discharge: No Barriers Identified   Patient Goals and CMS Choice Patient states their goals for this hospitalization and ongoing recovery are:: To get to rehab CMS Medicare.gov Compare Post Acute Care list provided to:: Patient Represenative (must comment)(Mother) Choice offered to / list presented to : Parent                        Discharge Plan and Services   Discharge Planning Services: CM Consult Post Acute Care Choice: IP Rehab                                   Readmission Risk Interventions No flowsheet data found.  Quintella Baton, RN, BSN  Trauma/Neuro ICU Case Manager [phone]

## 2019-08-31 IMAGING — CT CT ABDOMEN WITHOUT CONTRAST
2 of 4 series · 17 of 46 positions shown, 19 images · non-contrast
Comparison: None.

CLINICAL DATA: 27-year-old male referred for gastrostomy tube
placement

EXAM:
CT ABDOMEN WITHOUT CONTRAST
TECHNIQUE: Multidetector CT imaging of the abdomen was performed following the
standard protocol without IV contrast.

[Series 3: ap without · axial · non-contrast · 0.71mm/px · z∈[+1063,+1273]mm · 14 of 50 slices shown, 16 images]
[im 4/50  soft-tissue]
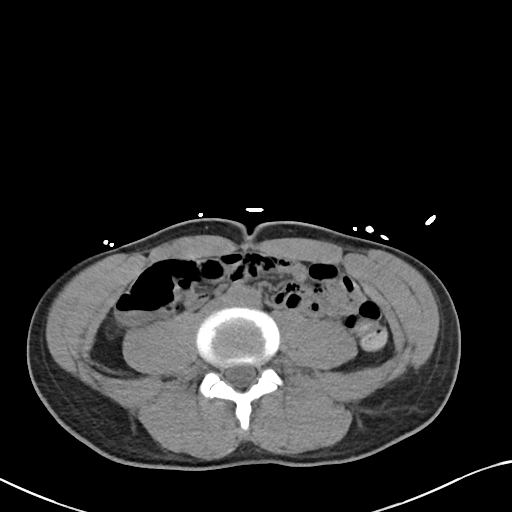
[im 4/50  bone]
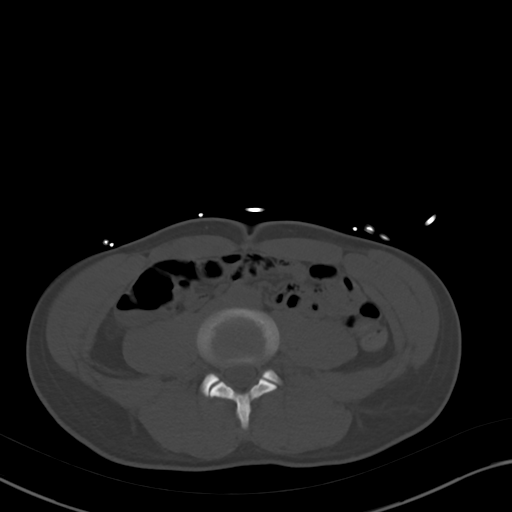
[im 7/50  soft-tissue]
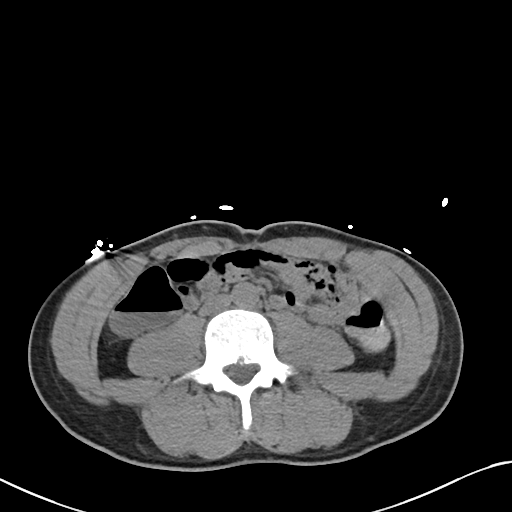
[im 10/50  soft-tissue]
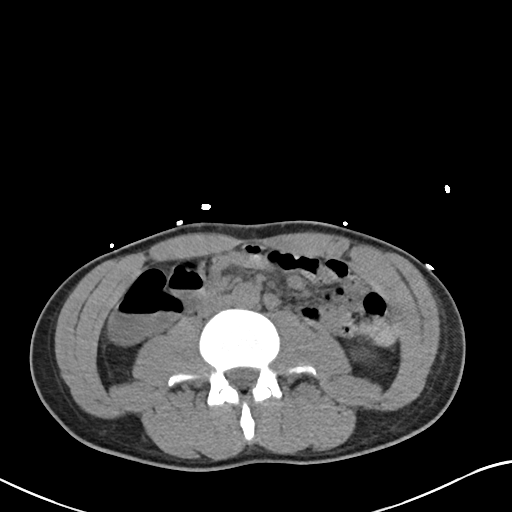
[im 13/50  soft-tissue]
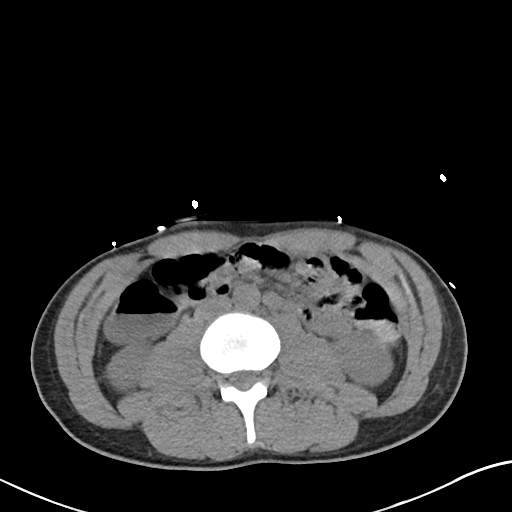
[im 16/50  soft-tissue]
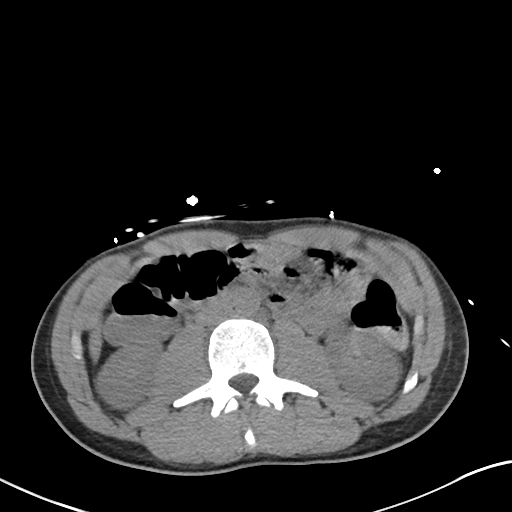
[im 19/50  soft-tissue]
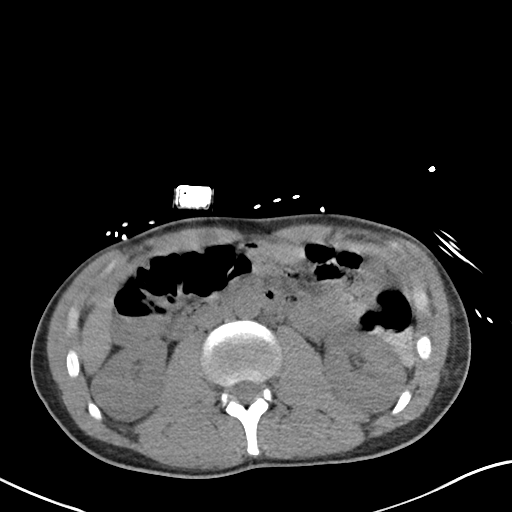
[im 22/50  soft-tissue]
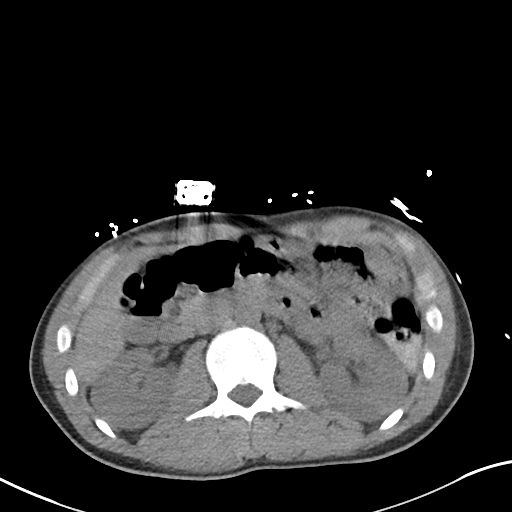
[im 28/50  soft-tissue]
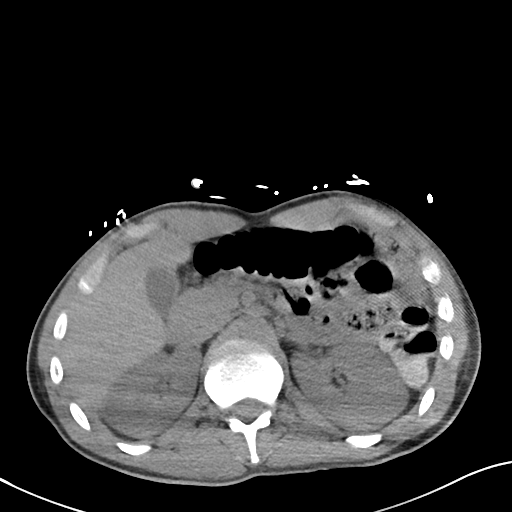
[im 31/50  soft-tissue]
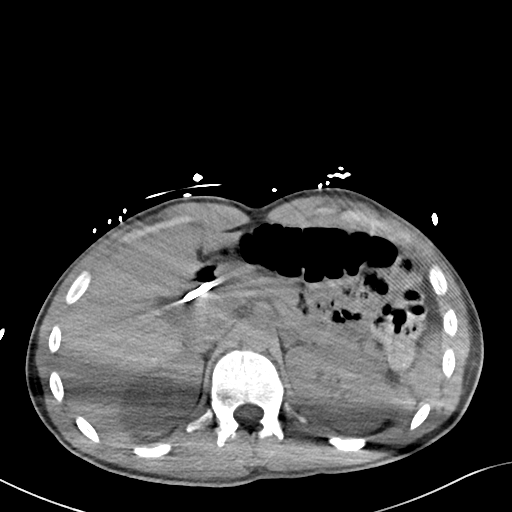
[im 31/50  bone]
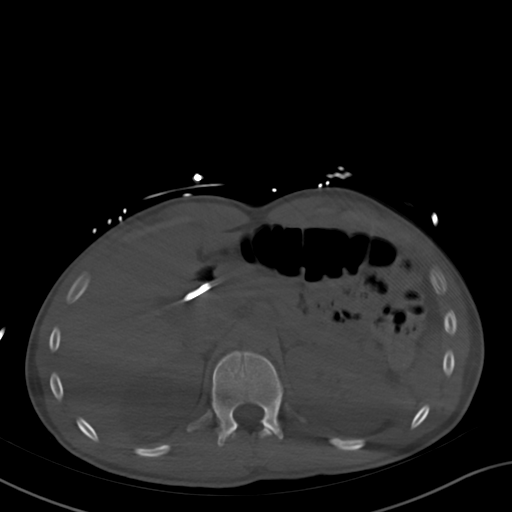
[im 34/50  soft-tissue]
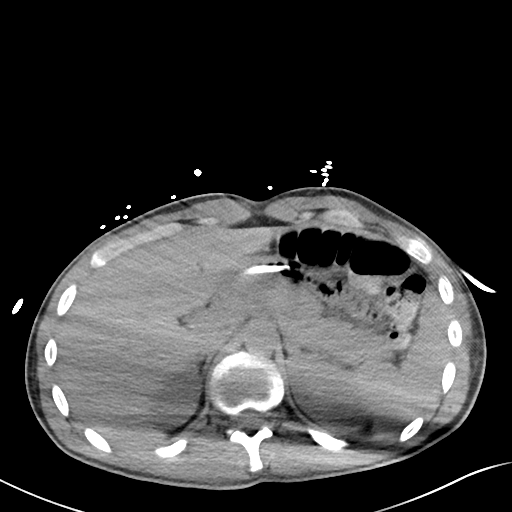
[im 37/50  soft-tissue]
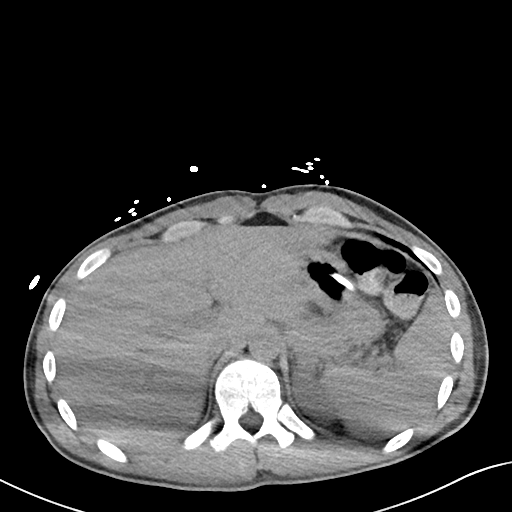
[im 40/50  soft-tissue]
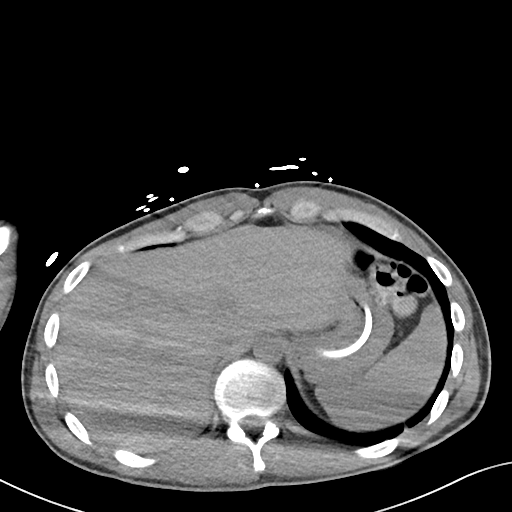
[im 43/50  soft-tissue]
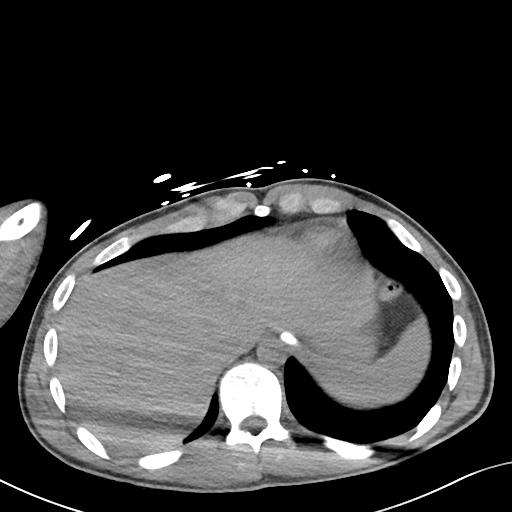
[im 46/50  soft-tissue]
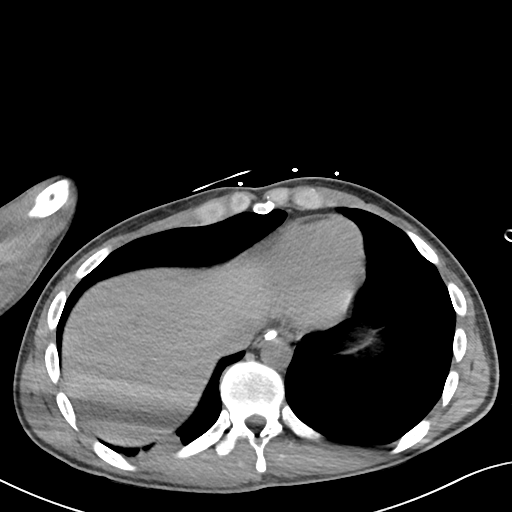

[Series 6: cor · coronal · 0.59mm/px · 3 of 99 slices shown]
[im 33/99  soft-tissue]
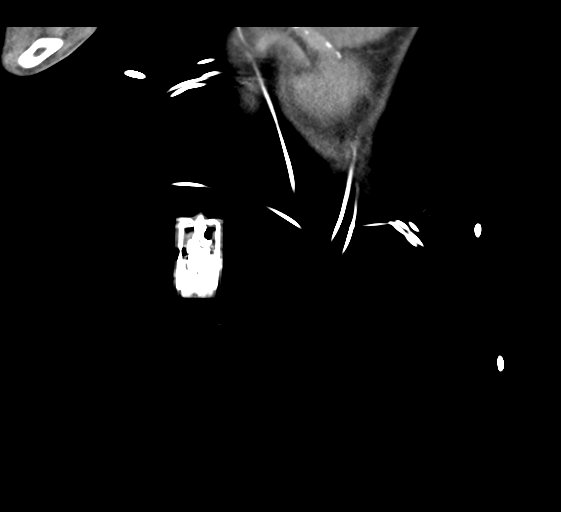
[im 44/99  soft-tissue]
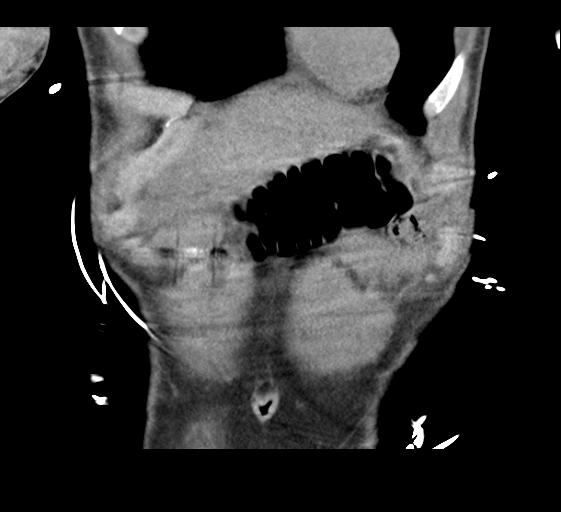
[im 55/99  soft-tissue]
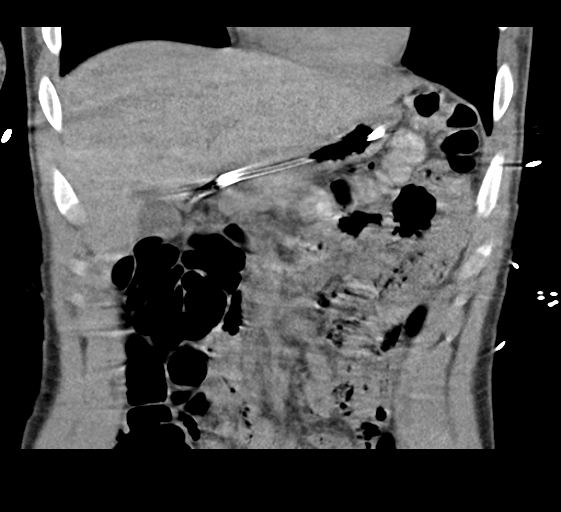

[17 of 46 positions shown; findings below may reference images not displayed]

FINDINGS: Lower chest: Likely atelectatic changes at the right lung base.

Hepatobiliary: Unremarkable liver.  Unremarkable gallbladder

Pancreas: Unremarkable

Spleen: Unremarkable

Adrenals/Urinary Tract: Unremarkable appearance of the adrenal
glands. No evidence of hydronephrosis of the right or left kidney.
No nephrolithiasis. Unremarkable course of the bilateral ureters.
Unremarkable appearance of the urinary bladder.

Stomach/Bowel: Enteric tube traverses the lower mediastinum, and
terminates in the stomach near the pylorus.

Colon overlies the inferior margin of stomach with no evidence of
prior abdominal surgery of the midline abdomen. Fluid filled right
colon. Unremarkable visualized small bowel. No abnormally distended
small bowel or visualized colon.

Vascular/Lymphatic: Unremarkable vasculature.  No adenopathy

Other: No abdominal hernia. No surgical changes of the midline
abdomen

Musculoskeletal: No acute displaced fracture
IMPRESSION: No acute finding of the abdomen.

Enteric tube terminates near the pylorus.

## 2019-09-03 IMAGING — XA PERC PLACEMENT GASTROSTOMY
1 series · 4 of 4 positions shown · non-contrast
Comparison: none

INDICATION: 27-year-old with dysphagia, respiratory failure and encephalopathy.
Patient needs a percutaneous gastrostomy tube for nutrition.

[Series 300: dsa body · 4 of 4 slices shown]
[im 1/4]
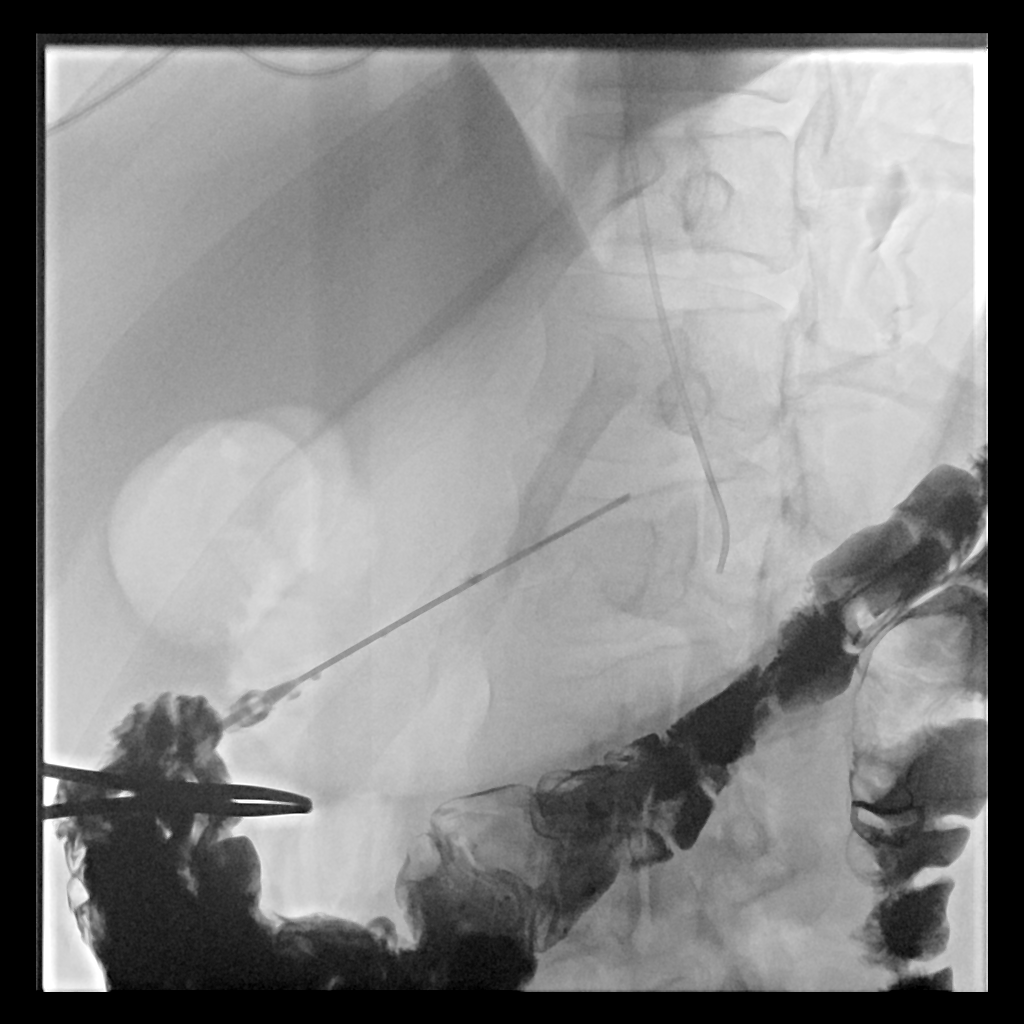
[im 2/4]
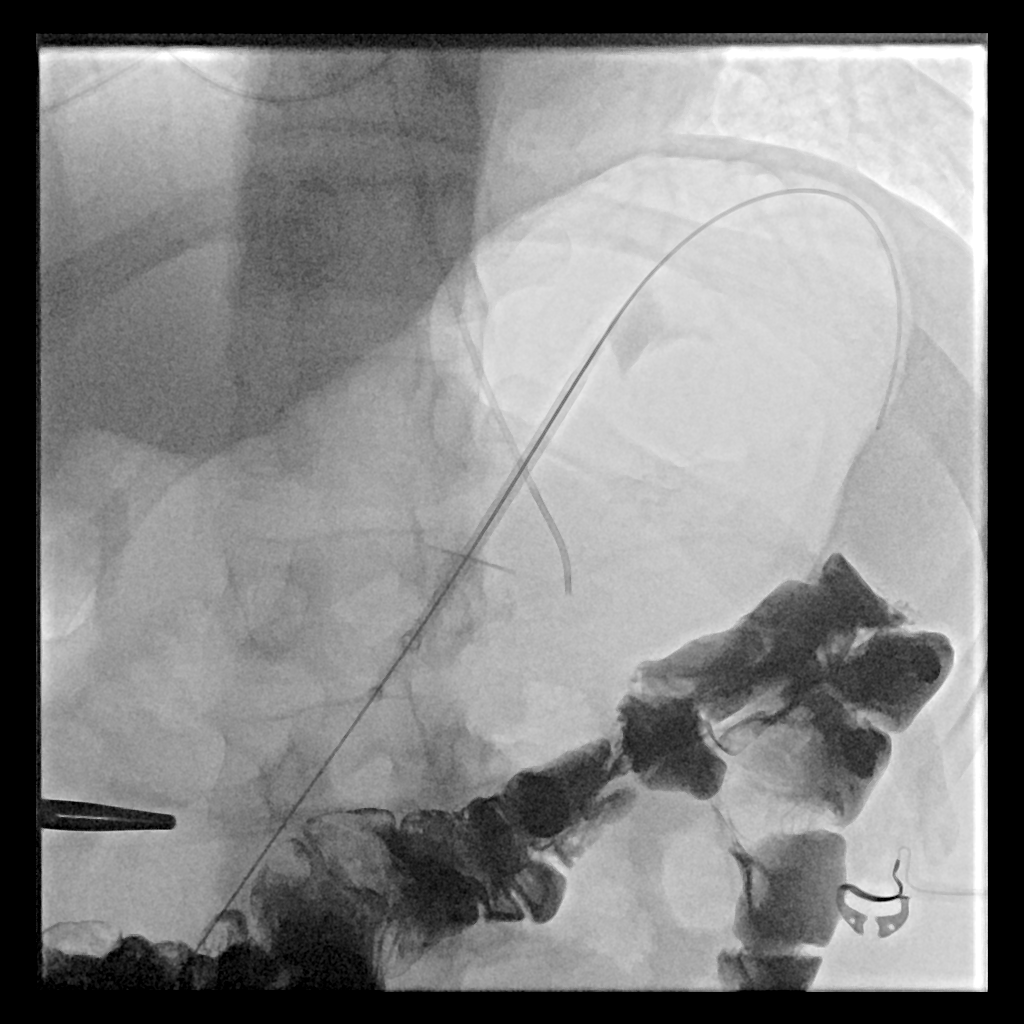
[im 3/4]
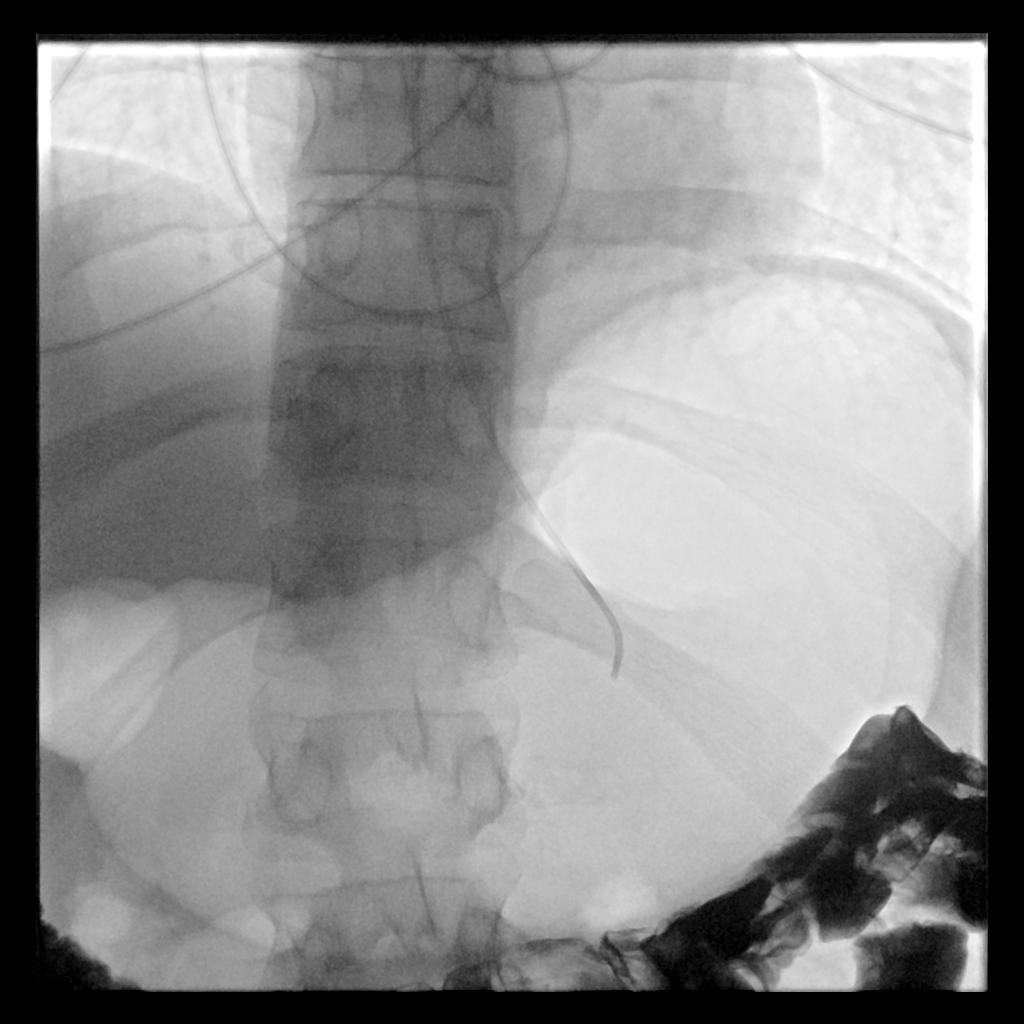
[im 4/4]
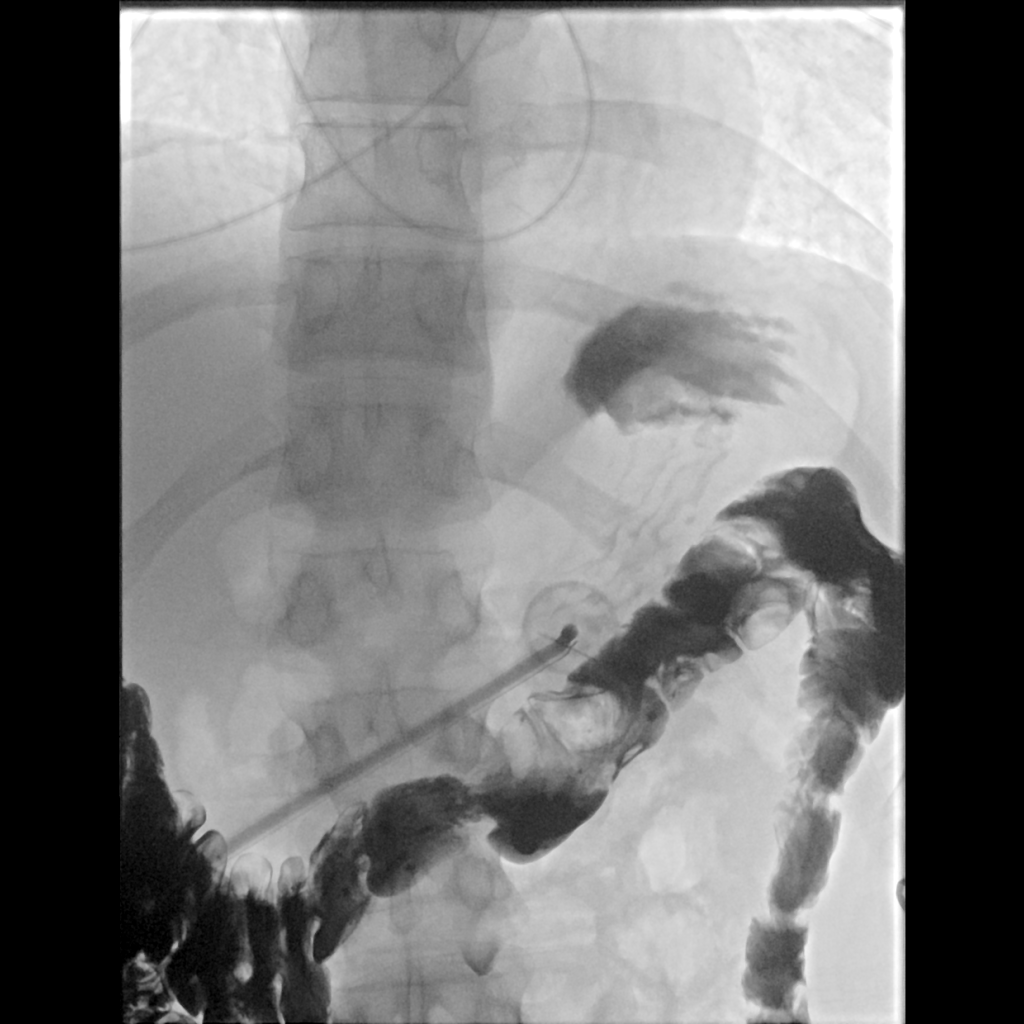

[4 of 4 positions shown; findings below may reference images not displayed]

EXAM:
PERCUTANEOUS GASTROSTOMY TUBE WITH FLUOROSCOPIC GUIDANCE

MEDICATIONS:
Ancef 2 g; Antibiotics were administered within 1 hour of the
procedure.

ANESTHESIA/SEDATION:
Versed 2.0 mg IV; Fentanyl 100 mcg IV

Moderate Sedation Time:  29 minutes

The patient was continuously monitored during the procedure by the
interventional radiology nurse under my direct supervision.

FLUOROSCOPY TIME:  Fluoroscopy Time: 4 minutes, 48 seconds, 9 mGy

COMPLICATIONS:
None immediate.

PROCEDURE:
Informed consent was obtained for a percutaneous gastrostomy tube.
The patient was placed on the interventional table. Fluoroscopy
demonstrated oral contrast in the transverse colon. An orogastric
tube was placed with fluoroscopic guidance. The anterior abdomen was
prepped and draped in sterile fashion. Maximal barrier sterile
technique was utilized including caps, mask, sterile gowns, sterile
gloves, sterile drape, hand hygiene and skin antiseptic. Stomach was
inflated with air through the orogastric tube. The skin and
subcutaneous tissues were anesthetized with 1% lidocaine. A 17 gauge
needle was directed into the distended stomach with fluoroscopic
guidance. A wire was advanced into the stomach and Morsik Tasak was
deployed. A 9-French vascular sheath was placed and the orogastric
tube was snared using a [place] snare device. The orogastric tube
and snare were pulled out of the patient's mouth. The snare device
was connected to a 20-French gastrostomy tube. The snare device and
gastrostomy tube were pulled through the patient's mouth and out the
anterior abdominal wall. The gastrostomy tube was cut to an
appropriate length. Contrast injection through gastrostomy tube
confirmed placement within the stomach. Fluoroscopic images were
obtained for documentation. The gastrostomy tube was flushed with
normal saline.
IMPRESSION: Successful fluoroscopic guided percutaneous gastrostomy tube
placement.
